# Patient Record
Sex: Male | Born: 1960 | Race: White | Hispanic: No | Marital: Single | Smoking: Current every day smoker
Health system: Southern US, Community
[De-identification: ages and names within clinical notes are randomized; demographics above are authoritative.]

## PROBLEM LIST (undated history)

## (undated) DIAGNOSIS — M1990 Unspecified osteoarthritis, unspecified site: Secondary | ICD-10-CM

## (undated) DIAGNOSIS — J449 Chronic obstructive pulmonary disease, unspecified: Secondary | ICD-10-CM

## (undated) DIAGNOSIS — E785 Hyperlipidemia, unspecified: Secondary | ICD-10-CM

## (undated) DIAGNOSIS — F32A Depression, unspecified: Secondary | ICD-10-CM

## (undated) DIAGNOSIS — F329 Major depressive disorder, single episode, unspecified: Secondary | ICD-10-CM

## (undated) DIAGNOSIS — I10 Essential (primary) hypertension: Secondary | ICD-10-CM

## (undated) DIAGNOSIS — F419 Anxiety disorder, unspecified: Secondary | ICD-10-CM

## (undated) HISTORY — PX: BACK SURGERY: SHX140

## (undated) HISTORY — DX: Chronic obstructive pulmonary disease, unspecified: J44.9

## (undated) HISTORY — DX: Essential (primary) hypertension: I10

## (undated) HISTORY — DX: Hyperlipidemia, unspecified: E78.5

## (undated) HISTORY — PX: NECK SURGERY: SHX720

## (undated) HISTORY — DX: Unspecified osteoarthritis, unspecified site: M19.90

## (undated) HISTORY — PX: ORTHOPEDIC SURGERY: SHX850

---

## 1998-05-29 ENCOUNTER — Emergency Department (HOSPITAL_COMMUNITY): Admission: EM | Admit: 1998-05-29 | Discharge: 1998-05-29 | Payer: Self-pay | Admitting: Emergency Medicine

## 1998-06-29 ENCOUNTER — Emergency Department (HOSPITAL_COMMUNITY): Admission: EM | Admit: 1998-06-29 | Discharge: 1998-06-29 | Payer: Self-pay | Admitting: Emergency Medicine

## 1998-07-01 ENCOUNTER — Emergency Department (HOSPITAL_COMMUNITY): Admission: EM | Admit: 1998-07-01 | Discharge: 1998-07-01 | Payer: Self-pay | Admitting: Emergency Medicine

## 1998-07-29 ENCOUNTER — Emergency Department (HOSPITAL_COMMUNITY): Admission: EM | Admit: 1998-07-29 | Discharge: 1998-07-29 | Payer: Self-pay | Admitting: Emergency Medicine

## 1999-07-05 ENCOUNTER — Emergency Department (HOSPITAL_COMMUNITY): Admission: EM | Admit: 1999-07-05 | Discharge: 1999-07-05 | Payer: Self-pay

## 1999-07-28 ENCOUNTER — Emergency Department (HOSPITAL_COMMUNITY): Admission: EM | Admit: 1999-07-28 | Discharge: 1999-07-28 | Payer: Self-pay

## 1999-08-05 ENCOUNTER — Emergency Department (HOSPITAL_COMMUNITY): Admission: EM | Admit: 1999-08-05 | Discharge: 1999-08-05 | Payer: Self-pay | Admitting: Emergency Medicine

## 1999-08-13 ENCOUNTER — Inpatient Hospital Stay (HOSPITAL_COMMUNITY): Admission: AD | Admit: 1999-08-13 | Discharge: 1999-08-14 | Payer: Self-pay | Admitting: *Deleted

## 1999-08-13 ENCOUNTER — Emergency Department (HOSPITAL_COMMUNITY): Admission: EM | Admit: 1999-08-13 | Discharge: 1999-08-13 | Payer: Self-pay

## 2000-02-29 ENCOUNTER — Emergency Department (HOSPITAL_COMMUNITY): Admission: EM | Admit: 2000-02-29 | Discharge: 2000-03-01 | Payer: Self-pay | Admitting: Emergency Medicine

## 2000-03-03 ENCOUNTER — Emergency Department (HOSPITAL_COMMUNITY): Admission: EM | Admit: 2000-03-03 | Discharge: 2000-03-03 | Payer: Self-pay | Admitting: Emergency Medicine

## 2000-03-04 ENCOUNTER — Encounter: Payer: Self-pay | Admitting: Emergency Medicine

## 2000-06-05 ENCOUNTER — Emergency Department (HOSPITAL_COMMUNITY): Admission: EM | Admit: 2000-06-05 | Discharge: 2000-06-06 | Payer: Self-pay | Admitting: Emergency Medicine

## 2000-06-08 ENCOUNTER — Emergency Department (HOSPITAL_COMMUNITY): Admission: EM | Admit: 2000-06-08 | Discharge: 2000-06-08 | Payer: Self-pay | Admitting: *Deleted

## 2000-06-12 ENCOUNTER — Emergency Department (HOSPITAL_COMMUNITY): Admission: EM | Admit: 2000-06-12 | Discharge: 2000-06-12 | Payer: Self-pay | Admitting: Emergency Medicine

## 2000-10-24 ENCOUNTER — Emergency Department (HOSPITAL_COMMUNITY): Admission: EM | Admit: 2000-10-24 | Discharge: 2000-10-24 | Payer: Self-pay | Admitting: Emergency Medicine

## 2001-03-02 ENCOUNTER — Emergency Department (HOSPITAL_COMMUNITY): Admission: EM | Admit: 2001-03-02 | Discharge: 2001-03-02 | Payer: Self-pay | Admitting: Emergency Medicine

## 2001-05-05 ENCOUNTER — Emergency Department (HOSPITAL_COMMUNITY): Admission: EM | Admit: 2001-05-05 | Discharge: 2001-05-05 | Payer: Self-pay | Admitting: Emergency Medicine

## 2001-05-05 ENCOUNTER — Encounter: Payer: Self-pay | Admitting: Emergency Medicine

## 2001-05-25 ENCOUNTER — Emergency Department (HOSPITAL_COMMUNITY): Admission: EM | Admit: 2001-05-25 | Discharge: 2001-05-25 | Payer: Self-pay

## 2001-06-01 ENCOUNTER — Encounter: Payer: Self-pay | Admitting: Emergency Medicine

## 2001-06-01 ENCOUNTER — Emergency Department (HOSPITAL_COMMUNITY): Admission: EM | Admit: 2001-06-01 | Discharge: 2001-06-02 | Payer: Self-pay | Admitting: Emergency Medicine

## 2001-06-14 ENCOUNTER — Emergency Department (HOSPITAL_COMMUNITY): Admission: EM | Admit: 2001-06-14 | Discharge: 2001-06-14 | Payer: Self-pay | Admitting: Internal Medicine

## 2001-08-05 ENCOUNTER — Emergency Department (HOSPITAL_COMMUNITY): Admission: EM | Admit: 2001-08-05 | Discharge: 2001-08-05 | Payer: Self-pay | Admitting: Emergency Medicine

## 2001-11-06 ENCOUNTER — Emergency Department (HOSPITAL_COMMUNITY): Admission: EM | Admit: 2001-11-06 | Discharge: 2001-11-06 | Payer: Self-pay | Admitting: Emergency Medicine

## 2001-11-12 ENCOUNTER — Emergency Department (HOSPITAL_COMMUNITY): Admission: EM | Admit: 2001-11-12 | Discharge: 2001-11-12 | Payer: Self-pay | Admitting: Emergency Medicine

## 2001-11-13 ENCOUNTER — Encounter: Payer: Self-pay | Admitting: Emergency Medicine

## 2001-11-13 ENCOUNTER — Emergency Department (HOSPITAL_COMMUNITY): Admission: EM | Admit: 2001-11-13 | Discharge: 2001-11-13 | Payer: Self-pay | Admitting: Emergency Medicine

## 2001-11-16 ENCOUNTER — Emergency Department (HOSPITAL_COMMUNITY): Admission: EM | Admit: 2001-11-16 | Discharge: 2001-11-17 | Payer: Self-pay | Admitting: Emergency Medicine

## 2001-11-24 ENCOUNTER — Emergency Department (HOSPITAL_COMMUNITY): Admission: EM | Admit: 2001-11-24 | Discharge: 2001-11-24 | Payer: Self-pay | Admitting: Emergency Medicine

## 2001-11-26 ENCOUNTER — Encounter: Payer: Self-pay | Admitting: Emergency Medicine

## 2001-11-26 ENCOUNTER — Emergency Department (HOSPITAL_COMMUNITY): Admission: EM | Admit: 2001-11-26 | Discharge: 2001-11-27 | Payer: Self-pay | Admitting: Emergency Medicine

## 2002-03-11 ENCOUNTER — Encounter: Payer: Self-pay | Admitting: Emergency Medicine

## 2002-03-11 ENCOUNTER — Emergency Department (HOSPITAL_COMMUNITY): Admission: EM | Admit: 2002-03-11 | Discharge: 2002-03-11 | Payer: Self-pay | Admitting: Emergency Medicine

## 2002-10-29 ENCOUNTER — Emergency Department (HOSPITAL_COMMUNITY): Admission: EM | Admit: 2002-10-29 | Discharge: 2002-10-29 | Payer: Self-pay

## 2002-11-05 ENCOUNTER — Encounter: Payer: Self-pay | Admitting: Emergency Medicine

## 2002-11-05 ENCOUNTER — Emergency Department (HOSPITAL_COMMUNITY): Admission: EM | Admit: 2002-11-05 | Discharge: 2002-11-05 | Payer: Self-pay | Admitting: Emergency Medicine

## 2002-12-01 ENCOUNTER — Encounter: Admission: RE | Admit: 2002-12-01 | Discharge: 2002-12-01 | Payer: Self-pay | Admitting: Cardiology

## 2002-12-01 ENCOUNTER — Encounter: Payer: Self-pay | Admitting: Cardiology

## 2003-01-06 ENCOUNTER — Encounter: Payer: Self-pay | Admitting: Oral Surgery

## 2003-01-06 ENCOUNTER — Ambulatory Visit (HOSPITAL_COMMUNITY): Admission: RE | Admit: 2003-01-06 | Discharge: 2003-01-06 | Payer: Self-pay | Admitting: Oral Surgery

## 2003-02-19 ENCOUNTER — Emergency Department (HOSPITAL_COMMUNITY): Admission: EM | Admit: 2003-02-19 | Discharge: 2003-02-19 | Payer: Self-pay

## 2003-03-02 ENCOUNTER — Inpatient Hospital Stay (HOSPITAL_COMMUNITY): Admission: EM | Admit: 2003-03-02 | Discharge: 2003-03-07 | Payer: Self-pay | Admitting: Emergency Medicine

## 2003-03-02 ENCOUNTER — Encounter: Payer: Self-pay | Admitting: Emergency Medicine

## 2003-03-04 ENCOUNTER — Encounter: Payer: Self-pay | Admitting: Cardiology

## 2003-03-05 ENCOUNTER — Encounter: Payer: Self-pay | Admitting: Cardiovascular Disease

## 2003-03-06 ENCOUNTER — Encounter (INDEPENDENT_AMBULATORY_CARE_PROVIDER_SITE_OTHER): Payer: Self-pay | Admitting: Cardiovascular Disease

## 2003-03-31 ENCOUNTER — Inpatient Hospital Stay (HOSPITAL_COMMUNITY): Admission: EM | Admit: 2003-03-31 | Discharge: 2003-04-03 | Payer: Self-pay | Admitting: *Deleted

## 2003-03-31 ENCOUNTER — Encounter: Payer: Self-pay | Admitting: *Deleted

## 2003-04-01 ENCOUNTER — Encounter: Payer: Self-pay | Admitting: Internal Medicine

## 2003-04-02 ENCOUNTER — Encounter: Payer: Self-pay | Admitting: Pulmonary Disease

## 2003-04-06 ENCOUNTER — Emergency Department (HOSPITAL_COMMUNITY): Admission: EM | Admit: 2003-04-06 | Discharge: 2003-04-06 | Payer: Self-pay | Admitting: Emergency Medicine

## 2003-04-06 ENCOUNTER — Encounter: Payer: Self-pay | Admitting: Emergency Medicine

## 2003-05-01 ENCOUNTER — Inpatient Hospital Stay (HOSPITAL_COMMUNITY): Admission: EM | Admit: 2003-05-01 | Discharge: 2003-05-02 | Payer: Self-pay | Admitting: Emergency Medicine

## 2003-05-27 ENCOUNTER — Encounter: Payer: Self-pay | Admitting: Emergency Medicine

## 2003-05-27 ENCOUNTER — Emergency Department (HOSPITAL_COMMUNITY): Admission: AD | Admit: 2003-05-27 | Discharge: 2003-05-27 | Payer: Self-pay | Admitting: Emergency Medicine

## 2003-06-02 ENCOUNTER — Inpatient Hospital Stay (HOSPITAL_COMMUNITY): Admission: EM | Admit: 2003-06-02 | Discharge: 2003-06-09 | Payer: Self-pay | Admitting: Psychiatry

## 2003-06-23 ENCOUNTER — Encounter: Payer: Self-pay | Admitting: Emergency Medicine

## 2003-06-23 ENCOUNTER — Emergency Department (HOSPITAL_COMMUNITY): Admission: EM | Admit: 2003-06-23 | Discharge: 2003-06-23 | Payer: Self-pay | Admitting: Emergency Medicine

## 2003-06-28 ENCOUNTER — Emergency Department (HOSPITAL_COMMUNITY): Admission: EM | Admit: 2003-06-28 | Discharge: 2003-06-28 | Payer: Self-pay | Admitting: Emergency Medicine

## 2003-06-28 ENCOUNTER — Encounter: Payer: Self-pay | Admitting: Emergency Medicine

## 2003-07-01 ENCOUNTER — Emergency Department (HOSPITAL_COMMUNITY): Admission: EM | Admit: 2003-07-01 | Discharge: 2003-07-01 | Payer: Self-pay | Admitting: Emergency Medicine

## 2003-07-01 ENCOUNTER — Encounter: Payer: Self-pay | Admitting: Emergency Medicine

## 2003-07-02 ENCOUNTER — Emergency Department (HOSPITAL_COMMUNITY): Admission: EM | Admit: 2003-07-02 | Discharge: 2003-07-02 | Payer: Self-pay | Admitting: Emergency Medicine

## 2003-08-26 ENCOUNTER — Emergency Department (HOSPITAL_COMMUNITY): Admission: AD | Admit: 2003-08-26 | Discharge: 2003-08-26 | Payer: Self-pay | Admitting: Emergency Medicine

## 2003-08-26 ENCOUNTER — Encounter: Payer: Self-pay | Admitting: Emergency Medicine

## 2004-02-03 ENCOUNTER — Emergency Department (HOSPITAL_COMMUNITY): Admission: EM | Admit: 2004-02-03 | Discharge: 2004-02-03 | Payer: Self-pay | Admitting: Emergency Medicine

## 2004-02-14 ENCOUNTER — Emergency Department (HOSPITAL_COMMUNITY): Admission: EM | Admit: 2004-02-14 | Discharge: 2004-02-15 | Payer: Self-pay | Admitting: Emergency Medicine

## 2004-02-17 ENCOUNTER — Emergency Department (HOSPITAL_COMMUNITY): Admission: EM | Admit: 2004-02-17 | Discharge: 2004-02-17 | Payer: Self-pay | Admitting: Emergency Medicine

## 2004-03-09 ENCOUNTER — Emergency Department (HOSPITAL_COMMUNITY): Admission: EM | Admit: 2004-03-09 | Discharge: 2004-03-09 | Payer: Self-pay | Admitting: Emergency Medicine

## 2004-03-14 ENCOUNTER — Emergency Department (HOSPITAL_COMMUNITY): Admission: EM | Admit: 2004-03-14 | Discharge: 2004-03-15 | Payer: Self-pay | Admitting: Emergency Medicine

## 2004-03-20 ENCOUNTER — Emergency Department (HOSPITAL_COMMUNITY): Admission: EM | Admit: 2004-03-20 | Discharge: 2004-03-20 | Payer: Self-pay | Admitting: Family Medicine
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
## 2004-04-13 IMAGING — CR DG CHEST 2V
2 series · 2 of 2 positions shown · non-contrast
Comparison: none

CLINICAL DATA: Chest pain, fever.
 TWO-VIEW CHEST, 02/17/04
 Comparison 02/14/04.
 The lungs are clear and the heart and mediastinal structures are normal.
 IMPRESSION 
 No evidence for active chest disease.

[view not recorded (1 of 2)]
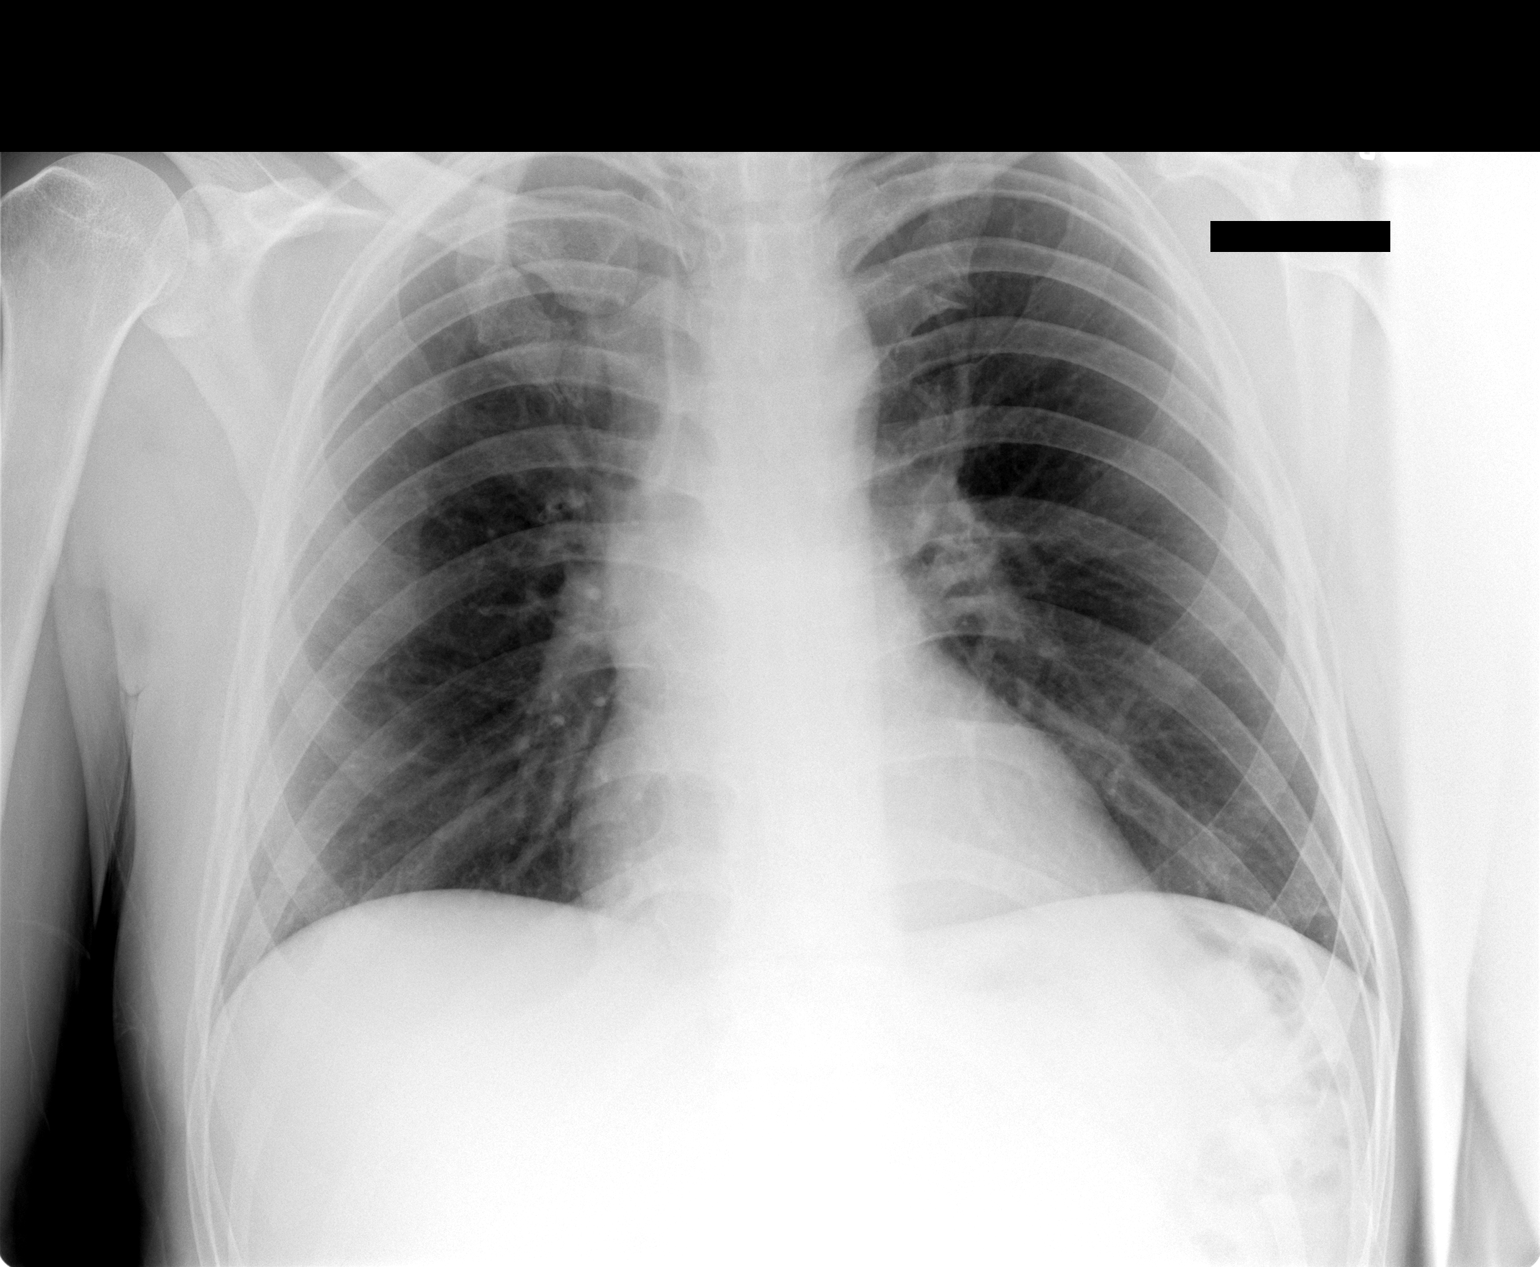

[view not recorded (2 of 2)]
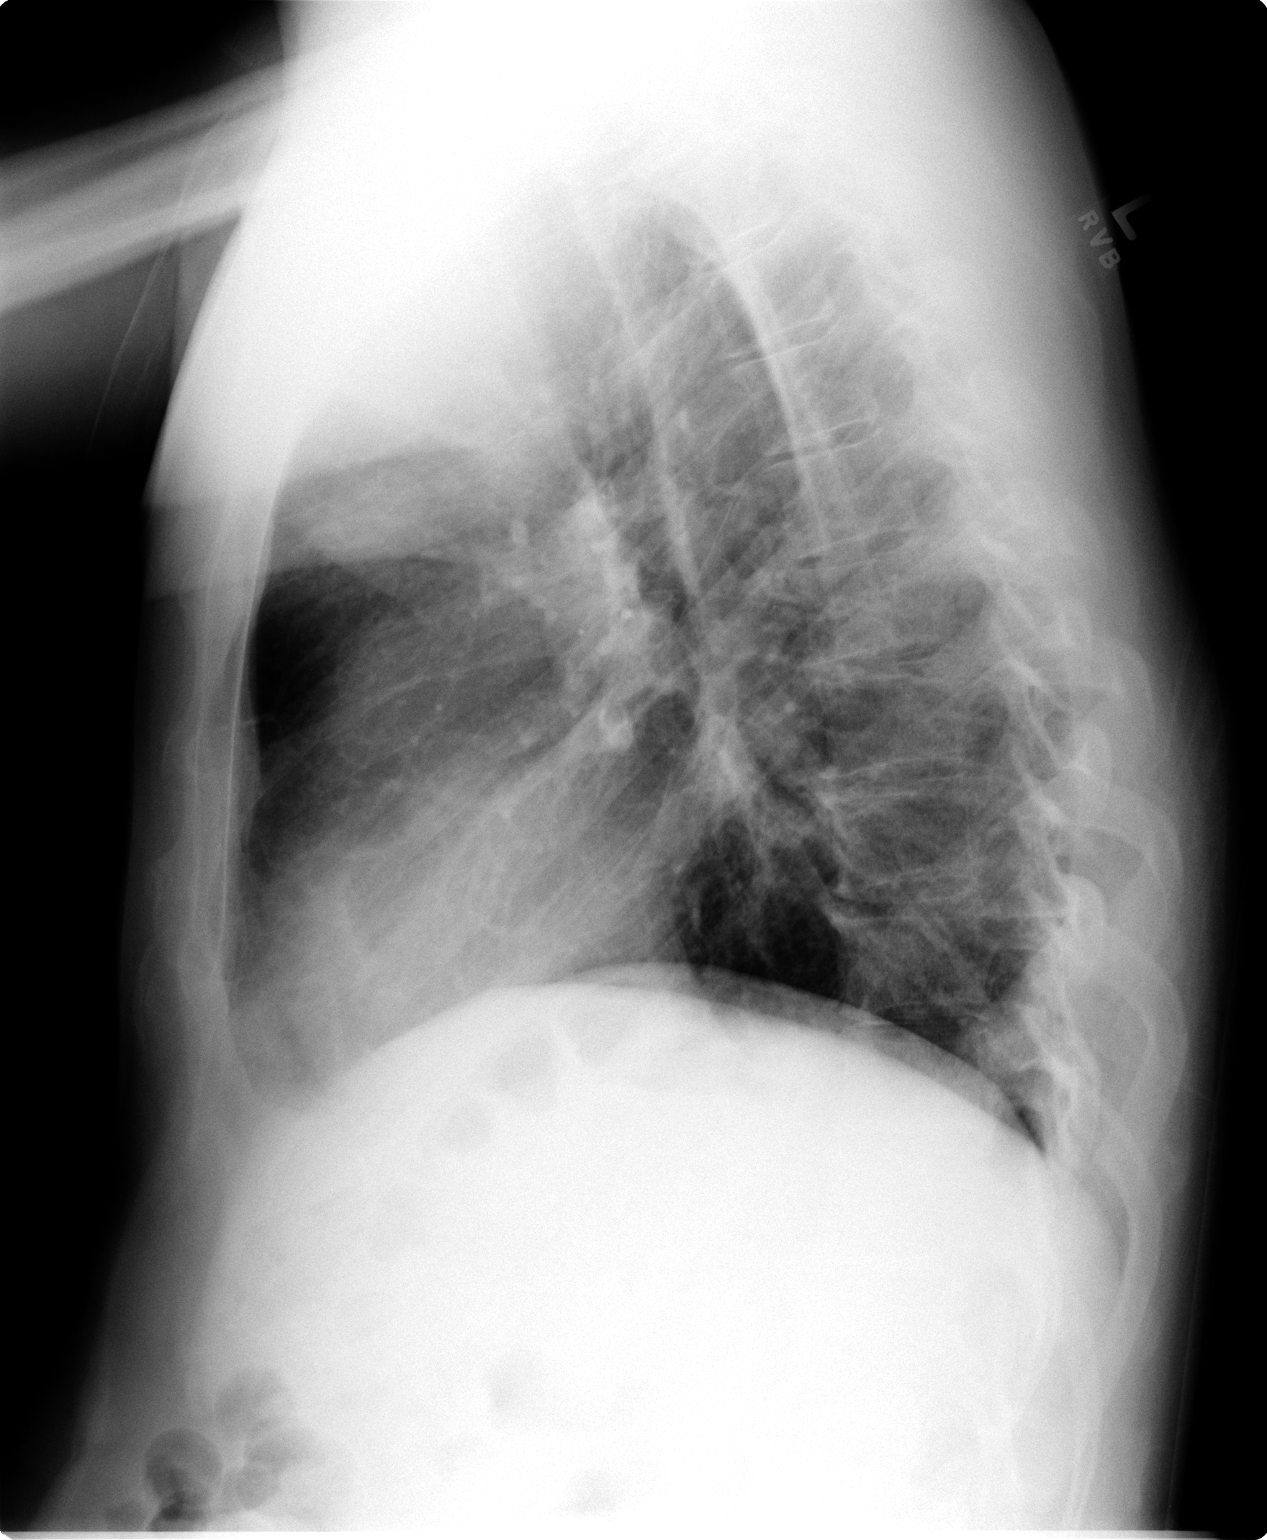

[2 of 2 positions shown; findings below may reference images not displayed]

## 2004-04-30 ENCOUNTER — Inpatient Hospital Stay (HOSPITAL_COMMUNITY): Admission: EM | Admit: 2004-04-30 | Discharge: 2004-05-02 | Payer: Self-pay

## 2004-05-02 ENCOUNTER — Inpatient Hospital Stay (HOSPITAL_COMMUNITY): Admission: RE | Admit: 2004-05-02 | Discharge: 2004-05-13 | Payer: Self-pay | Admitting: Psychiatry

## 2004-05-15 ENCOUNTER — Emergency Department (HOSPITAL_COMMUNITY): Admission: EM | Admit: 2004-05-15 | Discharge: 2004-05-15 | Payer: Self-pay | Admitting: Emergency Medicine

## 2004-07-27 ENCOUNTER — Emergency Department (HOSPITAL_COMMUNITY): Admission: EM | Admit: 2004-07-27 | Discharge: 2004-07-27 | Payer: Self-pay | Admitting: Emergency Medicine

## 2005-03-04 ENCOUNTER — Ambulatory Visit: Payer: Self-pay | Admitting: Gastroenterology

## 2005-03-21 ENCOUNTER — Encounter (INDEPENDENT_AMBULATORY_CARE_PROVIDER_SITE_OTHER): Payer: Self-pay | Admitting: Specialist

## 2005-03-21 ENCOUNTER — Ambulatory Visit (HOSPITAL_COMMUNITY): Admission: RE | Admit: 2005-03-21 | Discharge: 2005-03-21 | Payer: Self-pay | Admitting: Obstetrics and Gynecology

## 2005-07-22 ENCOUNTER — Emergency Department (HOSPITAL_COMMUNITY): Admission: EM | Admit: 2005-07-22 | Discharge: 2005-07-22 | Payer: Self-pay | Admitting: Family Medicine

## 2005-08-30 ENCOUNTER — Emergency Department (HOSPITAL_COMMUNITY): Admission: EM | Admit: 2005-08-30 | Discharge: 2005-08-31 | Payer: Self-pay | Admitting: Emergency Medicine

## 2006-02-15 ENCOUNTER — Emergency Department (HOSPITAL_COMMUNITY): Admission: EM | Admit: 2006-02-15 | Discharge: 2006-02-15 | Payer: Self-pay | Admitting: Emergency Medicine

## 2008-06-08 ENCOUNTER — Ambulatory Visit (HOSPITAL_BASED_OUTPATIENT_CLINIC_OR_DEPARTMENT_OTHER): Admission: RE | Admit: 2008-06-08 | Discharge: 2008-06-08 | Payer: Self-pay | Admitting: Internal Medicine

## 2009-01-07 ENCOUNTER — Emergency Department (HOSPITAL_COMMUNITY): Admission: EM | Admit: 2009-01-07 | Discharge: 2009-01-07 | Payer: Self-pay | Admitting: Emergency Medicine

## 2009-03-27 ENCOUNTER — Emergency Department (HOSPITAL_COMMUNITY): Admission: EM | Admit: 2009-03-27 | Discharge: 2009-03-28 | Payer: Self-pay | Admitting: Emergency Medicine

## 2009-03-28 ENCOUNTER — Ambulatory Visit: Payer: Self-pay | Admitting: Psychiatry

## 2009-03-28 ENCOUNTER — Inpatient Hospital Stay (HOSPITAL_COMMUNITY): Admission: RE | Admit: 2009-03-28 | Discharge: 2009-04-05 | Payer: Self-pay | Admitting: Psychiatry

## 2009-10-07 ENCOUNTER — Emergency Department (HOSPITAL_BASED_OUTPATIENT_CLINIC_OR_DEPARTMENT_OTHER): Admission: EM | Admit: 2009-10-07 | Discharge: 2009-10-07 | Payer: Self-pay | Admitting: Emergency Medicine

## 2010-09-20 ENCOUNTER — Inpatient Hospital Stay (HOSPITAL_COMMUNITY): Admission: EM | Admit: 2010-09-20 | Discharge: 2010-09-22 | Payer: Self-pay | Admitting: Emergency Medicine

## 2011-02-05 LAB — SALICYLATE LEVEL: Salicylate Lvl: <4 mg/dL (ref 2.8–20.0)

## 2011-02-05 LAB — ACETAMINOPHEN LEVEL: Acetaminophen (Tylenol), Serum: <10 ug/mL — ABNORMAL LOW (ref 10–30)

## 2011-02-05 LAB — URINALYSIS, ROUTINE W REFLEX MICROSCOPIC
Bilirubin Urine: NEGATIVE
Hgb urine dipstick: NEGATIVE
Specific Gravity, Urine: 1.01 (ref 1.005–1.030)
Urobilinogen, UA: 0.2 mg/dL (ref 0.0–1.0)

## 2011-02-05 LAB — ETHANOL: Alcohol, Ethyl (B): 81 mg/dL — ABNORMAL HIGH (ref 0–10)

## 2011-02-05 LAB — POCT I-STAT, CHEM 8
BUN: 7 mg/dL (ref 6–23)
Calcium, Ion: 1.11 mmol/L — ABNORMAL LOW (ref 1.12–1.32)
Chloride: 104 mEq/L (ref 96–112)
Glucose, Bld: 94 mg/dL (ref 70–99)
Potassium: 4.3 mEq/L (ref 3.5–5.1)
Sodium: 138 mEq/L (ref 135–145)

## 2011-02-05 LAB — CARDIAC PANEL(CRET KIN+CKTOT+MB+TROPI)
CK, MB: 54.4 ng/mL (ref 0.3–4.0)
Relative Index: 0.6 (ref 0.0–2.5)
Relative Index: 1 (ref 0.0–2.5)
Troponin I: <0.01 ng/mL (ref 0.00–0.06)

## 2011-02-05 LAB — BASIC METABOLIC PANEL
BUN: 9 mg/dL (ref 6–23)
CO2: 26 mEq/L (ref 19–32)
Calcium: 8.3 mg/dL — ABNORMAL LOW (ref 8.4–10.5)
Creatinine, Ser: 0.76 mg/dL (ref 0.4–1.5)
GFR calc Af Amer: >60 mL/min (ref >60)
GFR calc non Af Amer: >60 mL/min (ref >60)
Sodium: 144 mEq/L (ref 135–145)

## 2011-02-05 LAB — POCT CARDIAC MARKERS
CKMB, poc: 34.1 ng/mL (ref 1.0–8.0)
Myoglobin, poc: >500 ng/mL (ref 12–200)

## 2011-02-05 LAB — CBC
Hemoglobin: 13.8 g/dL (ref 13.0–17.0)
MCV: 93.4 fL (ref 78.0–100.0)
Platelets: 190 10*3/uL (ref 150–400)
WBC: 8.1 10*3/uL (ref 4.0–10.5)

## 2011-02-05 LAB — COMPREHENSIVE METABOLIC PANEL
AST: 78 U/L — ABNORMAL HIGH (ref 0–37)
Albumin: 3.3 g/dL — ABNORMAL LOW (ref 3.5–5.2)
BUN: 10 mg/dL (ref 6–23)
Calcium: 8.5 mg/dL (ref 8.4–10.5)
GFR calc non Af Amer: >60 mL/min (ref >60)
Sodium: 139 mEq/L (ref 135–145)

## 2011-02-05 LAB — RAPID URINE DRUG SCREEN, HOSP PERFORMED
Barbiturates: NOT DETECTED
Tetrahydrocannabinol: NOT DETECTED

## 2011-03-04 LAB — DIFFERENTIAL
Basophils Absolute: 0 10*3/uL (ref 0.0–0.1)
Basophils Relative: 1 % (ref 0–1)
Eosinophils Relative: 1 % (ref 0–5)
Monocytes Absolute: 0.6 10*3/uL (ref 0.1–1.0)

## 2011-03-04 LAB — COMPREHENSIVE METABOLIC PANEL
AST: 62 U/L — ABNORMAL HIGH (ref 0–37)
Albumin: 3.8 g/dL (ref 3.5–5.2)
Alkaline Phosphatase: 66 U/L (ref 39–117)
BUN: 1 mg/dL — ABNORMAL LOW (ref 6–23)
CO2: 21 mEq/L (ref 19–32)
Chloride: 105 mEq/L (ref 96–112)
GFR calc Af Amer: >60 mL/min (ref >60)
Potassium: 4 mEq/L (ref 3.5–5.1)
Total Bilirubin: 0.6 mg/dL (ref 0.3–1.2)

## 2011-03-04 LAB — URINALYSIS, ROUTINE W REFLEX MICROSCOPIC
Bilirubin Urine: NEGATIVE
Bilirubin Urine: NEGATIVE
Glucose, UA: NEGATIVE mg/dL
Specific Gravity, Urine: 1.001 — ABNORMAL LOW (ref 1.005–1.030)
Specific Gravity, Urine: 1.024 (ref 1.005–1.030)
Urobilinogen, UA: 0.2 mg/dL (ref 0.0–1.0)
pH: 6 (ref 5.0–8.0)
pH: 6.5 (ref 5.0–8.0)

## 2011-03-04 LAB — URINE MICROSCOPIC-ADD ON

## 2011-03-04 LAB — CBC
HCT: 46.4 % (ref 39.0–52.0)
Platelets: 180 10*3/uL (ref 150–400)
RBC: 5.01 MIL/uL (ref 4.22–5.81)
WBC: 5.8 10*3/uL (ref 4.0–10.5)

## 2011-03-04 LAB — ETHANOL: Alcohol, Ethyl (B): 311 mg/dL — ABNORMAL HIGH (ref 0–10)

## 2011-03-04 LAB — RAPID URINE DRUG SCREEN, HOSP PERFORMED: Benzodiazepines: POSITIVE — AB

## 2011-03-11 LAB — URINALYSIS, ROUTINE W REFLEX MICROSCOPIC
Glucose, UA: NEGATIVE mg/dL
Ketones, ur: NEGATIVE mg/dL
Protein, ur: NEGATIVE mg/dL

## 2011-03-11 LAB — RAPID URINE DRUG SCREEN, HOSP PERFORMED
Amphetamines: NOT DETECTED
Barbiturates: NOT DETECTED
Benzodiazepines: POSITIVE — AB

## 2011-03-11 LAB — POCT I-STAT, CHEM 8
Calcium, Ion: 0.98 mmol/L — ABNORMAL LOW (ref 1.12–1.32)
Glucose, Bld: 113 mg/dL — ABNORMAL HIGH (ref 70–99)
HCT: 48 % (ref 39.0–52.0)
Hemoglobin: 16.3 g/dL (ref 13.0–17.0)
Potassium: 3.5 mEq/L (ref 3.5–5.1)
TCO2: 20 mmol/L (ref 0–100)

## 2011-04-08 NOTE — H&P (Signed)
NAME:  Jason Bean, Jason Bean NO.:  [id number]   MEDICAL RECORD NO.:  [id number]          PATIENT TYPE:  IPS   LOCATION:  0507                          FACILITY:  [facility]   PHYSICIAN:  Geoffery Lyons, M.D.      DATE OF BIRTH:  [birth year]   DATE OF ADMISSION:  03/28/2009  DATE OF DISCHARGE:                       PSYCHIATRIC ADMISSION ASSESSMENT   HISTORY OF PRESENT ILLNESS:  The patient reports a history of alcohol  use, been drinking a lot with reporting up to a case per day.  Also a  history of seizures a couple months ago, related to alcohol withdrawal.  Last use of alcohol was a day prior to admission.  He does report some  depression and suicidal thoughts.  The patient's stressor is he is  currently homeless.   PAST PSYCHIATRIC HISTORY:  The patient was here in 2005 with a history  of crack and methamphetamine use.  He reports a history bipolar  disorder, although not currently on any medications at this time.   SOCIAL HISTORY:  The patient is, again, a 50 year old single male,  homeless, currently on disability, has a ninth-grade education.   FAMILY HISTORY:  Unknown.   ALCOHOL AND DRUG HISTORY:  Again the patient has been drinking up to  case a day with last drink day prior to admission.  History of seizures.  No other recreational drug use.   PRIMARY CARE Akirah Storck:  None.   MEDICAL PROBLEMS:  Again, seizure disorder and hypertension.   MEDICATIONS:  Listed as lisinopril 10 mg and Xanax.   DRUG ALLERGIES:  CODEINE and RISPERDAL.   PHYSICAL FINDINGS:  This is a well-nourished, middle-aged male, assessed  at [facility] Emergency Department.  Noted the patient initially had  decreased O2 sats of 88-92, had some wheezing noted, concern for  aspiration.  Chest x-ray was within normal limits.  Also noted to have  slurred speech, intoxicated breath, smelling of alcohol.  He did receive  a banana bag.  Again, other pertinent symptoms, the patient became angry  and  ripped out his IV.  He was also unable to maintain any conversation  without falling asleep.  His temperature is 98.5, 106 heart rate, 20  respirations, blood pressure is 118/77.  The patient presented to the  unit very sleepy, but today awakens very readily and with good eye-  contact, able to provide some history.  He denies any tremors or any  other physical complaints.   LABORATORY DATA:  Shows acetaminophen level is less than 10.  Alcohol  level was 311.  Urine drug screen positive for cocaine, positive for  benzodiazepines.  Urinalysis shows WBC 11-20.  Chest x-ray was within  normal limits.  SGOT is mildly elevated at 62.   MENTAL STATUS EXAM:  Again, the patient is resting in bed.  He easily  awakens and has good eye-contact.  Speech is soft-spoken.  Mood is  depressed.  The patient's affect is flat.  Thought processes are  coherent.  No delusional statements.  His cognitive function is intact.  Again memory appears intact.  Judgment and insight are limited.  AXIS I:  Substance-induced mood disorder.  Alcohol abuse, rule out  dependence.  Polysubstance abuse.  AXIS II:  Deferred.  AXIS III:  Seizure disorder and hypertension.  AXIS IV:  Problems with housing, psychosocial problems, chronic  substance use and lack of social support.  AXIS V:  Current is 35.   PLAN:  Our plan is to put patient on Librium protocol, monitor  withdrawal symptoms.  We will put patient on seizure precautions.  Will  work on relapse prevention.  Case manager will assess rehab programs  available to the patient.  Will continue to assess comorbidities.  The  patient may benefit from an antidepressant.  His tentative length of  stay at this time is 4-6 days.      Landry Corporal, N.P.      Geoffery Lyons, M.D.  Electronically Signed    JO/MEDQ  D:  03/28/2009  T:  03/28/2009  Job:  [id number]

## 2011-04-11 NOTE — Discharge Summary (Signed)
NAME:  Jason Bean, Jason Bean NO.:  [id number]   MEDICAL RECORD NO.:  [id number]          PATIENT TYPE:  IPS   LOCATION:  0507                          FACILITY:  [facility]   PHYSICIAN:  Geoffery Lyons, M.D.      DATE OF BIRTH:  04-04-1961   DATE OF ADMISSION:  03/28/2009  DATE OF DISCHARGE:  04/05/2009                               DISCHARGE SUMMARY   CHIEF COMPLAINT:  This was the second admission to [facility] for 50 year old male that was admitted with history of alcohol  dependence, endorsed drinking a lot, reporting up to a case per day,  history of seizures related to alcohol withdrawal, last use of alcohol  the day before the admission.  Endorsed some depression and thoughts of  suicide.  He was homeless, financial difficulties, cannot keep a place  to leave.  He hears big birds and weird things sometimes at night, also  hears people talking when they are not there.   PAST PSYCHIATRIC HISTORY:  Was in Behavior Health in 2005 with a history  of crack and methamphetamine use.  Has been diagnosed bipolar, on no  medication.   ALCOHOL AND DRUG HISTORY:  As already stated, persistent use of alcohol  and withdrawal seizures and past history of crack and methamphetamine  abuse.   MEDICAL HISTORY:  Hypertension, seizure upon withdrawal.   MEDICATIONS:  Claims takes lisinopril and Xanax.   PHYSICAL EXAMINATION:  Failed to show any acute findings.   LABORATORY WORKUP:  Alcohol level 311.  UDS positive for cocaine and  benzodiazepines.  White blood cells 11-20.  Chest x-ray was within  normal limits.  SGOT 62.   MENTAL STATUS EXAM:  Reveals alert cooperative male in bed, easily  awakened, good eye contact.  Speech is soft spoken, was depressed,  affect constricted.  Thought processes are clear, coherent.  No suicidal  or homicidal ideas, no delusions, no hallucinations.  Cognition well  preserved.   ADMISSION DIAGNOSES:  AXIS I:  Alcohol dependence.   Cocaine abuse, rule  out substance-induced mood disorder.  AXIS II:  No diagnosis.  AXIS III:  Seizure upon withdrawal.  Hypertension.  AXIS IV:  Moderate.  AXIS V:  On admission 35; highest GAF in last year 50.   COURSE IN THE HOSPITAL:  Was admitted, started in individual and group  psychotherapy.  Detoxified with Librium.  He was given some Neurontin,  eventually Neurontin was discontinued.  As already stated, a 50 year old  male, history of alcohol dependence, increased use up to 12 beers a day.  May 6, still detoxing, lightheaded, uncertain as far as where he was  going to go from the hospital.  He has used benzodiazepines, Paxil,  Zoloft, Lexapro, Celexa, Prozac, Cymbalta.  Endorsed decreased sleep,  decreased appetite, anxiety.  Gave him a trial with Neurontin.  He  admitted that he had been using a lot Xanax for sleep.  We increased the  Neurontin, gave him some trazodone.  May 9, he had slept better.  May  10, he was feeling nauseated, shaky, as when he came  in, and we ruled  out the possibility of side effects from the Neurontin, so we extended  the Librium and we stopped the Neurontin.  May 11, he was better,  decreasing tremulousness, decreasing the shaky feeling.  We pursued  medications.  We continued to detox with Librium.  He was stabilized at  25 three times a day.  He felt much better.  Stopped the withdrawal.  The assessment was that probably was going to have to be detoxed on a  longer term basis from benzodiazepines.  May 13, obtained full benefit  from hospitalization.  He was going to go to a [employer] and  continue to work towards getting to [place] where he was going to be  able to secure a permanent placement.  Meanwhile, he was going to be  seen at [facility].   DISCHARGE DIET:  AXIS I:  Alcohol dependence.  Alcohol withdrawal.  Benzodiazepine dependence.  Benzodiazepine withdrawal.  Anxiety disorder  not otherwise  specified.  Substance-induced depression.  AXIS II:  No diagnosis.  AXIS III:  Withdrawal seizures.  Hypertension.  AXIS IV:  Moderate.  AXIS V:  On discharge 50-55.   Discharged on Librium 25 one 3 times a day for 4 days; then Librium 25  one twice a day for 4 days; then Librium 25 one daily for 4 days.   FOLLOW-UP:  Through [facility].      Geoffery Lyons, M.D.  Electronically Signed     IL/MEDQ  D:  04/26/2009  T:  04/26/2009  Job:  [id number]

## 2011-04-11 NOTE — H&P (Signed)
NAME:  Jason Bean, Jason NO.:  [id number]   MEDICAL RECORD NO.:  [id number]                   PATIENT TYPE:  IPS   LOCATION:  0503                                 FACILITY:  [facility]   PHYSICIAN:  Geoffery Lyons, M.D.                   DATE OF BIRTH:  June 23, 1961   DATE OF ADMISSION:  06/02/2003  DATE OF DISCHARGE:                         PSYCHIATRIC ADMISSION ASSESSMENT   TYPE OF ADMISSION:  This is an involuntary commitment.  This is a 50-year-  old white male.  He was admitted involuntarily for alcohol dependence,  requesting detoxification.  He also reported depression with suicidal  thoughts and a plan to cut his wrist and walk in front of a car.  He is now  drinking five to six 40 ounce beers daily and using crack.  Review of his  hospital record indicates that he was admitted to [facility] on April 8, was  discharged on March 08, 2003 of this year.  He had been seen in the  emergency room complaining of agitation because he had been on an alcohol  binge.  He was treated with Xanax and Klonopin and found to have stimulant  poisoning and toxic effect, cocaine abuse, alcohol abuse, and generalized  anxiety.  He also complained of GI problems at that time.  An upper GI study  was obtained which revealed no issues and he was evaluated by the behavioral  health team.  He was agreeable to go into the residential polysubstance  abuse treatment program at [facility] in [place] and when  discharged on March 07, 2003 from the hospital, he was to go to rehab.  At  the time of his discharge in April, he was on Cardizem CD 120 mg one p.o.  q.d., Protonix 40 mg a day, and Xanax 0.5 t.i.d.  The patient was readmitted  on Mar 31, 2003, status post alcohol and benzodiazepine overdose.  He  suffered respiratory failure secondary to this.  He was intubated and weaned  off the ventilator on the 8th.  He was seen by Dr. Jeanie Sewer in consult who  felt that  although Mr. Pape had a significant substance abuse problem, he  was able to go home.  The patient also reported that he had a panic disorder  and that he should go home with a small amount of benzodiazepine.  At no  time was the patient suicidal and hence he was discharged on May 10th.  His  immediate issue today is that he is homeless.  He has no place to go and he  has continued to abuse alcohol and crack cocaine.   PAST PSYCHIATRIC HISTORY:  He acknowledges several prior admissions to [facility].   SOCIAL HISTORY:  He completed 9th grade.  He has worked in Aeronautical engineer.  He  never married, but he does have a 44 year old daughter.  His father  died two  years ago.  Initially the patient inhabited that house until he was evicted  due to foreclosure.  He was going to live with his mother, however there is  not enough bedrooms at his mother's apartment.  He was not allowed to stay  and hence he has been homeless the past 2 1/2 weeks.   FAMILY HISTORY:  He denies any history for mental illness.   ALCOHOL AND DRUG HISTORY:  He has an extensive and significant polysubstance  abuse problem.   MEDICAL HISTORY AND PRIMARY CARE Ronelle Smallman:  He used to see a Dr. Jettie Pagan  who is no longer in practice.  He has seen Dr. Donia Guiles, cardiologist,  the last two inpatient admissions.  Currently he is not taking any  medication.   DRUG ALLERGIES:  No known drug allergies.   POSITIVE PHYSICAL FINDINGS:  He does have a mild tremor bilaterally.  HEENT:  Was within normal limits.  He does need to have some dental care.  LUNGS:  Clear to auscultation and percussion.  HEART:  Revealed a regular rate and rhythm without murmurs, rubs, or  gallops.  ABDOMEN:  Soft with no hepatosplenomegaly.  MUSCULOSKELETAL:  Reveals no clubbing, cyanosis, or edema.  NEUROLOGIC:  Cranial nerves II-XII are intact.  He has no significant  alteration in his gait.   MENTAL STATUS EXAM:  Shows he is alert  and oriented x3.  He is unkempt,  although cooperative.  His speech had a normal rate, rhythm, and tone.  His  mood is irritable.  His thought processes are coherent.  Specifically, he  requests continued prescription of his benzodiazepine for his anxiety.  Concentration and memory are fair.  Judgment and insight are poor.  His  intelligence level is average.   DIAGNOSES:   AXIS I:  1. Polysubstance abuse and dependence.  2. Anxiety disorder per history.   AXIS II:  Rule out personality disorder.   AXIS III:  Recent respiratory failure secondary to alcohol and benzo  overdose.   AXIS IV:  Severe.   AXIS V:  35.   PLAN:  To support through withdrawal using the low dose Librium protocol.  He will be directly admitted to a residential treatment for polysubstance  abuse after his detox is complete.  Significantly, his alcohol level on  arrival was 351.     Mickie Leonarda Salon, P.A.-C.               Geoffery Lyons, M.D.    MD/MEDQ  D:  06/04/2003  T:  06/04/2003  Job:  [id number]

## 2011-04-11 NOTE — Discharge Summary (Signed)
NAME:  Jason Bean, Jason Bean NO.:  [id number]   MEDICAL RECORD NO.:  [id number]                   PATIENT TYPE:  IPS   LOCATION:  0503                                 FACILITY:  [facility]   PHYSICIAN:  Jeanice Lim, M.D.              DATE OF BIRTH:  Jan 07, 1961   DATE OF ADMISSION:  06/02/2003  DATE OF DISCHARGE:  06/09/2003                                 DISCHARGE SUMMARY   IDENTIFYING DATA:  This is a 50 year old single Caucasian male admitted  involuntarily for alcohol dependence, requesting detox.  Also, depressed  with suicidal thoughts and plan to cut wrist or walk in front of a car.  Homeless.  Drinking five to six 40-ounce beers daily.  Also using crack  cocaine.   MEDICATIONS:  None.   ALLERGIES:  No known drug allergies.   PHYSICAL EXAMINATION:  GENERAL:  Physical exam is essentially within normal  limits.  NEUROLOGICAL:  Nonfocal.  Patient was tremulous.   MENTAL STATUS EXAM:  Alert and oriented, unkempt, cooperative.  Speech  within normal limits.  Mood irritable, dysphoric.  Affect restricted.  Thought process goal-directed.  Thought content negative for psychotic  symptoms, possible suicidal thoughts.  Cognitively intact.  Judgment and  insight poor to limited.   ADMISSION DIAGNOSES:   AXIS I:  1. Polysubstance abuse.  2. Anxiety disorder, not otherwise specified.   AXIS II:  Deferred.   AXIS III:  Recent respiratory failure secondary to alcohol and  benzodiazepine overdose.   AXIS IV:  Moderate problems with primary support group, educational,  occupation, housing, economic stressors, and legal problems.   AXIS V:  30/55.   HOSPITAL COURSE:  Patient was admitted, ordered routine p.r.n. medications,  underwent further monitoring, was encouraged to participate in the  individual, group, and milieu therapy.  Patient wanted residential substance  abuse treatment and was monitored for detox and withdrawal.  Patient  reported  responding to Klonopin, Seroquel p.r.n.  Seroquel was optimized,  Risperdal ordered for severe panic, and Trileptal and Seroquel were  optimized.  Patient has been placed on low-dosed Librium detox protocol.  Risperdal and Neurontin were discontinued, and patient reported a positive  response to medication changes, participated in after-care planning.   CONDITION AT DISCHARGE:  Markedly improved.  Mood was more euthymic, affect  brighter, thought process goal-directed, thought content negative for  dangerous ideation or psychotic symptoms.  Patient reported motivation to be  compliant with the after-care plan, showed improved judgment and insight.  Patient was discharged on medications.   DISCHARGE MEDICATIONS:  1. Seroquel 25 mg two tabs three times per day and 300 mg q.h.s.  2. Trileptal 300 mg one-half three times per day and one q.h.s.  3. Trazodone 100 mg q.h.s. p.r.n.  4. Librium 25 mg b.i.d. and two q.h.s.   FOLLOW UP:  1. Patient is to follow up on Monday, June 26, 2003, at  5 p.m. with Donnie Aho.  2. [facility] on June 13, 2003, at 6 p.m. for medication     management.   DISCHARGE DIAGNOSES:   AXIS I:  1. Polysubstance abuse.  2. Anxiety disorder, not otherwise specified.   AXIS II:  Deferred.   AXIS III:  Recent respiratory failure secondary to alcohol and  benzodiazepine overdose.   AXIS IV:  Moderate problems with primary support group, educational,  occupation, housing, economic stressors, and legal problems.   AXIS V:  Global assessment of functioning on discharge was 55.                                               Jeanice Lim, M.D.    JEM/MEDQ  D:  06/26/2003  T:  06/27/2003  Job:  [id number]

## 2011-04-11 NOTE — Discharge Summary (Signed)
NAME:  AARIK, BLANK NO.:  [id number]   MEDICAL RECORD NO.:  [id number]                   PATIENT TYPE:  INP   LOCATION:  4733                                 FACILITY:  [facility]   PHYSICIAN:  Osvaldo Shipper. Spruill, M.D.             DATE OF BIRTH:  03/05/1961   DATE OF ADMISSION:  03/02/2003  DATE OF DISCHARGE:  03/07/2003                                 DISCHARGE SUMMARY   DISCHARGE DIAGNOSES:  1. Stimulant poisoning and toxic effect.  2. Cocaine abuse.  3. Alcohol abuse.  4. Chest pain.  5. Tobacco use.  6. Generalized anxiety.   HISTORY OF PRESENT ILLNESS:  This is a 50 year old patient who complained of  chest pain.  Stated he awakened with this particular problem.  He had been  smoking crack for the last five days.  He complained of being shaky.  He  states he has also been on an alcohol binge.  The patient was seen initially  in the emergency department of the [facility].  He received Ativan  IV for assistance with these problems.  His evaluation revealed a normal  CBC, an alcohol level that was less than 5, a CK that was normal at 158 with  an MB normal at 0.7.  He had a troponin that was normal at 0.01.  His urine  drug screen revealed benzodiazepine.  The patient was subsequently admitted  for evaluation of the chest pain and assistance with the polysubstance  abuse.   The patient was treated with Xanax and Klonopin.  He had an adenosine  Cardiolite study done.  His electrocardiograms revealed a normal sinus  rhythm with sinus arrhythmia, some ST abnormality.  The adenosine Cardiolite  study revealed an ejection fraction of 51%.  No perfusion defect was noted.  The patient also underwent a transthoracic 2-D echocardiogram which revealed  left ventricular systolic function to be normal.  This test also projected  the ejection fraction to be 55%-65%.  There was mild calcification of the  aortic valve and very mild aortic valve  stenosis.  There was also some mild  regurgitation in the aortic area and mild mitral regurgitation.  No other  significant problems were noted.   The patient was also seen by the ACT team.  The patient related to them that  he was interested in going to a residential setting for polysubstance abuse  and treatment.  The various options were discussed with the patient.  The  patient was referred to behavioral health.   The patient also complained of some GI-related problems.  An upper GI study  was obtained which revealed no obstructing lesions or mass effect or ulcer.  There was noted some minimal reflux present and no hiatal hernia.   The behavioral health team was able to be in touch with the [facility] in [place].  The patient was discharged on March 07, 2003.  The  patient is to follow up with the physician in [place] for his drug  rehab.  He is to be followed very closely as an outpatient.  He is to be  seen in the office in two weeks.  Plans are still underway to assist the  patient in obtaining rehab for his polysubstance abuse.   DISCHARGE MEDICATIONS:  1. Cardizem CD 120 one daily.  2. Protonix 40 mg daily.  3. Xanax 0.5 mg three times daily.   DISCHARGE INSTRUCTIONS:  He is to notify the physician immediately with any  changes or problems or concerns.     Ivery Quale, P.A.                       Osvaldo Shipper. Spruill, M.D.    HB/MEDQ  D:  05/04/2003  T:  05/05/2003  Job:  [id number]

## 2011-04-11 NOTE — Discharge Summary (Signed)
   NAME:  Jason Bean, Jason Bean NO.:  [id number]   MEDICAL RECORD NO.:  [id number]                   PATIENT TYPE:  INP   LOCATION:  3032                                 FACILITY:  [facility]   PHYSICIAN:  Lazaro Arms, M.D.        DATE OF BIRTH:  [birth year]   DATE OF ADMISSION:  03/31/2003  DATE OF DISCHARGE:  04/03/2003                                 DISCHARGE SUMMARY   PRIMARY CARE PHYSICIAN:  Osvaldo Shipper. Spruill, M.D.   DISCHARGE DIAGNOSES:  1. Status post alcohol and benzodiazepine overdose.  2. Status post respiratory failure secondary to #1.  3. History of polysubstance abuse.  4. History of noncardiac chest pain.   BRIEF HISTORY OF PRESENT ILLNESS:  The patient is a 50 year old gentleman  who presented to [facility] [place] with a loss of consciousness.  He  admitted to taking Xanax, Ativan, crack cocaine and a large amount of  alcohol.  He was intubated due to respiratory depression.  He was initially  put into the ICU.  Critical care medicine was consulted and he was quickly  weaned off of the ventilator by Apr 01, 2003.  The patient was then  transferred to the floor on suicide precautions and was placed on a tapering  schedule of Librium.  He remained without any signs or symptoms of  benzodiazepine or alcohol withdrawal.  On Apr 03, 2003, he was seen by Antonietta Breach, M.D., of psychiatry who felt that he had a significant substance  abuse problem but that he was safe to go home.  He said that he also had a  panic disorder and so he should go home with a small amount of  benzodiazepine.  At no time during this hospitalization was the patient  suicidal, although he was placed on suicide precautions until he was seen by  Antonietta Breach, M.D.  The patient was discharged home in stable condition  Apr 03, 2003.  He was febrile, pulse 73, blood pressure 100 systolic.                                               Lazaro Arms,  M.D.    AMC/MEDQ  D:  04/03/2003  T:  04/04/2003  Job:  [id number]   cc:   Osvaldo Shipper. Spruill, M.D.  [address] [address]  [place]  [place] [address]  Fax: [phone]

## 2011-04-11 NOTE — Discharge Summary (Signed)
NAME:  Jason Bean, Jason Bean                         ACCOUNT NO.:  [id number]   MEDICAL RECORD NO.:  [id number]                   PATIENT TYPE:  INP   LOCATION:  4703                                 FACILITY:  [facility]   PHYSICIAN:  C. Ulyess Mort, M.D.             DATE OF BIRTH:  Jun 24, 1961   DATE OF ADMISSION:  04/30/2004  DATE OF DISCHARGE:  05/01/2004                                 DISCHARGE SUMMARY   DISCHARGE DIAGNOSES:  1. Chest pain secondary to drug abuse.  2. Polysubstance abuse including cocaine, opiates, and methamphetamine.  3. Depression with suicidal ideation.  4. Tobacco abuse.  5. Likely gastroesophageal reflux disease.   DISCHARGE MEDICATIONS:  1. Klonopin 1 mg p.o. t.i.d.  2. Thiamine 100 mg p.o. daily.  3. Folic acid 1 mg p.o. daily.  4. Protonix 40 mg p.o. daily.  5. Lexapro 10 mg p.o. daily.  6. Nicoderm CQ 21 mg patch transdermal to be changed every day.  7. Tylenol 650 mg p.o. q.4h. p.r.n. headache or pain.   HISTORY OF PRESENT ILLNESS:  Jason Bean is a 50 year old Caucasian male  with a significant past medical history  of psychiatric disease and  polysubstance abuse who presented to the emergency department with  complaints of burning chest pain radiating down his right arm.  The patient  does report some diaphoresis and shortness of breath with the chest pain;  however, that had since subsided by his presentation to the ED.  The patient  admits to using methamphetamine and cocaine prior to his chest pain.  In  addition, the patient reports drinking a significant amount of alcohol prior  to his onset of chest pain.  The patient reports that he was attempting to  kill himself by using the drugs. According to the patient, this is his third  suicide attempt.  Prior suicide attempts were by slitting his wrists and  taking pills.   Upon presentation to the ED, the patient's chest pain was substernal and  nonradiating.  It was not associated with nausea,  vomiting, diaphoresis, or  shortness of breath.  The patient was thus admitted to rule out a myocardial  infarction or cardiac origin of his chest pain and to be cleared for medical  stabilization prior to transfer for psychiatric help.   Of note, the patient does indicate a history of withdrawal, presumably from  alcohol and/or opiates, and has a history of seizures with his withdrawal.   PHYSICAL EXAMINATION:  VITAL SIGNS:  Temperature 98.2, blood pressure 94.69,  pulse 99, O2 saturation 95% on room air, respiratory rate 20.  GENERAL:  The patient is rather disheveled appearing but appropriate  interaction.  HEENT:  Nonicteric sclerae.  Pupils equal, round, and reactive to light.  Non-injected conjunctivae.  Oropharynx clear with somewhat dry mucous  membranes.  NECK:  Supple.  No thyromegaly, no lymphadenopathy, no JVD.  CHEST:  Clear to auscultation bilaterally with  good air movement.  CARDIOVASCULAR:  Regular rate and rhythm with normal S1 and S2.  ABDOMEN:  Tender right upper quadrant but soft and nondistended with some  decreased bowel sounds.  EXTREMITIES:  Normal capillary refill with no clubbing, cyanosis, or edema.  SKIN:  Warm and dry.  NEUROLOGIC:  Nonfocal.  PSYCHIATRIC:  Somewhat depressed and flat affect; however, the patient  provides appropriate and forthcoming responses to all questions.   LABORATORY AND X-RAY DATA:  1. Chest x-ray revealed no acute disease.  2. Cardiac enzymes were drawn q.8h. x 3 to rule out acute myocardial     infarction.  The patient's CK-MB and CK were all within normal limits.     The patient's troponin was less than 0.01 for all three sets of enzymes.  3. Urine drug screen was positive for cocaine and benzodiazepines.  Of note,     the urine drug screen was negative for methamphetamine.  4. Basic metabolic panel:  Sodium 142, potassium 3.9, chloride 109, bicarb     20, BUN 5, creatinine 0.9, glucose 97.  5  CBC: White blood cell count  7.1, hemoglobin 15.6, platelets 246, MCV  92.1.  1. Liver function profile all within normal limits.  2. EKG revealed normal sinus rhythm with a normal axis and nonspecific T     wave changes and no evidence of acute myocardial infarction or ischemia.   HOSPITAL COURSE:  #1.  CHEST PAIN:  As stated above, the patient was brought in to rule out  cardiac origin of chest pain.  Given the negative troponin q.8h. x 3 as well  as no EKG changes from admission or the following morning and improvement in  the patient's symptoms and no events on telemetry, the patient's source of  chest pain is most likely not cardiac, and he has been ruled out for acute  myocardial infarction.  The most likely source of his chest pain is some  reflux disease or a mild gastritis from alcohol use.  The patient was placed  on Protonix upon admission, and this should be continued upon transfer to  psychiatric facility and as an outpatient.   #2.  POLYSUBSTANCE ABUSE:  Given the history of seizures, the patient was  provided Ativan p.r.n.; however, he has not required any.  Up to time of  dictation, he has been clear mentation and has not been agitated or shown  any altered mental status to this date.  The patient is, however, at high  risk for withdrawal given his alcohol and drug abuse.  In addition upon  evaluation by psychiatry, the patient should receive proper treatment for  polysubstance abuse.  He states that he is willing and motivated to receive  substance abuse treatment at this time.   #3.  DEPRESSION WITH SUICIDAL IDEATION:  The patient was started on Lexapro  10 mg p.o. daily and continued on his Klonopin for anxiety.  The patient  states that he remains currently suicidal at time of dictation; however, he  does not state that there is an acute event that made him this way and has  been accumulation of multiple events in his past that has left to his suicidal ideation.  A sitter was provided with the  patient from the time of  admission up to time of discharge for suicide precautions.   #4.  TOBACCO ABUSE:  The patient was given a Nicoderm CQ patch to help  prevent nicotine withdrawal while in the hospital.  DISCHARGE DESTINATION:  The patient is to be discharged to an inpatient  psychiatric facility after evaluation by psychiatry.   SUGGESTED FOLLOWUP ITEMS:  1. Given the patient's significant history of substance abuse and history of     seizures, seizure precautions as well as withdrawal precautions and     Ativan may be required p.r.n. should the patient become agitated or begin     to exhibit signs of withdrawal.  2. The patient was empirically started on Lexapro; however, if a more     appropriate antidepressant is indicated by the inpatient psychiatric     team, there would be no objection to making that change by the patient's     primary team.   FOLLOW UP:  The patient's primary care Abbigail Anstey is Dr. Mayford Knife of [facility].  I believe he is the doctor who writes for the patient's  Klonopin.  Prior to discharge from the psychiatric facility, appropriate  followup to be arranged with the patient's primary care Quinette Hentges or any  other appropriate mental health Horace Lukas.   DIET:  The patient was tolerating regular diet at time of discharge.      Franchot Mimes, MD                         Gary Fleet, M.D.    TV/MEDQ  D:  05/01/2004  T:  05/01/2004  Job:  [id number]   cc:   Dr. Samuel Jester [facility]
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
## 2011-04-11 NOTE — Discharge Summary (Signed)
NAME:  Jason Bean NO.:  [id number]   MEDICAL RECORD NO.:  [id number]                   PATIENT TYPE:  IPS   LOCATION:  0503                                 [facility]   PHYSICIAN:  Osvaldo Shipper. Spruill, M.D.             DATE OF BIRTH:  03-Feb-1961   DATE OF ADMISSION:  06/02/2003  DATE OF DISCHARGE:  05/02/2003                                 DISCHARGE SUMMARY   DISCHARGE DIAGNOSES:  1. Benzodiazepine overdose.  2. Toxic effect of alcohol.  3. Suicidal ideation.  4. Alcohol dependence.  5. Panic disorder.   HISTORY OF PRESENT ILLNESS:  This is a 50 year old patient who was seen  initially in the emergency department of [facility] on May 01, 2003.  The patient is reported as to have ingested 15 Clonopin as well as  drinking a large amount of alcohol.  The family notified EMS and the patient  was brought to the emergency department.  The patient was observed and  treated and admitted for evaluation because of the possible side effects of  the medications that were taken.  Some mild respiratory decompensation was  noted while the patient was being observed and subsequently he was admitted.   HOSPITAL COURSE:  The patient was admitted to the medical service.  Plans  were made for psychiatric evaluation.  He attempted to sign out against  medical advice but because of his suicidal ideation this was prohibited.  The patient was seen by Dr. Jeanie Sewer with psychiatry.  The patient declined  inpatient alcohol and drug services.  It was the opinion of the psychiatric  consultant that the patient was not committable and the patient did agree to  see his outpatient psychiatrist on May 18, 2003 (associated with [facility]).   On May 02, 2003 the patient was much more sober and able to carry on a  conversation, could ambulate without problems.  He was subsequently  discharged home having received maximum benefit from his  hospitalization.   LABORATORY DATA:  The patient's arterial blood gas showed a pH of 7.32, PCO2  of 45.8, PO2 of 103 an O2 saturation of 98% on four liters of oxygen.  The  chemistries revealed the sodium to be normal at 143 and the potassium normal  at 3.8, chloride was normal at 109, CO2 normal at 26 and glucose normal at  103.  The BUN was 7, and creatinine 0.8 both of which were normal.  The  liver function studies were all within normal limits.  Acetaminophen was  less than 10 and salicylates was less than four.  The drug screen revealed  positive benzodiazepines.  The alcohol level was 191 with the normal limits  being 0 to 10.  The patient was also positive for tricyclics.   PLAN:  The patient is to be seen by [facility] and the  psychiatrist as an outpatient.  He is to notify the physician immediately  with any changes,  problems or concerns.  He has family support at this  point to assist him.     Ivery Quale, P.A.                       Osvaldo Shipper. Spruill, M.D.   HB/MEDQ  D:  06/07/2003  T:  06/07/2003  Job:  [id number]
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
## 2011-09-25 ENCOUNTER — Ambulatory Visit: Payer: Self-pay | Admitting: Internal Medicine

## 2011-10-02 ENCOUNTER — Ambulatory Visit: Payer: Self-pay | Admitting: Internal Medicine

## 2011-10-02 DIAGNOSIS — Z0289 Encounter for other administrative examinations: Secondary | ICD-10-CM

## 2012-01-06 ENCOUNTER — Ambulatory Visit (INDEPENDENT_AMBULATORY_CARE_PROVIDER_SITE_OTHER): Payer: PRIVATE HEALTH INSURANCE | Admitting: Family Medicine

## 2012-01-06 ENCOUNTER — Ambulatory Visit: Payer: PRIVATE HEALTH INSURANCE

## 2012-01-06 VITALS — BP 128/82 | HR 80 | Temp 98.1°F | Resp 16 | Ht 68.5 in | Wt 188.6 lb

## 2012-01-06 DIAGNOSIS — R059 Cough, unspecified: Secondary | ICD-10-CM

## 2012-01-06 DIAGNOSIS — R05 Cough: Secondary | ICD-10-CM

## 2012-01-06 DIAGNOSIS — I10 Essential (primary) hypertension: Secondary | ICD-10-CM | POA: Insufficient documentation

## 2012-01-06 DIAGNOSIS — M542 Cervicalgia: Secondary | ICD-10-CM

## 2012-01-06 DIAGNOSIS — J449 Chronic obstructive pulmonary disease, unspecified: Secondary | ICD-10-CM

## 2012-01-06 DIAGNOSIS — G8929 Other chronic pain: Secondary | ICD-10-CM

## 2012-01-06 DIAGNOSIS — E785 Hyperlipidemia, unspecified: Secondary | ICD-10-CM | POA: Insufficient documentation

## 2012-01-06 DIAGNOSIS — F419 Anxiety disorder, unspecified: Secondary | ICD-10-CM | POA: Insufficient documentation

## 2012-01-06 DIAGNOSIS — J209 Acute bronchitis, unspecified: Secondary | ICD-10-CM

## 2012-01-06 MED ORDER — AMOXICILLIN-POT CLAVULANATE 875-125 MG PO TABS: 1.0000 | ORAL_TABLET | Freq: Two times a day (BID) | ORAL | Status: AC

## 2012-01-06 MED ORDER — CITALOPRAM HYDROBROMIDE 20 MG PO TABS: 20.0000 mg | ORAL_TABLET | Freq: Every day | ORAL | Status: DC

## 2012-01-06 MED ORDER — OXYCODONE-ACETAMINOPHEN 5-325 MG PO TABS: 1.0000 | ORAL_TABLET | Freq: Four times a day (QID) | ORAL | Status: AC | PRN

## 2012-01-06 MED ORDER — AMOXICILLIN-POT CLAVULANATE 875-125 MG PO TABS: 1.0000 | ORAL_TABLET | Freq: Two times a day (BID) | ORAL | Status: DC

## 2012-01-06 NOTE — Progress Notes (Signed)
51 yo man with chronic neck pain for years dx'd as bulging disc in neck with cervical radiculitis on the right and low back pain.  He has been at chronic pain clinic in [facility].  Currently on oxycodone from Dr. Orrin Brigham.  Has recently undergone shots to neck.  Past EMG 09/19/2010 was normal.  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  In and out of pain clinics since.   Now has persistent cough with productive sputum brown colored.  No dyspea.  No chest pain.  No fevers.  O:  Appears slightly drugged.  NAD.  Cooperative HEENT:  Unremarkable Neck:  Decrease ROM, no JVD or bruit, no adenopathy or thyromegaly Skin:  Warm and dry Heart:  Reg without murmur or gallop Chest:  Bilateral ronchi  [facility] reading (PRIMARY) by  Dr. Milus Glazier - CXR chronic changes with hazy RLL.  Assessment:  COPD with acute bronchitis vs pneumonia Chronic pain syndrome in neck Anxiety.  P:  Augmentin 875 bid x 10 days Pain management referral Oxycodone 5 mg/325 qid x 1 month

## 2012-01-06 NOTE — Patient Instructions (Signed)
Return in one month.  

## 2012-01-18 ENCOUNTER — Telehealth: Payer: Self-pay

## 2012-01-18 NOTE — Telephone Encounter (Signed)
.  [facility] Pt called regarding his referral to [facility] Neurologist.  Pt states he is willing to come in for office visit if needed to discuss referral. Please call patient to review referral status at [phone].

## 2012-01-19 ENCOUNTER — Other Ambulatory Visit: Payer: Self-pay | Admitting: Family Medicine

## 2012-01-19 DIAGNOSIS — M5090 Cervical disc disorder, unspecified, unspecified cervical region: Secondary | ICD-10-CM

## 2012-01-20 ENCOUNTER — Ambulatory Visit (INDEPENDENT_AMBULATORY_CARE_PROVIDER_SITE_OTHER): Payer: PRIVATE HEALTH INSURANCE | Admitting: Family Medicine

## 2012-01-20 VITALS — BP 121/81 | HR 91 | Temp 98.9°F | Resp 18 | Ht 69.0 in | Wt 185.0 lb

## 2012-01-20 DIAGNOSIS — M549 Dorsalgia, unspecified: Secondary | ICD-10-CM

## 2012-01-20 DIAGNOSIS — M5090 Cervical disc disorder, unspecified, unspecified cervical region: Secondary | ICD-10-CM

## 2012-01-20 MED ORDER — GABAPENTIN 300 MG PO CAPS: 300.0000 mg | ORAL_CAPSULE | Freq: Three times a day (TID) | ORAL | Status: DC

## 2012-01-20 MED ORDER — OXYCODONE-ACETAMINOPHEN 10-325 MG PO TABS: 1.0000 | ORAL_TABLET | Freq: Three times a day (TID) | ORAL | Status: AC | PRN

## 2012-01-20 NOTE — Progress Notes (Signed)
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  He saw brain and spine surgeon in [place] who set up appointment in pain management over in [place].  Patient doesn't want to take pain pills the rest of his life.  He prefers some surgical correction.  The initial pain syndrome came after moving mulch in girlfriend's back yard. He had 2 lumbar disc surgeries in [facility] 2007, but pain persists and right sciatic symptoms persist. Has had 6 steroid shots in his neck so far which haven't helped.  O:  Patient is alert although speech is a bit slurred. Weak grasps, FROM both arms, DTR's diminished.  A:  Chronic pain syndrome  P:  Neurosurgical second opinion. Continued pain meds.

## 2012-02-03 ENCOUNTER — Ambulatory Visit (INDEPENDENT_AMBULATORY_CARE_PROVIDER_SITE_OTHER): Payer: PRIVATE HEALTH INSURANCE | Admitting: Family Medicine

## 2012-02-03 DIAGNOSIS — G8921 Chronic pain due to trauma: Secondary | ICD-10-CM

## 2012-02-03 DIAGNOSIS — G44209 Tension-type headache, unspecified, not intractable: Secondary | ICD-10-CM

## 2012-02-03 DIAGNOSIS — E119 Type 2 diabetes mellitus without complications: Secondary | ICD-10-CM

## 2012-02-03 DIAGNOSIS — J449 Chronic obstructive pulmonary disease, unspecified: Secondary | ICD-10-CM

## 2012-02-03 DIAGNOSIS — G8929 Other chronic pain: Secondary | ICD-10-CM

## 2012-02-03 LAB — GLUCOSE, POCT (MANUAL RESULT ENTRY): POC Glucose: 100

## 2012-02-03 MED ORDER — OXYCODONE-ACETAMINOPHEN 7.5-325 MG PO TABS: 1.0000 | ORAL_TABLET | ORAL | Status: AC | PRN

## 2012-02-03 MED ORDER — CLONAZEPAM 1 MG PO TABS: 1.0000 mg | ORAL_TABLET | Freq: Three times a day (TID) | ORAL | Status: DC | PRN

## 2012-02-03 NOTE — Progress Notes (Signed)
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  His CT and MRI were neg, but he was found to have diabetes.  He was kept a couple days and given insulin, then discharged one week ago.  He was only given a week or so of meds on discharge and was told to follow up with PCP.  He has no appetite at all  His neck continues to hurt despite Percocet.  He wants to stop xanax and switch to clonazepam.  Just using rescue inhaler.  Still smoking.  F/H COPD  O:  Alert and anxious.  Confused about what is going on. Chest:  Few exp wheezes Heart:  Reg, no murmur Abdomen:  Soft, nontender without HSM HEENT unremarkable Skin warm and dry Results for orders placed in visit on 02/03/12  GLUCOSE, POCT (MANUAL RESULT ENTRY)      Component Value Range   POC Glucose 100      A: glucose wellcontrolled, pain - not, I am not sure why this patient had recent vomiting and headaches. Apparently his MRI revealed no abnormalities and he now has a normal blood sugar. Thankfully, his symptoms have resolved other than the chronic neck pain. L1 see him back in a month. P: Patient has been referred to [facility] pain clinic Continue pain meds, will increase to 7.5 oxycodone  I've asked him to take just one metformin 500 mg daily until the next visit

## 2012-02-03 NOTE — Patient Instructions (Signed)
Recheck one month

## 2012-02-04 ENCOUNTER — Telehealth: Payer: Self-pay

## 2012-02-04 NOTE — Telephone Encounter (Signed)
Pt states that [facility] is going to see him 02/23/12 but he needs an mri that includes his neck all the way down his back  Please call patient

## 2012-02-06 ENCOUNTER — Other Ambulatory Visit: Payer: Self-pay | Admitting: Family Medicine

## 2012-02-06 DIAGNOSIS — M542 Cervicalgia: Secondary | ICD-10-CM

## 2012-02-12 ENCOUNTER — Ambulatory Visit: Payer: PRIVATE HEALTH INSURANCE | Admitting: Family Medicine

## 2012-02-12 DIAGNOSIS — K219 Gastro-esophageal reflux disease without esophagitis: Secondary | ICD-10-CM | POA: Insufficient documentation

## 2012-02-18 NOTE — Telephone Encounter (Signed)
PT CALLED TO CHECK ON STATUS OF MRI REFERRAL AND ALSO NEEDS REFILL OF OXYCODONE

## 2012-02-24 ENCOUNTER — Ambulatory Visit (INDEPENDENT_AMBULATORY_CARE_PROVIDER_SITE_OTHER): Payer: PRIVATE HEALTH INSURANCE | Admitting: Family Medicine

## 2012-02-24 DIAGNOSIS — E119 Type 2 diabetes mellitus without complications: Secondary | ICD-10-CM

## 2012-02-24 DIAGNOSIS — R51 Headache: Secondary | ICD-10-CM

## 2012-02-24 DIAGNOSIS — G4700 Insomnia, unspecified: Secondary | ICD-10-CM

## 2012-02-24 DIAGNOSIS — R05 Cough: Secondary | ICD-10-CM

## 2012-02-24 DIAGNOSIS — R059 Cough, unspecified: Secondary | ICD-10-CM

## 2012-02-24 LAB — GLUCOSE, POCT (MANUAL RESULT ENTRY): POC Glucose: 128

## 2012-02-24 MED ORDER — MOMETASONE FURO-FORMOTEROL FUM 200-5 MCG/ACT IN AERO: 2.0000 | INHALATION_SPRAY | Freq: Two times a day (BID) | RESPIRATORY_TRACT | Status: DC

## 2012-02-24 MED ORDER — OXYCODONE-ACETAMINOPHEN 10-325 MG PO TABS: 1.0000 | ORAL_TABLET | Freq: Three times a day (TID) | ORAL | Status: AC | PRN

## 2012-02-24 NOTE — Progress Notes (Signed)
S:  Headaches x 4 months despite the oxycodone 7.5 tid.  Has MRI scheduled for tomorrow. Also c/o persistent cough.  Unable to figure out how to use Advair.  Still smoking  O:  Alert Decreased range of motion in neck.  Tender left posterior base of c-spine Fundi:  Normal Chest:  Clear Oroph:  Geographic tongue Heart: S4 with early systolic ejection murmur Abdomen:  Smooth liver edge. Results for orders placed in visit on 02/24/12  GLUCOSE, POCT (MANUAL RESULT ENTRY)      Component Value Range   POC Glucose 128      A:  Poor pain control, insomnia, cough P: increase the oxycodone to 10 tid, try melatonin and dulera for the cough.

## 2012-02-25 ENCOUNTER — Other Ambulatory Visit: Payer: Self-pay | Admitting: Family Medicine

## 2012-02-25 ENCOUNTER — Ambulatory Visit
Admission: RE | Admit: 2012-02-25 | Discharge: 2012-02-25 | Disposition: A | Discharge status: Home / Self Care | Payer: Medicaid Other | Source: Ambulatory Visit | Attending: Family Medicine | Admitting: Family Medicine

## 2012-02-25 DIAGNOSIS — M5090 Cervical disc disorder, unspecified, unspecified cervical region: Secondary | ICD-10-CM

## 2012-02-25 DIAGNOSIS — M542 Cervicalgia: Secondary | ICD-10-CM

## 2012-02-25 NOTE — Telephone Encounter (Signed)
Pharmacist called to alert [place] that he is also on Ultram from another provider and they are concerned about our RX for Percocet.  Spoke to Dr. Milus Glazier - he can have either the Percocet or the Ultram.  He has enough disease process to require Percocet and has a referral to neurosurgery started.  Spoke with the pharmacist and told they that the patient can get either the Percocet or the Ultram and the other should be cancelled.  I have tried to call the patient on his # [phone] multiple times without an answer and no voice mail to leave a message.  If the patient calls back please give him this information.

## 2012-02-26 ENCOUNTER — Telehealth: Payer: Self-pay

## 2012-02-26 NOTE — Telephone Encounter (Signed)
Patient   [facility] [address] in [place] is having issues filling pain medication   Patient also wants to know who is going to be his surgeon   PLEASE ADVISE

## 2012-02-26 NOTE — Telephone Encounter (Signed)
Tried to call pt. No answer, no VM

## 2012-02-27 NOTE — Telephone Encounter (Signed)
Still no answer, no VM. Sent unable to reach letter with the info from Sarah.

## 2012-02-28 NOTE — Telephone Encounter (Signed)
Called patient to discuss, no answer or VM.

## 2012-03-01 NOTE — Telephone Encounter (Signed)
Tried to CB pt. No answer, no VM. Called [facility] to see if there is a problem w/his percocet Rx. Pharmacy reported that orig, they did not have enough for entire # and gave pt some to use until rest could be ordered. Pt has since p/up the rest. Lupita Leash, I am forwarding this to you to check on pt's referral. It looks like [place] neuro was waiting on add'l note to be sent. Have you sent what is needed, or do we still need to send them anything? Thank you for checking

## 2012-03-09 ENCOUNTER — Emergency Department (HOSPITAL_COMMUNITY)
Admission: EM | Admit: 2012-03-09 | Discharge: 2012-03-09 | Disposition: A | Discharge status: Home / Self Care | Payer: PRIVATE HEALTH INSURANCE | Attending: Emergency Medicine | Admitting: Emergency Medicine

## 2012-03-09 ENCOUNTER — Encounter (HOSPITAL_COMMUNITY): Payer: Self-pay | Admitting: *Deleted

## 2012-03-09 ENCOUNTER — Emergency Department (HOSPITAL_COMMUNITY): Payer: PRIVATE HEALTH INSURANCE

## 2012-03-09 DIAGNOSIS — F10929 Alcohol use, unspecified with intoxication, unspecified: Secondary | ICD-10-CM

## 2012-03-09 DIAGNOSIS — G8929 Other chronic pain: Secondary | ICD-10-CM | POA: Insufficient documentation

## 2012-03-09 DIAGNOSIS — Z79899 Other long term (current) drug therapy: Secondary | ICD-10-CM | POA: Insufficient documentation

## 2012-03-09 DIAGNOSIS — J4489 Other specified chronic obstructive pulmonary disease: Secondary | ICD-10-CM | POA: Insufficient documentation

## 2012-03-09 DIAGNOSIS — E785 Hyperlipidemia, unspecified: Secondary | ICD-10-CM | POA: Insufficient documentation

## 2012-03-09 DIAGNOSIS — F1010 Alcohol abuse, uncomplicated: Secondary | ICD-10-CM | POA: Insufficient documentation

## 2012-03-09 DIAGNOSIS — J449 Chronic obstructive pulmonary disease, unspecified: Secondary | ICD-10-CM | POA: Insufficient documentation

## 2012-03-09 DIAGNOSIS — Z8739 Personal history of other diseases of the musculoskeletal system and connective tissue: Secondary | ICD-10-CM | POA: Insufficient documentation

## 2012-03-09 DIAGNOSIS — M542 Cervicalgia: Secondary | ICD-10-CM | POA: Insufficient documentation

## 2012-03-09 DIAGNOSIS — I10 Essential (primary) hypertension: Secondary | ICD-10-CM | POA: Insufficient documentation

## 2012-03-09 LAB — BASIC METABOLIC PANEL
BUN: 7 mg/dL (ref 6–23)
Calcium: 9.1 mg/dL (ref 8.4–10.5)
GFR calc Af Amer: >90 mL/min (ref >90)
GFR calc non Af Amer: >90 mL/min (ref >90)
Glucose, Bld: 103 mg/dL — ABNORMAL HIGH (ref 70–99)
Sodium: 139 mEq/L (ref 135–145)

## 2012-03-09 LAB — CBC
Hemoglobin: 15.6 g/dL (ref 13.0–17.0)
MCH: 32.4 pg (ref 26.0–34.0)
MCHC: 33.8 g/dL (ref 30.0–36.0)
RDW: 16.4 % — ABNORMAL HIGH (ref 11.5–15.5)

## 2012-03-09 MED ORDER — SODIUM CHLORIDE 0.9 % IV BOLUS (SEPSIS): 1000.0000 mL | Freq: Once | INTRAVENOUS | Status: DC

## 2012-03-09 NOTE — ED Provider Notes (Signed)
History     CSN: [id number]  Arrival date & time 03/09/12  0531   First MD Initiated Contact with Patient 03/09/12 [phone]      Chief Complaint  Patient presents with  . Neck Injury    (Consider location/radiation/quality/duration/timing/severity/associated sxs/prior treatment) Patient is a 51 y.o. male presenting with neck injury. The history is provided by medical records, the patient and the EMS personnel. History Limited By: pt intoxication.  Neck Injury This is a chronic problem.  Pt with chronic neck and back pain with multiple prior outpatient evals for same. Recently discharged from [facility] after a 7 day stay. Records show most recent injury in 2011. Pt had recent MRI of the cervical spine and has been advised to follow-up with neurosurgery as well as pain management. Pt reports calling his doctor overnight with c/o neck pain and was advised to present to the ED. Denies any new injury. Denies new weakness or numbness. Denies fever, chills, visual change. Pain located in posterior neck. Worse with movement. Better with narcotic pain medication.  Pt also endorses alcohol consumption overnight, but will not elaborate on ingested amount. Denies any recreational drug use, though nursing note mentions admitted heroin use.   Past Medical History  Diagnosis Date  . COPD (chronic obstructive pulmonary disease)   . Hypertension   . Hyperlipidemia   . Arthritis     History reviewed. No pertinent past surgical history.  History reviewed. No pertinent family history.  History  Substance Use Topics  . Smoking status: Current Everyday Smoker -- 1.5 packs/day for 30 years    Types: Cigarettes  . Smokeless tobacco: Not on file  . Alcohol Use: No     Pt drinks daily . unknown amount      Review of Systems Full ROS is difficult due to pt intoxication. All systems are reviewed and pt notes no acute change except as noted in the HPI.  Allergies  Review of patient's allergies  indicates no known allergies.  Home Medications   Current Outpatient Rx  Name Route Sig Dispense Refill  . ALBUTEROL SULFATE HFA 108 (90 BASE) MCG/ACT IN AERS Inhalation Inhale 2 puffs into the lungs every 6 (six) hours as needed.    . ALPRAZOLAM 1 MG PO TABS Oral Take 1 mg by mouth 2 (two) times daily.    Marland Kitchen CLONAZEPAM 1 MG PO TABS Oral Take 1 mg by mouth 3 (three) times daily as needed.    Marland Kitchen GABAPENTIN 300 MG PO CAPS Oral Take 1 capsule (300 mg total) by mouth 3 (three) times daily. 90 capsule 3  . METFORMIN HCL 500 MG PO TABS Oral Take 500 mg by mouth 2 (two) times daily with a meal.    . MOMETASONE FURO-FORMOTEROL FUM 200-5 MCG/ACT IN AERO Inhalation Inhale 2 puffs into the lungs 2 (two) times daily. 1 Inhaler 3  . OMEPRAZOLE 20 MG PO CPDR Oral Take 20 mg by mouth daily.    . TRAZODONE HCL 100 MG PO TABS Oral Take 100 mg by mouth at bedtime.    Marland Kitchen CITALOPRAM HYDROBROMIDE 20 MG PO TABS Oral Take 10 mg by mouth daily.    Marland Kitchen CLONAZEPAM 1 MG PO TABS Oral Take 1 tablet (1 mg total) by mouth 3 (three) times daily as needed for anxiety. 90 tablet 4    BP 78/56  Pulse 91  Temp(Src) 97.7 F (36.5 C) (Oral)  Resp 15  SpO2 94%  Physical Exam  Constitutional: He appears well-developed and well-nourished.  No distress.       VS reviewed, sig for hypotension and slight hypoxia on room air.   HENT:  Head: Normocephalic and atraumatic.  Right Ear: External ear normal.  Left Ear: External ear normal.  Mouth/Throat: Oropharynx is clear and moist.  Eyes: Pupils are equal, round, and reactive to light.  Neck: No tracheal deviation present.       ROM to neck is limited in all directions, pt with poor effort. Pain not worsened with hip flexion to 90 degrees.  Cardiovascular: Normal rate, regular rhythm, normal heart sounds and intact distal pulses.   Pulmonary/Chest: Effort normal and breath sounds normal. No stridor. No respiratory distress. He has no wheezes. He has no rales. He exhibits no  tenderness.  Abdominal: Soft. Bowel sounds are normal. He exhibits no distension. There is no tenderness. There is no guarding.  Musculoskeletal: He exhibits no edema.       See neck examination. TTP posterior neck, remainder of spine/paraspinal regions non-tender. Full ROM to all extremities. 4/5 strength to bilateral upper and lower extremities, suspect due to poor effort secondary to intoxication.  Lymphadenopathy:    He has no cervical adenopathy.  Neurological:       Pt sleepy, difficult to rouse during examination. Oriented to person, place, and time. Does not cooperate with cranial nerve testing or gait testing.   Skin: Skin is warm and dry. No rash noted.    ED Course  Procedures (including critical care time)  Labs Reviewed  ETHANOL - Abnormal; Notable for the following:    Alcohol, Ethyl (B) 212 (*)    All other components within normal limits  CBC - Abnormal; Notable for the following:    RDW 16.4 (*)    All other components within normal limits  BASIC METABOLIC PANEL - Abnormal; Notable for the following:    Glucose, Bld 103 (*)    All other components within normal limits  URINE RAPID DRUG SCREEN (HOSP PERFORMED)  ACETAMINOPHEN LEVEL   No results found.   1. Chronic neck pain   2. Alcohol intoxication       MDM  7:15 AM Pt seen and evaluated. Initial history and physical examination complete. Alcohol level reviewed. Pt appears intoxicated. Given difficulty in rousing patient, tylenol level will be checked to eval for percocet overuse/OD. Although negative respiratory ROS, will get CXR given O2 sat of 90% on RA. I do not feel comfortable administering narcotics to this patient given his mental status. Doubt intracranial pathology as pt oriented, equal pupils, equal strength bilaterally, and chronic nature of symptoms that pt reports are unchanged.      7:40 AM Pt became argumentative when notified that he would not receive narcotics. He is now much more alert,  standing in doorway, speech clear. Has ambulated in ED with steady gait. He has requested to leave as he is not receiving narcotic pain medication. Blood pressure is improved after fluid administration. Oxygen saturation now 100% on room air, suspect initial low reading secondary to poor effort due to intoxication. I feel he is safe to be discharged at this time. Return precautions were discussed with the patient and he was advised to follow-up with his regular doctors as needed for chronic symptoms.           Shaaron Adler, [place] 03/09/12 1109

## 2012-03-09 NOTE — ED Notes (Signed)
Pt sts he is ready to leave. Getting out of bed, able to ambulate steadily on his own. Pt stating he needs cab voucher to get home to [place]. Informed these are not available, only bus passes. Pt became angry and began cussing at staff stating he wanted to talk to "head supervisor." Diane, Charge RN made aware.

## 2012-03-09 NOTE — ED Notes (Signed)
MVH:QI69<GE> Expected date:<BR> Expected time:<BR> Means of arrival:<BR> Comments:<BR> EMS/back and neck pain for 6 months-narcotic and ETOH use

## 2012-03-09 NOTE — ED Notes (Signed)
Debby, Dept director to come speak with pt.

## 2012-03-09 NOTE — ED Notes (Signed)
Pt from home with c/o neck x 6 months, Pt called his Md and was advised to come to the ER. Pt admits to drinking 3-4 beers tonight and using heroin tonight as well. Pt stable on arrival. Pt alert but speech is slurred

## 2012-03-09 NOTE — ED Provider Notes (Signed)
Medical screening examination/treatment/procedure(s) were performed by non-physician practitioner and as supervising physician I was immediately available for consultation/collaboration.  Melessa Cowell, MD 03/09/12 1457 

## 2012-03-09 NOTE — Discharge Instructions (Signed)
Please follow-up with your primary doctor, neurosurgeon, or pain management doctor for further narcotic pain medications.       Chronic Pain Chronic pain can be defined as pain that is lasting, off and on, and lasts for 3 to 6 months or longer. Many things cause chronic pain, which can make it difficult to make a discrete diagnosis. There are many treatment options available for chronic pain. However, finding a treatment that works well for you may require trying various approaches until a suitable one is found. CAUSES  In some types of chronic medical conditions, the pain is caused by a normal pain response within the body. A normal pain response helps the body identify illness or injury and prevent further damage from being done. In these cases, the cause of the pain may be identified and treated, even if it may not be cured completely. Examples of chronic conditions which can cause chronic pain include:  Inflammation of the joints (arthritis).   Back pain or neck pain (including bulging or herniated disks).   Migraine headaches.   Cancer.  In some other types of chronic pain syndromes, the pain is caused by an abnormal pain response within the body. An abnormal pain response is present when there is no ongoing cause (or stimulus) for the pain, or when the cause of the pain is arising from the nerves or nervous system itself. Examples of conditions which can cause chronic pain due to an abnormal pain response include:  Fibromyalgia.   Reflex sympathetic dystrophy (RSD).   Neuropathy (when the nerves themselves are damaged, and may cause pain).  DIAGNOSIS  Your caregiver will help diagnose your condition over time. In many cases, the initial focus will be on excluding conditions that could be causing the pain. Depending on your symptoms, your caregiver may order some tests to diagnose your condition. Some of these tests include:  Blood tests.   Computerized X-ray scans (CT scan).    Computerized magnetic scans (MRI).   X-rays.   Ultrasounds.   Nerve conduction studies.   Consultation with other physicians or specialists.  TREATMENT  There are many treatment options for people suffering from chronic pain. Finding a treatment that works well may take time.   You may be referred to a pain management specialist.   You may be put on medication to help with the pain. Unfortunately, some medications (such as opiate medications) may not be very effective in cases where chronic pain is due to abnormal pain responses. Finding the right medications can take some time.   Adjunctive therapies may be used to provide additional relief and improve a patient's quality of life. These therapies include:   Mindfulness meditation.   Acupuncture.   Biofeedback.   Cognitive-behavioral therapy.   In certain cases, surgical interventions may be attempted.  HOME CARE INSTRUCTIONS   Make sure you understand these instructions prior to discharge.   Ask any questions and share any further concerns you have with your caregiver prior to discharge.   Take all medications as directed by your caregiver.   Keep all follow-up appointments.  SEEK MEDICAL CARE IF:   Your pain gets worse.   You develop a new pain that was not present before.   You cannot tolerate any medications prescribed by your caregiver.   You develop new symptoms since your last visit with your caregiver.  SEEK IMMEDIATE MEDICAL CARE IF:   You develop muscular weakness.   You have decreased sensation or numbness.  You lose control of bowel or bladder function.   Your pain suddenly gets much worse.   You have an oral temperature above 102 F (38.9 C), not controlled by medication.   You develop shaking chills, confusion, chest pain, or shortness of breath.  Document Released: 08/02/2002 Document Revised: 10/30/2011 Document Reviewed: 11/08/2008 [facility] Patient Information 2012 [place],  [place].         Alcohol Intoxication You have alcohol intoxication when the amount of alcohol that you have consumed has impaired your ability to mentally and physically function. There are a variety of factors that contribute to the level at which alcohol intoxication can occur, such as age, gender, weight, frequency of alcohol consumption, medication use, and the presence of other medical conditions, such as diabetes, seizures, or heart conditions. The blood alcohol level test measures the concentration of alcohol in your blood. In most states, your blood alcohol level must be lower than 80 mg/dL (1.61%) to legally drive. However, many dangerous effects of alcohol can occur at much lower levels. Alcohol directly impairs the normal chemical activity of the brain and is said to be a chemical depressant. Alcohol can cause drowsiness, stupor, respiratory failure, and coma. Other physical effects can include headache, vomiting, vomiting of blood, abdominal pain, a fast heartbeat, difficulty breathing, anxiety, and amnesia. Alcohol intoxication can also lead to dangerous and life-threatening activities, such as fighting, dangerous operation of vehicles or heavy machinery, and risky sexual behavior. Alcohol can be especially dangerous when taken with other drugs. Some of these drugs are:  Sedatives.   Painkillers.   Marijuana.   Tranquilizers.   Antihistamines.   Muscle relaxants.   Seizure medicine.  Many of the effects of acute alcohol intoxication are temporary. However, repeated alcohol intoxication can lead to severe medical illnesses. If you have alcohol intoxication, you should:  Stay hydrated. Drink enough water and fluids to keep your urine clear or pale yellow. Avoid excessive caffeine because this can further lead to dehydration.   Eat a healthy diet. You may have residual nausea, headache, and loss of appetite, but it is still important that you maintain good nutrition. You can  start with clear liquids.   Take nonsteroidal anti-inflammatory medications as needed for headaches, but make sure to do so with small meals. You should avoid acetaminophen for several days after having alcohol intoxication because the combination of alcohol and acetaminophen can be toxic to your liver.  If you have frequent alcohol intoxication, ask your friends and family if they think you have a drinking problem. For further help, contact:  Your caregiver.   Alcoholics Anonymous (AA).   A drug or alcohol rehabilitation program.  SEEK MEDICAL CARE IF:   You have persistent vomiting.   You have persistent pain in any part of your body.   You do not feel better after a few days.  SEEK IMMEDIATE MEDICAL CARE IF:   You become shaky or tremble when you try to stop drinking.   You shake uncontrollably (seizure).   You throw up (vomit) blood. This may be bright red or it may look like black coffee grounds.   You have blood in the stool. This may be bright red or appear as a black, tarry, bad smelling stool.   You become lightheaded or faint.  ANY OF THESE SYMPTOMS MAY REPRESENT A SERIOUS PROBLEM THAT IS AN EMERGENCY. Do not wait to see if the symptoms will go away. Get medical help right away. Call your local emergency services (911  in [place] DO NOT drive yourself to the hospital. MAKE SURE YOU:   Understand these instructions.   Will watch your condition.   Will get help right away if you are not doing well or get worse.  Document Released: 08/20/2005 Document Revised: 10/30/2011 Document Reviewed: 04/29/2010 [facility] Patient Information 2012 [place], [place].

## 2012-03-09 NOTE — ED Notes (Signed)
Awaiting return call from SW for transportation arrangements.

## 2012-03-09 NOTE — ED Notes (Signed)
Pt. Escorted by security to waiting area to wait for [employer] [facility].

## 2012-03-10 NOTE — Telephone Encounter (Signed)
Pt calling he states that the medication he is currently taking is not strong enough and he cant get into a pain management clinic till next week, so he is requesting to see if Dr will call in a pain medication that is stronger.

## 2012-03-11 NOTE — Telephone Encounter (Signed)
Dr. Elbert Ewings can you please advise on this patient

## 2012-03-11 NOTE — Telephone Encounter (Signed)
PT CALLED TONIGHT WANTING TO KNOW ABOUT HIS MEDICATION

## 2012-03-11 NOTE — Telephone Encounter (Signed)
I have repeatedly told patient that I have no stronger medicines for which I am licensed to prescribe.  He needs to be patient and await his pain clinic appointment, or go to the emergency room

## 2012-03-12 NOTE — Telephone Encounter (Signed)
Gave pt info from Dr L. Pt agreed and stated that he has appt w/surgeon on Tues.

## 2012-03-21 ENCOUNTER — Telehealth: Payer: Self-pay

## 2012-03-21 NOTE — Telephone Encounter (Signed)
Pt called wanting call back today about meds. Pt sees Dr Milus Glazier

## 2012-03-21 NOTE — Telephone Encounter (Signed)
Looks like patient should have enough rx through 5/2, correct?  Previous phone messages state patient should go to ER if not pain management.  To PAs to advise.

## 2012-03-21 NOTE — Telephone Encounter (Signed)
Patient was given enough to last until 5/2. Sorry cannot refill early. Advise if in that much pain to go to the ER.

## 2012-03-21 NOTE — Telephone Encounter (Signed)
PT OF DR. L.  SAYS HE HAS GOT TO HAVE SOME MORE PAIN MEDICINE CALLED IN PRIOR TO HIS SURGERY.  IF NOT HE IS GOING TO HAVE TO GO TO THE EMERGENCY ROOM DUE TO HIS PAIN  PLEASE CALL

## 2012-03-22 NOTE — Telephone Encounter (Signed)
LMOM to call back

## 2012-03-22 NOTE — Telephone Encounter (Signed)
Spoke with patient and he stated that he is going to pain clinic tomorrow.

## 2012-03-27 ENCOUNTER — Telehealth: Payer: Self-pay

## 2012-03-27 NOTE — Telephone Encounter (Signed)
PT STATES IT IS VERY IMPORTANT THAT HE TALK WITH DR LAUENSTEIN-HE IS HAVING SIDE PAIN AGAIN AND WOULD LIKE TO TALK WITH DR L STATES THAT HE IS NOT SURE HOW MUCH MORE OF THIS PAIN HE CAN TAKE   BEST NUMBER [phone]

## 2012-03-28 NOTE — Telephone Encounter (Signed)
LMOM to call back

## 2012-03-28 NOTE — Telephone Encounter (Signed)
Dr. Milus Glazier is not in the office today.  Please get details.  Can someone else be of assistance? He has previously been advised to go to the ED if his pain is worsening, and he has been referred to pain management (per previous phone notes, he had an appointment 4/30).

## 2012-03-29 NOTE — Telephone Encounter (Signed)
LMOM to CB. 

## 2012-03-30 NOTE — Telephone Encounter (Signed)
LMOM to CB if he still needs to speak w/someone. Sent unable to reach letter

## 2012-04-04 ENCOUNTER — Telehealth: Payer: Self-pay

## 2012-04-04 NOTE — Telephone Encounter (Signed)
PT STATES MEDS IS NOT WORKING CALL [phone]

## 2012-04-05 ENCOUNTER — Other Ambulatory Visit: Payer: Self-pay | Admitting: Family Medicine

## 2012-04-05 DIAGNOSIS — F419 Anxiety disorder, unspecified: Secondary | ICD-10-CM

## 2012-04-05 MED ORDER — ALPRAZOLAM 1 MG PO TABS: 1.0000 mg | ORAL_TABLET | Freq: Two times a day (BID) | ORAL | Status: DC

## 2012-04-05 NOTE — Telephone Encounter (Signed)
Pt verified that he uses the [facility] as listed in [place]. He was taking xanax 1 mg TID just as he was taking the klonopin, but the xanax works better for him.

## 2012-04-05 NOTE — Telephone Encounter (Signed)
Faxed Rx to pharm

## 2012-04-05 NOTE — Telephone Encounter (Signed)
Spoke with patient and he states his medication was changed from alprazolam to clonazepam and it is not working. He would like to change back to alprazolam. Please advise.

## 2012-04-05 NOTE — Telephone Encounter (Signed)
The deed is done.

## 2012-04-05 NOTE — Telephone Encounter (Signed)
Returned patient call on number listed and it kept ringing.

## 2012-04-05 NOTE — Telephone Encounter (Signed)
Ok.  Just tell me the pharmacy.

## 2012-04-08 NOTE — Telephone Encounter (Signed)
Pt states he would like [place] to call the pharmacy so he can have his xanax filled for 90 instead of 60 because he takes 3 a day instead of 2 a day. Pharamcy:[facility]

## 2012-04-09 ENCOUNTER — Other Ambulatory Visit: Payer: Self-pay

## 2012-04-09 NOTE — Telephone Encounter (Signed)
PATIENT STATES HE WOULD LIKE DR. L TO PUT HIM BACK ON KALONAPIN BECAUSE THE WAY HIS MEDICINE HAS BEEN CHANGED IS NOT WORKING FOR HIM.

## 2012-04-09 NOTE — Telephone Encounter (Signed)
Pt called again, is concerned that Dr. Elbert Ewings only gave him enough alprazolam to take 2 a day and he has been taking 3 a day for years.Marland KitchenMarland Kitchen

## 2012-04-09 NOTE — Telephone Encounter (Signed)
Patient should be on alprazolam bid

## 2012-04-09 NOTE — Telephone Encounter (Signed)
Dr. Milus Glazier-  Please clarify whether this patient's Alprazolam should be BID or TID.  I see he was previously on Clonazepam TID.  Route your reply back to the Clinical Message Pool.  Thanks.

## 2012-04-09 NOTE — Telephone Encounter (Signed)
PATIENT CALLED BACK TO ADD THAT WHEN HE TAKES A DEEP BREATH HE IS FEELING PAIN IN HIS CHEST, BUT HE DOES NOT NEED TO GO TO THE HOSPITAL

## 2012-04-09 NOTE — Telephone Encounter (Signed)
Patient states he was taking three Alprazolam a day and is not able to decrease to two a day.  Please advise

## 2012-04-09 NOTE — Telephone Encounter (Signed)
No answer no vm

## 2012-04-09 NOTE — Telephone Encounter (Signed)
Pt states he still hasnt got his rx correct he take alazapram TID and would like to get a 90 day supply like he has always gotten please contact pt if any questions.

## 2012-04-10 ENCOUNTER — Telehealth: Payer: Self-pay

## 2012-04-10 NOTE — Telephone Encounter (Signed)
Patient notified and will try this dose.

## 2012-04-10 NOTE — Telephone Encounter (Signed)
Try taking 1/2 tab qam and every midday, then 1 at hs.

## 2012-04-10 NOTE — Telephone Encounter (Signed)
Please call patient back. If he is having chest pain he needs to call 911 and go to the hospital. Then forward to Dr. Elbert Ewings to advise on the Xanax.

## 2012-04-10 NOTE — Telephone Encounter (Signed)
PER DR L, PT NEEDS TO GO TO ER. HE REC'D 90 PILLS OF XANAX ON 4/27. THIS IS A 30 DAY SUPPLY.

## 2012-04-10 NOTE — Telephone Encounter (Signed)
Patient is over his limit of xanax.  Needs to be evaluated at ED.

## 2012-04-10 NOTE — Telephone Encounter (Signed)
PATIENT  SAYS PHARMACY DIDN'T RECEIVE MEDICATION REQUEST FROM [place] AND THAT HE IS OUT OF MEDICINE.  I TOLD HIM IT WAS CALLED IN ON THE 13TH.  HE STILL WANTS A CALL BACK

## 2012-04-10 NOTE — Telephone Encounter (Signed)
Patient notified info below.  Patient already advised in previous message from Dr. Elbert Ewings that he should take 1/2 pill in am, 1/2 pill at noon, and 1 pill at bedtime.  Patient states he will do this.

## 2012-04-11 NOTE — Telephone Encounter (Signed)
Patient called stating he was trying to get Xanax refill at different pharmacy than normal [facility] [address] in [place]). They needed to have bottle to be able to anything, but he had already thrown it away because he was out of pills. During phone call, I didn't have pt pulled up to see previous messages, so I was unable to tell him.

## 2012-04-11 NOTE — Telephone Encounter (Signed)
Called patient, no answer 

## 2012-04-11 NOTE — Telephone Encounter (Signed)
See other messages, unable to refill.

## 2012-04-11 NOTE — Telephone Encounter (Signed)
Pt calling to check status of refill request.

## 2012-04-12 NOTE — Telephone Encounter (Signed)
Gave pt instr's from Dr Milus Glazier and advised him strongly numerous times to go to ED to test for toxic amounts of medication. Pt stated he doesn't need to do this - he is fine and hasn't had any of these meds in almost a week. Pt stated nicely that if Dr L can't Rx any more than he will go to another MD to see if he can get a Rx. Advised pt again to go to ED for test and explained how dangerous it can be to take too much of these medications.

## 2012-04-12 NOTE — Telephone Encounter (Signed)
Patient is over using his medications.  I am worried that he has toxic amounts.  Recommend he go to ED

## 2012-04-12 NOTE — Telephone Encounter (Signed)
Spoke w/pt to clarify w/ pt that he is trying xanax 1/2 am, 1/2 noon, 1 hs, and also that he is no longer experiencing chest pain. Pt stated he was having a panic attack which is why he was having chest pain and why he needed his medication TID. Pt did state that he would try xanax as above, but that his pharm said it needed an "override" by MD in order to fill it. Called pharmacy to find out what is needed and when pt p/up his Rxs. Pharmacist at [facility] stated that pt p/up #90 clonazepam on 04/03/12 and then tried to get a RF of clonazepam at another pharmacy this am, and it was not filled d[facility]t [facility] telling them when he last got it filled. Also pt got #90 Xanax on 03/20/12 and they have told pt that he can not have more until 04/19/12. Dr L, forwarding this to you FYI.

## 2012-04-12 NOTE — Telephone Encounter (Signed)
Patient should go to ED.  He is taking toxic amounts of medication

## 2012-04-15 ENCOUNTER — Encounter: Payer: Self-pay | Admitting: Family Medicine

## 2012-04-20 ENCOUNTER — Telehealth: Payer: Self-pay

## 2012-04-20 NOTE — Telephone Encounter (Signed)
Mailed patient a copy of his records per his request.

## 2012-04-20 NOTE — Telephone Encounter (Signed)
PT STATES HE WAS TOLD BY [place] TO FIND ANOTHER DR SO HE WOULD LIKE [place] TO MAIL A COPY OF HIS RECORDS TO HIS HOME, I ADVISED HIM THAT HE HAVE TO SIGN A RELEASE BUT HE STATED THE DR TOLD HIM HE DIDN'T HAVE TO, WE CAN JUST MAIL HIS RECORDS IF HE NEEDED  PLEASE CALL [phone] IF NEEDED

## 2012-05-04 ENCOUNTER — Telehealth: Payer: Self-pay

## 2012-05-05 ENCOUNTER — Telehealth: Payer: Self-pay
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
## 2012-05-19 ENCOUNTER — Telehealth: Payer: Self-pay

## 2012-05-19 NOTE — Telephone Encounter (Signed)
Pt CB again to ask about Rx for Xanax. After informing Alycia Rossetti that we had DCd pt from practice, he denied any RFs. Explained to pt that he will need to find another provider to Rx his medications and pharmacy had cancelled his RFs from our providers since we are no longer managing his Tx. Pt verbalized understanding.

## 2012-05-19 NOTE — Telephone Encounter (Signed)
Pt changed pharmacies and the script would not transfer on the xanax He is out and is in need - [facility] [place]

## 2012-05-19 NOTE — Telephone Encounter (Signed)
Please see my previous note

## 2012-05-19 NOTE — Telephone Encounter (Signed)
Pt calling again pt [facility] [address] in [place].

## 2012-05-19 NOTE — Telephone Encounter (Signed)
Please call old pharmacy and verify that he did not just pick up an Rx for Xanax. If he did not please cancel the remaining refills and we will Rx to the new pharmacy.

## 2012-06-01 ENCOUNTER — Telehealth: Payer: Self-pay

## 2012-06-01 NOTE — Telephone Encounter (Signed)
Pt is calling to get a refill on clozapam Please call pt to advis

## 2012-06-01 NOTE — Telephone Encounter (Signed)
Appears patient has been discharged from practice, correct?

## 2012-06-02 NOTE — Telephone Encounter (Signed)
Advised pt we cannot refill meds. Pt has been told numerous times to go to ER or some other Urgent Care because he has been dismissed from practice.

## 2012-06-02 NOTE — Telephone Encounter (Signed)
Correct, he has been discharged. We will no longer fill medications for him. Can offer the ER or a list of other PCP's.

## 2012-06-02 NOTE — Telephone Encounter (Signed)
No ans/no VM

## 2012-11-26 ENCOUNTER — Emergency Department (HOSPITAL_COMMUNITY)
Admission: EM | Admit: 2012-11-26 | Discharge: 2012-11-27 | Disposition: A | Discharge status: Other Acute Inpatient Hospital | Payer: Medicaid Other | Attending: Emergency Medicine | Admitting: Emergency Medicine

## 2012-11-26 ENCOUNTER — Encounter (HOSPITAL_COMMUNITY): Payer: Self-pay | Admitting: Emergency Medicine

## 2012-11-26 DIAGNOSIS — IMO0002 Reserved for concepts with insufficient information to code with codable children: Secondary | ICD-10-CM | POA: Insufficient documentation

## 2012-11-26 DIAGNOSIS — R45851 Suicidal ideations: Secondary | ICD-10-CM

## 2012-11-26 DIAGNOSIS — F32A Depression, unspecified: Secondary | ICD-10-CM

## 2012-11-26 DIAGNOSIS — F259 Schizoaffective disorder, unspecified: Secondary | ICD-10-CM | POA: Insufficient documentation

## 2012-11-26 DIAGNOSIS — F329 Major depressive disorder, single episode, unspecified: Secondary | ICD-10-CM

## 2012-11-26 DIAGNOSIS — Z79899 Other long term (current) drug therapy: Secondary | ICD-10-CM | POA: Insufficient documentation

## 2012-11-26 DIAGNOSIS — F1010 Alcohol abuse, uncomplicated: Secondary | ICD-10-CM

## 2012-11-26 DIAGNOSIS — Z8679 Personal history of other diseases of the circulatory system: Secondary | ICD-10-CM | POA: Insufficient documentation

## 2012-11-26 DIAGNOSIS — F3289 Other specified depressive episodes: Secondary | ICD-10-CM | POA: Insufficient documentation

## 2012-11-26 DIAGNOSIS — F1020 Alcohol dependence, uncomplicated: Secondary | ICD-10-CM | POA: Insufficient documentation

## 2012-11-26 DIAGNOSIS — Z8739 Personal history of other diseases of the musculoskeletal system and connective tissue: Secondary | ICD-10-CM | POA: Insufficient documentation

## 2012-11-26 DIAGNOSIS — F17200 Nicotine dependence, unspecified, uncomplicated: Secondary | ICD-10-CM | POA: Insufficient documentation

## 2012-11-26 HISTORY — DX: Depression, unspecified: F32.A

## 2012-11-26 HISTORY — DX: Major depressive disorder, single episode, unspecified: F32.9

## 2012-11-26 LAB — COMPREHENSIVE METABOLIC PANEL
ALT: 22 U/L (ref 0–53)
AST: 28 U/L (ref 0–37)
Albumin: 4.1 g/dL (ref 3.5–5.2)
Alkaline Phosphatase: 84 U/L (ref 39–117)
BUN: 5 mg/dL — ABNORMAL LOW (ref 6–23)
CO2: 24 mEq/L (ref 19–32)
Calcium: 9 mg/dL (ref 8.4–10.5)
Chloride: 105 mEq/L (ref 96–112)
Creatinine, Ser: 0.66 mg/dL (ref 0.50–1.35)
GFR calc Af Amer: >90 mL/min (ref >90)
GFR calc non Af Amer: >90 mL/min (ref >90)
Glucose, Bld: 103 mg/dL — ABNORMAL HIGH (ref 70–99)
Potassium: 3.7 mEq/L (ref 3.5–5.1)
Sodium: 143 mEq/L (ref 135–145)
Total Bilirubin: 0.2 mg/dL — ABNORMAL LOW (ref 0.3–1.2)
Total Protein: 7.3 g/dL (ref 6.0–8.3)

## 2012-11-26 LAB — SALICYLATE LEVEL: Salicylate Lvl: <2 mg/dL — ABNORMAL LOW (ref 2.8–20.0)

## 2012-11-26 LAB — CBC
HCT: 44.7 % (ref 39.0–52.0)
Hemoglobin: 15.8 g/dL (ref 13.0–17.0)
MCH: 32.2 pg (ref 26.0–34.0)
MCHC: 35.3 g/dL (ref 30.0–36.0)
MCV: 91 fL (ref 78.0–100.0)
Platelets: 248 10*3/uL (ref 150–400)
RBC: 4.91 MIL/uL (ref 4.22–5.81)
RDW: 15 % (ref 11.5–15.5)
WBC: 5.5 10*3/uL (ref 4.0–10.5)

## 2012-11-26 LAB — ACETAMINOPHEN LEVEL: Acetaminophen (Tylenol), Serum: <15 ug/mL (ref 10–30)

## 2012-11-26 LAB — ETHANOL: Alcohol, Ethyl (B): 316 mg/dL — ABNORMAL HIGH (ref 0–11)

## 2012-11-26 MED ORDER — ADULT MULTIVITAMIN W/MINERALS CH
1.0000 | ORAL_TABLET | Freq: Every day | ORAL | Status: DC
  Administered 2012-11-26: 1 via ORAL
  Filled 2012-11-26: qty 1

## 2012-11-26 MED ORDER — HALOPERIDOL 2 MG PO TABS
3.0000 mg | ORAL_TABLET | Freq: Two times a day (BID) | ORAL | Status: DC
  Administered 2012-11-26 (×2): 3 mg via ORAL
  Filled 2012-11-26 (×2): qty 1

## 2012-11-26 MED ORDER — TRAZODONE HCL 50 MG PO TABS
50.0000 mg | ORAL_TABLET | Freq: Every day | ORAL | Status: DC
  Administered 2012-11-26: 50 mg via ORAL
  Filled 2012-11-26: qty 1

## 2012-11-26 MED ORDER — LORAZEPAM 2 MG/ML IJ SOLN: 1.0000 mg | Freq: Four times a day (QID) | INTRAMUSCULAR | Status: DC | PRN

## 2012-11-26 MED ORDER — MOMETASONE FURO-FORMOTEROL FUM 200-5 MCG/ACT IN AERO
2.0000 | INHALATION_SPRAY | Freq: Two times a day (BID) | RESPIRATORY_TRACT | Status: DC
  Administered 2012-11-26: 2 via RESPIRATORY_TRACT
  Filled 2012-11-26: qty 8.8

## 2012-11-26 MED ORDER — ALUM & MAG HYDROXIDE-SIMETH 200-200-20 MG/5ML PO SUSP: 30.0000 mL | ORAL | Status: DC | PRN

## 2012-11-26 MED ORDER — ZOLPIDEM TARTRATE 5 MG PO TABS: 5.0000 mg | ORAL_TABLET | Freq: Every evening | ORAL | Status: DC | PRN

## 2012-11-26 MED ORDER — FOLIC ACID 1 MG PO TABS
1.0000 mg | ORAL_TABLET | Freq: Every day | ORAL | Status: DC
  Administered 2012-11-26: 1 mg via ORAL
  Filled 2012-11-26: qty 1

## 2012-11-26 MED ORDER — THIAMINE HCL 100 MG/ML IJ SOLN: 100.0000 mg | Freq: Every day | INTRAMUSCULAR | Status: DC

## 2012-11-26 MED ORDER — IBUPROFEN 200 MG PO TABS: 600.0000 mg | ORAL_TABLET | Freq: Three times a day (TID) | ORAL | Status: DC | PRN

## 2012-11-26 MED ORDER — OXCARBAZEPINE 300 MG PO TABS
600.0000 mg | ORAL_TABLET | Freq: Two times a day (BID) | ORAL | Status: DC
  Administered 2012-11-26 (×2): 600 mg via ORAL
  Filled 2012-11-26 (×4): qty 2

## 2012-11-26 MED ORDER — ALBUTEROL SULFATE HFA 108 (90 BASE) MCG/ACT IN AERS: 2.0000 | INHALATION_SPRAY | Freq: Four times a day (QID) | RESPIRATORY_TRACT | Status: DC | PRN

## 2012-11-26 MED ORDER — LORAZEPAM 1 MG PO TABS
1.0000 mg | ORAL_TABLET | Freq: Four times a day (QID) | ORAL | Status: DC | PRN
  Administered 2012-11-26: 1 mg via ORAL
  Filled 2012-11-26: qty 3

## 2012-11-26 MED ORDER — ONDANSETRON HCL 4 MG PO TABS: 4.0000 mg | ORAL_TABLET | Freq: Three times a day (TID) | ORAL | Status: DC | PRN

## 2012-11-26 MED ORDER — VITAMIN B-1 100 MG PO TABS
100.0000 mg | ORAL_TABLET | Freq: Every day | ORAL | Status: DC
  Administered 2012-11-26: 100 mg via ORAL
  Filled 2012-11-26: qty 1
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
## 2012-11-26 NOTE — ED Provider Notes (Signed)
History    52 year old male with depression and suicidal ideation. Patient is a chronic alcoholic. He states that he is tired of his life and how he has lived it. Having thoughts of shooting himself. Denies any significant increased stressors recently. No homicidal ideation. Patient states that he does occasionally hear voices of what sounds like people talking in the background but he cannot make out what they are saying. HE has not heard them recently though. Denies drug use.   CSN: [id number]  Arrival date & time 11/26/12  1232   First MD Initiated Contact with Patient 11/26/12 1242      Chief Complaint  Patient presents with  . Medical Clearance    (Consider location/radiation/quality/duration/timing/severity/associated sxs/prior treatment) HPI  Past Medical History  Diagnosis Date  . COPD (chronic obstructive pulmonary disease)   . Hypertension   . Hyperlipidemia   . Arthritis     Past Surgical History  Procedure Date  . Back surgery   . Neck surgery     No family history on file.  History  Substance Use Topics  . Smoking status: Current Every Day Smoker -- 1.0 packs/day for 30 years    Types: Cigarettes  . Smokeless tobacco: Not on file  . Alcohol Use: Yes     Comment: Pt drinks daily . unknown amount      Review of Systems  All systems reviewed and negative, other than as noted in HPI.  Allergies  Review of patient's allergies indicates no known allergies.  Home Medications   Current Outpatient Rx  Name  Route  Sig  Dispense  Refill  . ALBUTEROL SULFATE HFA 108 (90 BASE) MCG/ACT IN AERS   Inhalation   Inhale 2 puffs into the lungs every 6 (six) hours as needed. For shortness of breath.         Marland Kitchen DIAZEPAM 10 MG PO TABS   Oral   Take 5 mg by mouth every 12 (twelve) hours as needed. For anxiety.         Marland Kitchen HALOPERIDOL 2 MG PO TABS   Oral   Take 3 mg by mouth 2 (two) times daily.         . MOMETASONE FURO-FORMOTEROL FUM 200-5 MCG/ACT IN  AERO   Inhalation   Inhale 2 puffs into the lungs 2 (two) times daily.   1 Inhaler   3   . OXCARBAZEPINE 600 MG PO TABS   Oral   Take 600 mg by mouth 2 (two) times daily.         . TRAZODONE HCL 100 MG PO TABS   Oral   Take 50-100 mg by mouth at bedtime.            BP 137/92  Pulse 97  Temp 98.3 F (36.8 C) (Oral)  Resp 20  SpO2 97%  Physical Exam  Nursing note and vitals reviewed. Constitutional: He appears well-developed and well-nourished. No distress.  HENT:  Head: Normocephalic and atraumatic.  Eyes: Conjunctivae normal are normal. Right eye exhibits no discharge. Left eye exhibits no discharge.  Neck: Neck supple.  Cardiovascular: Normal rate, regular rhythm and normal heart sounds.  Exam reveals no gallop and no friction rub.   No murmur heard. Pulmonary/Chest: Effort normal and breath sounds normal. No respiratory distress.  Abdominal: Soft. He exhibits no distension. There is no tenderness.  Musculoskeletal: He exhibits no edema and no tenderness.  Neurological: He is alert. No cranial nerve deficit. He exhibits normal muscle tone.  Skin:  Skin is warm and dry. He is not diaphoretic.  Psychiatric: Thought content normal.       Clinically intoxicated. Patient is slurring his words but is understandable.    ED Course  Procedures (including critical care time)  Labs Reviewed  COMPREHENSIVE METABOLIC PANEL - Abnormal; Notable for the following:    Glucose, Bld 103 (*)     BUN 5 (*)     Total Bilirubin 0.2 (*)     All other components within normal limits  ETHANOL - Abnormal; Notable for the following:    Alcohol, Ethyl (B) 316 (*)     All other components within normal limits  SALICYLATE LEVEL - Abnormal; Notable for the following:    Salicylate Lvl <2.0 (*)     All other components within normal limits  ACETAMINOPHEN LEVEL  CBC  URINE RAPID DRUG SCREEN (HOSP PERFORMED)   No results found.   1. Suicidal ideation   2. Depression   3. Alcohol  abuse       MDM  52 year old male with long-standing alcohol abuse and suicidal ideation. Patient was placed on the alcohol withdrawal protocol. Will discuss with the ACT team.         Raeford Razor, MD 11/26/12 1525
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
## 2012-11-26 NOTE — ED Notes (Signed)
Pt to room 35 report to [place] in [facility]

## 2012-11-27 ENCOUNTER — Inpatient Hospital Stay (HOSPITAL_COMMUNITY)
Admission: AD | Admit: 2012-11-27 | Discharge: 2012-12-07 | DRG: 897 | Disposition: A | Discharge status: Home / Self Care | Payer: 59 | Source: Other Acute Inpatient Hospital | Attending: Psychiatry | Admitting: Psychiatry

## 2012-11-27 ENCOUNTER — Encounter (HOSPITAL_COMMUNITY): Payer: Self-pay | Admitting: *Deleted

## 2012-11-27 DIAGNOSIS — R45851 Suicidal ideations: Secondary | ICD-10-CM

## 2012-11-27 DIAGNOSIS — F1910 Other psychoactive substance abuse, uncomplicated: Secondary | ICD-10-CM

## 2012-11-27 DIAGNOSIS — F1010 Alcohol abuse, uncomplicated: Secondary | ICD-10-CM

## 2012-11-27 DIAGNOSIS — F411 Generalized anxiety disorder: Secondary | ICD-10-CM | POA: Diagnosis present

## 2012-11-27 DIAGNOSIS — J449 Chronic obstructive pulmonary disease, unspecified: Secondary | ICD-10-CM

## 2012-11-27 DIAGNOSIS — F1994 Other psychoactive substance use, unspecified with psychoactive substance-induced mood disorder: Secondary | ICD-10-CM

## 2012-11-27 DIAGNOSIS — F419 Anxiety disorder, unspecified: Secondary | ICD-10-CM | POA: Diagnosis present

## 2012-11-27 DIAGNOSIS — Z79899 Other long term (current) drug therapy: Secondary | ICD-10-CM

## 2012-11-27 DIAGNOSIS — F1020 Alcohol dependence, uncomplicated: Principal | ICD-10-CM | POA: Diagnosis present

## 2012-11-27 DIAGNOSIS — I10 Essential (primary) hypertension: Secondary | ICD-10-CM

## 2012-11-27 DIAGNOSIS — G8929 Other chronic pain: Secondary | ICD-10-CM

## 2012-11-27 DIAGNOSIS — J4489 Other specified chronic obstructive pulmonary disease: Secondary | ICD-10-CM | POA: Diagnosis present

## 2012-11-27 DIAGNOSIS — K219 Gastro-esophageal reflux disease without esophagitis: Secondary | ICD-10-CM

## 2012-11-27 LAB — RAPID URINE DRUG SCREEN, HOSP PERFORMED
Amphetamines: NOT DETECTED
Barbiturates: NOT DETECTED
Benzodiazepines: POSITIVE — AB
Cocaine: NOT DETECTED
Opiates: NOT DETECTED
Tetrahydrocannabinol: NOT DETECTED

## 2012-11-27 MED ORDER — HALOPERIDOL 1 MG PO TABS
3.0000 mg | ORAL_TABLET | Freq: Two times a day (BID) | ORAL | Status: DC
  Administered 2012-11-27 – 2012-12-02 (×11): 3 mg via ORAL
  Filled 2012-11-27 (×13): qty 3

## 2012-11-27 MED ORDER — INFLUENZA VIRUS VACC SPLIT PF IM SUSP: 0.5000 mL | INTRAMUSCULAR | Status: AC

## 2012-11-27 MED ORDER — VITAMIN B-1 100 MG PO TABS
100.0000 mg | ORAL_TABLET | Freq: Every day | ORAL | Status: DC
  Administered 2012-11-28 – 2012-12-07 (×10): 100 mg via ORAL
  Filled 2012-11-27 (×12): qty 1

## 2012-11-27 MED ORDER — MAGNESIUM HYDROXIDE 400 MG/5ML PO SUSP: 30.0000 mL | Freq: Every day | ORAL | Status: DC | PRN

## 2012-11-27 MED ORDER — DIAZEPAM 5 MG PO TABS
5.0000 mg | ORAL_TABLET | Freq: Three times a day (TID) | ORAL | Status: DC | PRN
  Administered 2012-11-27 – 2012-11-29 (×6): 5 mg via ORAL
  Filled 2012-11-27 (×6): qty 1

## 2012-11-27 MED ORDER — ACETAMINOPHEN 325 MG PO TABS
650.0000 mg | ORAL_TABLET | Freq: Four times a day (QID) | ORAL | Status: DC | PRN
  Administered 2012-11-28 – 2012-12-06 (×3): 650 mg via ORAL

## 2012-11-27 MED ORDER — THIAMINE HCL 100 MG/ML IJ SOLN: 100.0000 mg | Freq: Once | INTRAMUSCULAR | Status: DC

## 2012-11-27 MED ORDER — CHLORDIAZEPOXIDE HCL 25 MG PO CAPS
25.0000 mg | ORAL_CAPSULE | Freq: Four times a day (QID) | ORAL | Status: DC | PRN
  Administered 2012-11-27: 25 mg via ORAL
  Filled 2012-11-27: qty 1

## 2012-11-27 MED ORDER — ALBUTEROL SULFATE HFA 108 (90 BASE) MCG/ACT IN AERS: 2.0000 | INHALATION_SPRAY | Freq: Four times a day (QID) | RESPIRATORY_TRACT | Status: DC | PRN

## 2012-11-27 MED ORDER — ALUM & MAG HYDROXIDE-SIMETH 200-200-20 MG/5ML PO SUSP: 30.0000 mL | ORAL | Status: DC | PRN

## 2012-11-27 MED ORDER — NICOTINE 21 MG/24HR TD PT24
21.0000 mg | MEDICATED_PATCH | Freq: Every day | TRANSDERMAL | Status: DC
  Administered 2012-11-27 – 2012-11-29 (×3): 21 mg via TRANSDERMAL
  Filled 2012-11-27 (×4): qty 1

## 2012-11-27 MED ORDER — OXCARBAZEPINE 300 MG PO TABS
600.0000 mg | ORAL_TABLET | Freq: Two times a day (BID) | ORAL | Status: DC
  Administered 2012-11-27 – 2012-12-07 (×21): 600 mg via ORAL
  Filled 2012-11-27 (×4): qty 2
  Filled 2012-11-27: qty 56
  Filled 2012-11-27: qty 2
  Filled 2012-11-27: qty 56
  Filled 2012-11-27 (×4): qty 2
  Filled 2012-11-27: qty 56
  Filled 2012-11-27 (×13): qty 2
  Filled 2012-11-27: qty 56
  Filled 2012-11-27: qty 2

## 2012-11-27 MED ORDER — ONDANSETRON 4 MG PO TBDP
4.0000 mg | ORAL_TABLET | Freq: Four times a day (QID) | ORAL | Status: AC | PRN
  Administered 2012-11-27: 4 mg via ORAL

## 2012-11-27 MED ORDER — LOPERAMIDE HCL 2 MG PO CAPS
2.0000 mg | ORAL_CAPSULE | ORAL | Status: AC | PRN
  Administered 2012-11-27: 4 mg via ORAL
  Administered 2012-11-27 – 2012-11-28 (×2): 2 mg via ORAL

## 2012-11-27 MED ORDER — HYDROXYZINE HCL 25 MG PO TABS
25.0000 mg | ORAL_TABLET | Freq: Four times a day (QID) | ORAL | Status: AC | PRN
  Administered 2012-11-27 – 2012-11-28 (×3): 25 mg via ORAL

## 2012-11-27 MED ORDER — MOMETASONE FURO-FORMOTEROL FUM 200-5 MCG/ACT IN AERO: 2.0000 | INHALATION_SPRAY | Freq: Two times a day (BID) | RESPIRATORY_TRACT | Status: DC

## 2012-11-27 MED ORDER — OXCARBAZEPINE 300 MG PO TABS
600.0000 mg | ORAL_TABLET | Freq: Two times a day (BID) | ORAL | Status: DC
  Filled 2012-11-27 (×3): qty 2

## 2012-11-27 MED ORDER — MOMETASONE FURO-FORMOTEROL FUM 200-5 MCG/ACT IN AERO
2.0000 | INHALATION_SPRAY | Freq: Two times a day (BID) | RESPIRATORY_TRACT | Status: DC
  Administered 2012-11-27 – 2012-12-07 (×19): 2 via RESPIRATORY_TRACT
  Filled 2012-11-27: qty 8.8

## 2012-11-27 MED ORDER — TRAZODONE HCL 50 MG PO TABS
50.0000 mg | ORAL_TABLET | Freq: Every evening | ORAL | Status: DC | PRN
  Administered 2012-11-27 – 2012-11-28 (×2): 50 mg via ORAL
  Filled 2012-11-27 (×6): qty 1

## 2012-11-27 MED ORDER — TRAZODONE HCL 50 MG PO TABS: 50.0000 mg | ORAL_TABLET | Freq: Every evening | ORAL | Status: DC | PRN

## 2012-11-27 MED ORDER — CHLORDIAZEPOXIDE HCL 25 MG PO CAPS
25.0000 mg | ORAL_CAPSULE | Freq: Once | ORAL | Status: AC
  Administered 2012-11-27: 25 mg via ORAL
  Filled 2012-11-27: qty 1

## 2012-11-27 MED ORDER — ADULT MULTIVITAMIN W/MINERALS CH
1.0000 | ORAL_TABLET | Freq: Every day | ORAL | Status: DC
  Administered 2012-11-27 – 2012-12-07 (×11): 1 via ORAL
  Filled 2012-11-27 (×13): qty 1
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
## 2012-11-27 NOTE — [facility] Suicide Risk Assessment (Signed)
Suicide Risk Assessment  Admission Assessment     Nursing information obtained from:  Patient Demographic factors:  Male;Caucasian;Living alone;Unemployed Current Mental Status:  Suicidal ideation indicated by patient Patient notes occasional suicidal thougths. Some occasions seeing spots and hearing people's voices, and some notions of harming those who took some money out of his bank card, but no homicidal ideation. Patient engages with good eye contact, is able to focus adequately in a one to one setting, and has clear goal directed thoughts. Patient speaks with a natural conversational volume, rate, and tone. Anxiety was reported at 8.5 on a scale of 1 the least and 10 the most. Depression was reported at 8.5-9 on the same scale. Patient is oriented times 4, recent and remote memory intact. Judgement: limited by his mental illness and his addictive thinking. Insight: limited by his mental illness and his addictive thinking. Loss Factors:  Loss of significant relationship;Financial problems / change in socioeconomic status Historical Factors:  Prior suicide attempts;Family history of suicide Risk Reduction Factors:  NA  CLINICAL FACTORS:   Depression:   Comorbid alcohol abuse/dependence Alcohol/Substance Abuse/Dependencies Chronic Pain Previous Psychiatric Diagnoses and Treatments  COGNITIVE FEATURES THAT CONTRIBUTE TO RISK:  Closed-mindedness Thought constriction (tunnel vision)    SUICIDE RISK:   Moderate:  Frequent suicidal ideation with limited intensity, and duration, some specificity in terms of plans, no associated intent, good self-control, limited dysphoria/symptomatology, some risk factors present, and identifiable protective factors, including available and accessible social support.  PLAN OF CARE: Detox  Refer for long term rehab  I certify that inpatient care is necessary.  Jason Bean 11/27/2012, 1:38 PM

## 2012-11-27 NOTE — Tx Team (Signed)
Initial Interdisciplinary Treatment Plan  PATIENT STRENGTHS: (choose at least two) Capable of independent living Communication skills Financial means General fund of knowledge Motivation for treatment/growth  PATIENT STRESSORS: Loss of home* Substance abuse   PROBLEM LIST: Problem List/Patient Goals Date to be addressed Date deferred Reason deferred Estimated date of resolution  ETOH abuse 11/27/12     Depression with suicidal ideation 11/27/12                                                DISCHARGE CRITERIA:  Adequate post-discharge living arrangements Improved stabilization in mood, thinking, and/or behavior Verbal commitment to aftercare and medication compliance Withdrawal symptoms are absent or subacute and managed without 24-hour nursing intervention  PRELIMINARY DISCHARGE PLAN: Attend 12-step recovery group Placement in alternative living arrangements  PATIENT/FAMIILY INVOLVEMENT: This treatment plan has been presented to and reviewed with the patient, Jason, Bean 11/27/2012, 5:51 AM

## 2012-11-27 NOTE — Progress Notes (Signed)
Psychoeducational Group Note  Date:  11/27/2012 Time:  1300  Group Topic/Focus:  Making Healthy Choices:   The focus of this group is to help patients identify negative/unhealthy choices they were using prior to admission and identify positive/healthier coping strategies to replace them upon discharge.  Participation Level:  Minimal  Participation Quality:  Appropriate and Attentive  Affect:  Appropriate  Cognitive:  Alert and Appropriate  Insight:  Developing/Improving  Engagement in Group:  Developing/Improving  Additional Comments:  Cresenciano Lick 11/27/2012, 1:54 PM

## 2012-11-27 NOTE — Progress Notes (Signed)
Spoke with pt 1:1 who is complaining of withdrawal symptoms. CIWA is an 8 with slightly elevated BP. Pt's affect is anxious with congruent mood. Medicated for diarrhea, nausea and anxiety with some relief. Encouraged fluids, fall precautions reviewed. Support and encouragement given. Denies SI/HI/AVH and remains safe. Jason Bean

## 2012-11-27 NOTE — Progress Notes (Signed)
Psychoeducational Group Note  Date:  11/27/2012 Time:  0900  Group Topic/Focus:  Self Inventory Review  Participation Level:  Active  Participation Quality:  Appropriate, Drowsy and Sharing  Affect:  Depressed and Flat  Cognitive:  Appropriate  Insight:  Engaged  Engagement in Group:  Engaged  Additional Comments:  Pt concerned about PTA meds  Kameran Lallier Shari Prows 11/27/2012, 10:35 AM

## 2012-11-27 NOTE — [facility] Counselor (Signed)
Patient has been accepted at [facility] by Dr. Dub Mikes to the services of Dr. Jannifer Franklin, room 305.2.
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
## 2012-11-27 NOTE — ED Provider Notes (Signed)
Accepted at [facility].  Juliet Rude. Rubin Payor, MD 11/27/12 [phone]

## 2012-11-27 NOTE — Progress Notes (Addendum)
Patient ID: Jason Bean, male   DOB: Oct 17, 1961, 52 y.o.   MRN: [id number] D: Pt is awake and active on the unit this AM. Pt denies SI/HI and A/V hallucinations. Pt is participating in the milieu and is cooperative with staff. Pt did not complete his self inventory today after staff prompted pt. Pt mood is depressed and his affect is flat/sad. Pt's main concern is restarting his PTA medications. Pt's most recent CIWA score was 10.   A: Writer utilized therapeutic communication, encouraged pt to discuss feelings with staff and administered medication per MD orders. Writer also encouraged pt to attend groups. Pt is reserved and forwards little to staff at this time.  R: Pt is attending groups and tolerating medications well. Writer will continue to monitor. 15 minute checks are ongoing for safety.

## 2012-11-27 NOTE — Clinical Social Work Psychosocial (Signed)
[facility] Group Notes:  (Clinical Social Work)  11/27/2012  10:00-11:00AM  Summary of Progress/Problems:   The main focus of today's process group was for the patient to identify ways in which they have in the past sabotaged their own recovery and reasons they may have done this/what they received from doing it.  We then worked to identify a specific plan to avoid doing this when discharged from the hospital for this admission.  The patient listened throughout group, but did not participate actively.  When it was almost his turn to be called on for active participation, he was called out by the doctor.  Type of Therapy:  Group Therapy - Process  Participation Level:  None  Participation Quality:  Attentive  Affect:  Blunted and Depressed  Cognitive:  Appropriate  Insight:  None  Engagement in Therapy:  None  Modes of Intervention:  Clarification, Education, Limit-setting, Problem-solving, Socialization, Support and Processing, Exploration, Discussion   Ambrose Mantle, LCSW 11/27/2012, 1:00 PM

## 2012-11-27 NOTE — Progress Notes (Signed)
Patient did not attend the evening speaker AA meeting. Pt was notified of meeting but did not attend.

## 2012-11-28 MED ORDER — OSELTAMIVIR PHOSPHATE 75 MG PO CAPS
75.0000 mg | ORAL_CAPSULE | Freq: Two times a day (BID) | ORAL | Status: DC
  Administered 2012-11-28 – 2012-11-29 (×2): 75 mg via ORAL
  Filled 2012-11-28 (×4): qty 1

## 2012-11-28 MED ORDER — LORATADINE 10 MG PO TABS
10.0000 mg | ORAL_TABLET | Freq: Every day | ORAL | Status: DC
  Administered 2012-11-28 – 2012-12-07 (×10): 10 mg via ORAL
  Filled 2012-11-28 (×14): qty 1

## 2012-11-28 NOTE — Progress Notes (Signed)
Spoke with pt 1:1 who continues to complain of withdrawal symptoms and general malaise. Suspects it could be the flu and received treatment earlier which will be ongoing. Affect is blank, anxious with congruent mood. Cautious in his interactions. Support and encouragement given. Medicated per orders. Will continue to monitor for seizure activity due to hx. Denies SI/HI/AVH and remains safe. Lawrence Marseilles

## 2012-11-28 NOTE — Progress Notes (Signed)
Patient did attend the evening speaker AA meeting.  
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
## 2012-11-28 NOTE — Progress Notes (Signed)
Psychoeducational Group Note  Date:  11/28/2012 Time:  0900  Group Topic/Focus:  Self Inventory Review  Participation Level:  Active  Participation Quality:  Appropriate, Attentive and Drowsy  Affect:  Anxious, Appropriate and Blunted  Cognitive:  Alert and Appropriate  Insight:  Limited  Engagement in Group:  Engaged  Additional Comments:  Pt concerned about follow up care in long term tx facility.  Mikail Goostree Shari Prows 11/28/2012, 10:48 AM
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
## 2012-11-28 NOTE — Progress Notes (Signed)
Psychoeducational Group Note  Date:  11/28/2012 Time: 1515  Group Topic/Focus:  Conflict Resolution:   The focus of this group is to discuss the conflict resolution process and how it may be used upon discharge.  Participation Level:  Active  Participation Quality:  Appropriate, Sharing and Supportive  Affect:  Appropriate  Cognitive:  Appropriate  Insight:  Supportive  Engagement in Group:  Supportive  Additional Comments:  none  Loraina Stauffer M 11/28/2012, 4:55 PM

## 2012-11-28 NOTE — H&P (Signed)
Medical/psychiatric screening examination/treatment/procedure(s) were performed by non-physician practitioner and as supervising physician I was immediately available for consultation/collaboration.  I agree with the major elements of this evaluation. 

## 2012-11-29 DIAGNOSIS — F1020 Alcohol dependence, uncomplicated: Principal | ICD-10-CM

## 2012-11-29 MED ORDER — TRAZODONE HCL 100 MG PO TABS
100.0000 mg | ORAL_TABLET | Freq: Every evening | ORAL | Status: DC | PRN
  Administered 2012-11-29: 100 mg via ORAL
  Filled 2012-11-29 (×4): qty 1

## 2012-11-29 MED ORDER — PSEUDOEPHEDRINE HCL 30 MG PO TABS
30.0000 mg | ORAL_TABLET | Freq: Four times a day (QID) | ORAL | Status: DC | PRN
  Administered 2012-11-29 – 2012-11-30 (×3): 30 mg via ORAL
  Filled 2012-11-29 (×3): qty 1

## 2012-11-29 MED ORDER — NICOTINE 21 MG/24HR TD PT24
21.0000 mg | MEDICATED_PATCH | Freq: Every day | TRANSDERMAL | Status: DC
  Administered 2012-11-30 – 2012-12-06 (×7): 21 mg via TRANSDERMAL
  Filled 2012-11-29 (×10): qty 1

## 2012-11-29 NOTE — Progress Notes (Signed)
Patient ID: Jason Bean, male   DOB: [birth year], 52 y.o.   MRN: [id number] [facility] MD Progress Note  11/29/2012 10:26 AM Jason Bean  MRN:  [id number]  Subjective: "My mood is not good. Nothing has been going well since I came in here. I'm not sleeping well.  My appetite is down. My nose is running like a faucet. I don't want Tamiflu, I rather have sudafed instead. I cough intermittently, but a dry cough I suppose. Still suicidal, it comes and goes".  Diagnosis:  Axis 1 Alcohol Dependence   ADL's:  Intact  Sleep: Poor  Appetite:  Fair  Suicidal Ideation: "Ys, it comes and goes" Denies  Homicidal Ideation: "no" Denies   AEB (as evidenced by): per Patient's reports.  Psychiatric Specialty Exam: Review of Systems  Constitutional: Positive for malaise/fatigue.  Gastrointestinal: Positive for nausea and diarrhea.    Blood pressure 136/93, pulse 89, temperature 97.9 F (36.6 C), temperature source Oral, resp. rate 20, height 5\' 9"  (1.753 m), weight 83.915 kg (185 lb).Body mass index is 27.32 kg/(m^2).  General Appearance: Fairly Groomed  Eye Contact:  Good  Speech:  Clear and Coherent and Normal Rate  Volume:  Normal  Mood:  Depressed.  Affect:  Flat  Thought Process:  Coherent and intact.  Orientation:  Full (Time, Place, and Person)  Thought Content:  Denies hallucinations, delusions and or paranoia.  Suicidal Thoughts:  "Yes, it comes and goes"  Homicidal Thoughts:  No  Memory:  Intact   Judgement:  Impaired  Insight:  Shallow  Psychomotor Activity:  Normal  Concentration:  Good  Recall:  Good  Akathisia:  NA  Handed:  Right  AIMS (if indicated):     Assets:  Communication Skills Desire for Improvement Financial Resources/Insurance Physical Health Resilience Transportation  Sleep:  Number of Hours: 6.5    Current Medications: Current Facility-Administered Medications  Medication Dose Route Frequency Provider Last Rate Last Dose  . acetaminophen (TYLENOL)  tablet 650 mg  650 mg Oral Q6H PRN Rachael Fee, MD   650 mg at 11/28/12 2139  . albuterol (PROVENTIL HFA;VENTOLIN HFA) 108 (90 BASE) MCG/ACT inhaler 2 puff  2 puff Inhalation Q6H PRN Rachael Fee, MD      . albuterol (PROVENTIL HFA;VENTOLIN HFA) 108 (90 BASE) MCG/ACT inhaler 2 puff  2 puff Inhalation Q6H PRN Mickie D. Adams, PA      . alum & mag hydroxide-simeth (MAALOX/MYLANTA) 200-200-20 MG/5ML suspension 30 mL  30 mL Oral Q4H PRN Rachael Fee, MD      . haloperidol (HALDOL) tablet 3 mg  3 mg Oral BID Mickie D. Adams, PA   3 mg at 11/29/12 0817  . hydrOXYzine (ATARAX/VISTARIL) tablet 25 mg  25 mg Oral Q6H PRN Rachael Fee, MD   25 mg at 11/28/12 0414  . loperamide (IMODIUM) capsule 2-4 mg  2-4 mg Oral PRN Rachael Fee, MD   2 mg at 11/28/12 0846  . loratadine (CLARITIN) tablet 10 mg  10 mg Oral Daily Mickie D. Adams, PA   10 mg at 11/29/12 0817  . magnesium hydroxide (MILK OF MAGNESIA) suspension 30 mL  30 mL Oral Daily PRN Rachael Fee, MD      . mometasone-formoterol [facility]) 200-5 MCG/ACT inhaler 2 puff  2 puff Inhalation BID Rachael Fee, MD   2 puff at 11/29/12 [phone]  . multivitamin with minerals tablet 1 tablet  1 tablet Oral Daily Rachael Fee, MD   1  tablet at 11/29/12 0817  . nicotine (NICODERM CQ - dosed in mg/24 hours) patch 21 mg  21 mg Transdermal Daily Rachael Fee, MD   21 mg at 11/29/12 0817  . ondansetron (ZOFRAN-ODT) disintegrating tablet 4 mg  4 mg Oral Q6H PRN Rachael Fee, MD   4 mg at 11/27/12 1953  . Oxcarbazepine (TRILEPTAL) tablet 600 mg  600 mg Oral BID Rachael Fee, MD   600 mg at 11/29/12 0817  . pseudoephedrine (SUDAFED) tablet 30 mg  30 mg Oral Q6H PRN Sanjuana Kava, NP      . thiamine (B-1) injection 100 mg  100 mg Intramuscular Once Rachael Fee, MD      . thiamine (VITAMIN B-1) tablet 100 mg  100 mg Oral Daily Rachael Fee, MD   100 mg at 11/29/12 0816  . traZODone (DESYREL) tablet 50 mg  50 mg Oral QHS,MR X 1 Mickie D. Adams, PA   50 mg at 11/28/12  2139    Lab Results:  No results found for this or any previous visit (from the past 48 hour(s)).  Physical Findings: AIMS: Facial and Oral Movements Muscles of Facial Expression: None, normal Lips and Perioral Area: None, normal Jaw: None, normal Tongue: None, normal,Extremity Movements Upper (arms, wrists, hands, fingers): None, normal Lower (legs, knees, ankles, toes): None, normal, Trunk Movements Neck, shoulders, hips: None, normal, Overall Severity Severity of abnormal movements (highest score from questions above): None, normal Incapacitation due to abnormal movements: None, normal Patient's awareness of abnormal movements (rate only patient's report): No Awareness, Dental Status Current problems with teeth and/or dentures?: No Does patient usually wear dentures?: No  CIWA:  CIWA-Ar Total: 4  COWS:     Treatment Plan Summary: Daily contact with patient to assess and evaluate symptoms and progress in treatment Medication management  Plan:  Supportive approach/coping skills/relapse prevention. Discontinue Valium, may be going to a RTC after discharge. Discontinue Tamiflu, Start Sudafed 30 mg Q 6 hours prn. Increase Trazodone to 100 mg Q bedtime. Continue current treatment plan.   Medical Decision Making Problem Points:  Established problem, stable/improving (1), New problem, with no additional work-up planned (3), Review of last therapy session (1) and Review of psycho-social stressors (1) Data Points:  Review or order clinical lab tests (1) Review or order medicine tests (1) Review and summation of old records (2) Review of medication regiment & side effects (2) Review of new medications or change in dosage (2)  I certify that inpatient services furnished can reasonably be expected to improve the patient's condition.   Armandina Stammer I RPA-C CAQ-Psych  11/29/2012, 10:26 AM

## 2012-11-29 NOTE — Progress Notes (Signed)
Patient ID: Jason Bean, male   DOB: 03/27/1961, 52 y.o.   MRN: [id number] D  Pt. Denies any pain this shift.  He has minimal interaction but is friendly and cooperative with staff.  His anxiety appears to be less as the night goes on.  Pt. Staying to himself but is friendly to peers.   A  --  Support and encouragement given.   r   ---  Pt. Remains safe on unit

## 2012-11-29 NOTE — Clinical Social Work Note (Signed)
Interdisciplinary Treatment Plan (Adult)  Date: 11/29/2012  Time Reviewed: 10:10 AM   Progress in Treatment: Attending groups: Yes Participating in groups: Yes Taking medication as prescribed:  Yes Tolerating medication:  Yes Family/Significant othe contact made: CSW assessing for appropriate contact Patient understands diagnosis: Yes Discussing patient identified problems/goals with staff: Yes Medical problems stabilized or resolved:  Yes Denies suicidal/homicidal ideation: Yes to SI/ No to HI Issues/concerns per patient self-inventory: withdrawal symptoms including tremors, diarrhea, agitation, and chilling Other: N/A  New problem(s) identified: None Identified  Reason for Continuation of Hospitalization: Depression Withdrawal symptoms  Interventions implemented related to continuation of hospitalization: mood stabilization, medication monitoring and adjustment, group therapy and psycho education, suicide risk assessment, collateral contact, aftercare planning, ongoing physician assessments and safety checks q 15 mins  Additional comments: N/A  Estimated length of stay: 3-5 days  Discharge Plan:  CSW is assessing for appropriate referrals.   New goal(s): N/A  Review of initial/current patient goals per problem list:    1. Goal(s): Address suicidal ideation  Met: Yes  Target date: DC  As evidenced by: Patient report and psychian assessment 2. Goal (s): Reduce depressive symptoms from a 10 to a 3  Met: No  Target date: 2-3 days  As evidenced by: Pt rates at a 8 3. Goal(s): Identify comprehensive mental wellness and sobriety plan  Met: No  Target date:1/8 As evidenced WU:[id number] report, with support of CSW   Attendees: Physician:  Geoffery Lyons   11/29/2012  10:11AM Nursing:   Roswell Miners RN   11/29/2012  10:11AM Clinical Social Worker Ronda Fairly 11/29/2012  10:11AM Other:  Evonnie Pat, RN   11/29/2012  10:11AM   Scribe for Treatment Team:   Carney Bern,  LCSWA  11/29/2012 7:11 PM
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
## 2012-11-29 NOTE — Clinical Social Work Note (Signed)
[facility] LCSW Group Therapy   Type of Therapy:  Discharge Planning  Participation Level:  Minimal  Participation Quality:  Sharing  Affect:  Flat  Cognitive:  Oriented  Insight:  Limited  Engagement in Therapy:  Limited  Modes of Intervention:  Exploration, Rapport Building and Support  Summary of Progress/Problems:  Pt denied suicidal ideation yet endorses homicidal ideation.  On a scale of 1 to 10 with ten being the most ever experienced, the patient rates depression at a 8 and anxiety at a 6. Patient is asking for long term treatment. Patient obviously not feeling well today.    Clide Dales 11/29/2012 7:29 PM

## 2012-11-29 NOTE — Clinical Social Work Note (Signed)
[facility] LCSW Group Therapy   Type of Therapy:  Group Therapy  Participation Level:  Minimal  Participation Quality:  Drowsy and Resistant  Affect:  Depressed and Flat  Cognitive:  Oriented  Insight:  Limited  Engagement in Therapy:  Lacking  Modes of Intervention:  Exploration, Rapport Building and Socialization  Summary of Progress/Problems:  Pt shared that in order to avoid obstacles to his recovery he will "need to avoid old friends."  When patient was asked to process how he would handle that void in his day to day life he was unable to process anything other than "I guess I would drink."  Others in group were able to process need for filling voids left in life when using is stopped.  Jason Bean was very drowsy.   Clide Dales 11/29/2012 7:42 PM

## 2012-11-29 NOTE — Progress Notes (Signed)
Patient ID: Jason Bean, male   DOB: 24-Sep-1961, 52 y.o.   MRN: [id number] He has been up and to part of the groups interacting with peers and staff.  Self inventory: depression 8 hopelessness 10, SI thoughts constantly but contracts for safety.

## 2012-11-29 NOTE — Progress Notes (Signed)
Psychoeducational Group Note  Date:  11/29/2012 Time:  1100  Group Topic/Focus:  Wellness Toolbox:   The focus of this group is to discuss various aspects of wellness, balancing those aspects and exploring ways to increase the ability to experience wellness.  Patients will create a wellness toolbox for use upon discharge.  Participation Level:  Active  Participation Quality:  Appropriate and Attentive  Affect:  Appropriate  Cognitive:  Appropriate  Insight:  Did not share in group  Engagement in Group:  Engaged  Additional Comments:  Pt. Listened attentively and participated in group activity.   Ruta Hinds [facility] 11/29/2012, 1:42 PM

## 2012-11-30 MED ORDER — PSEUDOEPHEDRINE HCL 30 MG PO TABS
60.0000 mg | ORAL_TABLET | Freq: Four times a day (QID) | ORAL | Status: DC | PRN
  Administered 2012-11-30 – 2012-12-03 (×7): 60 mg via ORAL
  Filled 2012-11-30 (×2): qty 1
  Filled 2012-11-30 (×4): qty 2
  Filled 2012-11-30 (×2): qty 1

## 2012-11-30 MED ORDER — TRAZODONE HCL 100 MG PO TABS
150.0000 mg | ORAL_TABLET | Freq: Every day | ORAL | Status: DC
  Administered 2012-11-30 – 2012-12-06 (×7): 150 mg via ORAL
  Filled 2012-11-30: qty 21
  Filled 2012-11-30 (×4): qty 3
  Filled 2012-11-30: qty 21
  Filled 2012-11-30 (×4): qty 3

## 2012-11-30 NOTE — Progress Notes (Signed)
D:Pt reports that he is feeling less depressed after talking to his doctor and is considering going to [place] for long term treatment following discharge from [facility]. Pt is concerned about his appearance with his beard stating that it is too long to cut with razors from [facility]. Pt is requesting nail clippers. A:Supported pt to discuss feelings. MHT is searching for nail clippers.Offered encouragement and 15 minute checks. R:Pt denies si and hi. He denies hallucinations. Safety maintained on the unit.

## 2012-11-30 NOTE — Progress Notes (Signed)
Psychoeducational Group Note  Date:  11/30/2012 Time: 1000  Group Topic/Focus:  Pt participated in coping skills pictionary activity. Pt was asked to identify one coping skill they could use that they learned from the game.  Participation Level:  Active  Participation Quality:  Appropriate and Attentive  Affect:  Appropriate  Cognitive:  Appropriate  Insight:  Engaged  Engagement in Group:  Engaged  Additional Comments:  Jason Bean patcipated well with the activity, encouraging peers and writing on the board. One coping skill he is going to use when he gets out of the hospital is go to AA. Jason Bean 11/30/2012, 11:21 AM

## 2012-11-30 NOTE — Progress Notes (Signed)
Psychoeducational Group Note  Date:  11/30/2012 Time:  2000  Group Topic/Focus:  AA  Participation Level:  Minimal  Participation Quality:  Appropriate  Affect:  Blunted  Cognitive:  Alert  Insight:  Limited  Engagement in Group:  Limited  Additional Comments:    Jason Bean Monique 11/30/2012, 10:17 PM

## 2012-11-30 NOTE — Progress Notes (Signed)
Patient ID: NATION CRADLE, male   DOB: Nov 13, 1961, 52 y.o.   MRN: [id number]   PER STATE REGULATIONS 482.30  THIS CHART WAS REVIEWED FOR MEDICAL NECESSITY WITH RESPECT TO THE PATIENT'S ADMISSION/ DURATION OF STAY.  NEXT REVIEW DATE: 12/03/2012  Willa Rough, RN, BSN CASE MANAGER

## 2012-11-30 NOTE — Clinical Social Work Note (Signed)
[facility] LCSW Group Therapy   Type of Therapy:  Group Therapy  Participation Level:  Active  Participation Quality:  Attentive  Affect:  Appropriate  Cognitive:  Alert and Oriented  Engagement in Therapy:  Engaged  Modes of Intervention:  Discussion, Education, Rapport Building, Socialization and Support  Summary of Progress/Problems: Adynn attended group session facilitated by Agricultural consultant from [employer] [facility] [place]. Volunteer shared his experiences and services offered by [facility].  Clide Dales 11/30/2012 4:40 PM

## 2012-11-30 NOTE — Clinical Social Work Note (Signed)
Jason Bean agreeable to Dr Runell Gess suggestion to look at 90 day program at [facility] in [place].  ROI form signed and dated; requested information faxed to Admissions office at 3:00PM.  Carney Bern, LCSWA

## 2012-11-30 NOTE — Progress Notes (Signed)
[facility] MD Progress Note  11/30/2012 6:49 PM Jason Bean  MRN:  [id number] Subjective:  Jason Bean is having a hard time with upper respiratory symptoms, congestion. He had a hard time with sleep last night. He woke up at 3 AM, could not go back to sleep, and it was too late to get anything else. He is concerned about his prospects of being able to abstain if he was not to go to a long term place.  Diagnosis:  Alcohol Dependence, Anxiety Disorder NOS  ADL's:  Intact  Sleep: Poor  Appetite:  Fair  Suicidal Ideation:  Plan:  Denies Intent:  Denies Means:  Denies Homicidal Ideation:  Plan:  Denies Intent:  denies Means:  denies AEB (as evidenced by):  Psychiatric Specialty Exam: Review of Systems  Constitutional: Negative.   HENT: Positive for congestion.   Eyes: Negative.   Respiratory: Positive for cough.   Cardiovascular: Negative.   Gastrointestinal: Negative.   Genitourinary: Negative.   Musculoskeletal: Negative.   Skin: Negative.   Neurological: Negative.   Endo/Heme/Allergies: Negative.   Psychiatric/Behavioral: Positive for substance abuse. The patient is nervous/anxious and has insomnia.     Blood pressure 123/80, pulse 96, temperature 98.6 F (37 C), temperature source Oral, resp. rate 16, height 5\' 9"  (1.753 m), weight 83.915 kg (185 lb).Body mass index is 27.32 kg/(m^2).  General Appearance: Fairly Groomed  Patent attorney::  Fair  Speech:  Clear and Coherent, Slow and not spontaneous  Volume:  Decreased  Mood:  Anxious and worried  Affect:  Restricted  Thought Process:  Coherent and Goal Directed  Orientation:  Full (Time, Place, and Person)  Thought Content:  worries, concerns  Suicidal Thoughts:  No  Homicidal Thoughts:  No  Memory:  Immediate;   Fair Recent;   Fair Remote;   Fair  Judgement:  Fair  Insight:  Present  Psychomotor Activity:  Decreased  Concentration:  Fair  Recall:  Fair  Akathisia:  No  Handed:  Right  AIMS (if indicated):     Assets:   Desire for Improvement Housing Social Support  Sleep:  Number of Hours: 5.75    Current Medications: Current Facility-Administered Medications  Medication Dose Route Frequency Provider Last Rate Last Dose  . acetaminophen (TYLENOL) tablet 650 mg  650 mg Oral Q6H PRN Rachael Fee, MD   650 mg at 11/30/12 [phone]  . albuterol (PROVENTIL HFA;VENTOLIN HFA) 108 (90 BASE) MCG/ACT inhaler 2 puff  2 puff Inhalation Q6H PRN Rachael Fee, MD      . albuterol (PROVENTIL HFA;VENTOLIN HFA) 108 (90 BASE) MCG/ACT inhaler 2 puff  2 puff Inhalation Q6H PRN Mickie D. Adams, PA      . alum & mag hydroxide-simeth (MAALOX/MYLANTA) 200-200-20 MG/5ML suspension 30 mL  30 mL Oral Q4H PRN Rachael Fee, MD      . haloperidol (HALDOL) tablet 3 mg  3 mg Oral BID Mickie D. Adams, PA   3 mg at 11/30/12 1714  . loratadine (CLARITIN) tablet 10 mg  10 mg Oral Daily Mickie D. Adams, PA   10 mg at 11/30/12 0813  . magnesium hydroxide (MILK OF MAGNESIA) suspension 30 mL  30 mL Oral Daily PRN Rachael Fee, MD      . mometasone-formoterol [facility]) 200-5 MCG/ACT inhaler 2 puff  2 puff Inhalation BID Rachael Fee, MD   2 puff at 11/30/12 0813  . multivitamin with minerals tablet 1 tablet  1 tablet Oral Daily Rachael Fee, MD  1 tablet at 11/30/12 0813  . nicotine (NICODERM CQ - dosed in mg/24 hours) patch 21 mg  21 mg Transdermal Q0600 [name]   21 mg at 11/30/12 0625  . Oxcarbazepine (TRILEPTAL) tablet 600 mg  600 mg Oral BID Rachael Fee, MD   600 mg at 11/30/12 1714  . pseudoephedrine (SUDAFED) tablet 60 mg  60 mg Oral Q6H PRN Sanjuana Kava, NP   60 mg at 11/30/12 1318  . thiamine (B-1) injection 100 mg  100 mg Intramuscular Once Rachael Fee, MD      . thiamine (VITAMIN B-1) tablet 100 mg  100 mg Oral Daily Rachael Fee, MD   100 mg at 11/30/12 0813  . traZODone (DESYREL) tablet 150 mg  150 mg Oral QHS Rachael Fee, MD        Lab Results: No results found for this or any previous visit (from the past 48  hour(s)).  Physical Findings: AIMS: Facial and Oral Movements Muscles of Facial Expression: None, normal Lips and Perioral Area: None, normal Jaw: None, normal Tongue: None, normal,Extremity Movements Upper (arms, wrists, hands, fingers): None, normal Lower (legs, knees, ankles, toes): None, normal, Trunk Movements Neck, shoulders, hips: None, normal, Overall Severity Severity of abnormal movements (highest score from questions above): None, normal Incapacitation due to abnormal movements: None, normal Patient's awareness of abnormal movements (rate only patient's report): No Awareness, Dental Status Current problems with teeth and/or dentures?: No Does patient usually wear dentures?: No  CIWA:  CIWA-Ar Total: 0  COWS:     Treatment Plan Summary: Daily contact with patient to assess and evaluate symptoms and progress in treatment Medication management  Plan: Supportive approach/coping skills/relapse prevention           Pursue the detox            Increase the Trazodone to 150 mg hs           [facility] to [facility]  Medical Decision Making Problem Points:  Established problem, stable/improving (1), Review of last therapy session (1) and Review of psycho-social stressors (1) Data Points:  Review of medication regiment & side effects (2)  I certify that inpatient services furnished can reasonably be expected to improve the patient's condition.   Tanuj Mullens A 11/30/2012, 6:49 PM

## 2012-11-30 NOTE — Progress Notes (Signed)
Patient ID: Jason Bean, male   DOB: 20-Jun-1961, 52 y.o.   MRN: [id number] He has been up and to groups today interacting with peers and staff. Self inventory: depressed 8, hopeless 7, reports w/d symptoms( did not request PRN med s), continues to have SI thoughts off and on.

## 2012-11-30 NOTE — Clinical Social Work Note (Signed)
[facility] LCSW Group Therapy   Type of Therapy:  Discharge Planning  Participation Level:  Active  Participation Quality:  Appropriate  Affect:  Depressed and Flat  Cognitive:  Oriented  Insight:  Limited  Engagement in Therapy:  Improving  Modes of Intervention:  Clarification, Exploration and Support  Summary of Progress/Problems:  Pt denies both suicidal ideation and homicidal ideation.  On a scale of 1 to 10 with ten being the most ever experienced, the patient rates depression at a 7 and anxiety at a 8.  Jason Bean states that "this seems to be a harder than before." Jason Bean has made contact with [facility], and reports they are willing to help him with transportation.    Clide Dales 11/30/2012 10:12 AM

## 2012-11-30 NOTE — Progress Notes (Signed)
Psychoeducational Group Note  Date:  11/30/2012 Time:  1100  Group Topic/Focus:  Recovery Goals:   The focus of this group is to identify appropriate goals for recovery and establish a plan to achieve them.  Participation Level:  Active  Participation Quality:  Appropriate  Affect:  Appropriate  Cognitive:  Appropriate  Insight:  Engaged  Engagement in Group:  Engaged  Additional Comments:  Pt was appropriate and attentive while attending group. Pt shared that people, places, and things is what needs to be changed in his life. He has thought about relocation after leaving the facility.   Sharyn Lull 11/30/2012, 1:28 PM

## 2012-12-01 DIAGNOSIS — F411 Generalized anxiety disorder: Secondary | ICD-10-CM

## 2012-12-01 MED ORDER — FLUOXETINE HCL 20 MG PO CAPS
20.0000 mg | ORAL_CAPSULE | Freq: Every day | ORAL | Status: DC
  Administered 2012-12-01 – 2012-12-07 (×7): 20 mg via ORAL
  Filled 2012-12-01: qty 1
  Filled 2012-12-01: qty 14
  Filled 2012-12-01 (×2): qty 1
  Filled 2012-12-01: qty 14
  Filled 2012-12-01 (×5): qty 1

## 2012-12-01 NOTE — Clinical Social Work Note (Signed)
[facility] LCSW Group Therapy   Type of Therapy:  Group Therapy  Participation Level:  Minimal  Participation Quality:  Drowsy and Sharing  Affect:  Flat  Cognitive:  Oriented  Insight:  Limited  Engagement in Therapy:  Limited  Modes of Intervention:  Exploration and Socialization  Summary of Progress/Problems:  Pt was obviously drowsy after lunch and did not participate in discussion regarding emotional balance.  When called upon as he was nodding asleep patient roused and was willing to discuss upcoming telephone interview with treatment center in [place].  Clide Dales 12/01/2012 8:17 PM

## 2012-12-01 NOTE — Progress Notes (Signed)
Patient ID: Jason Bean, male   DOB: 04-25-1961, 52 y.o.   MRN: [id number] He has been up an to groups interacting with peers and  Staff has not requested any prn medications today denies w/d symptoms. Self Inventory: Depression 8, hopeless 6,  Some w/d symptoms, si thoughts off and on contraacts.

## 2012-12-01 NOTE — Clinical Social Work Note (Signed)
Patient had telephone interview with treatment center representative and conversation went well as reported by both parties. Morrie Sheldon from [facility] [phone] contacted CSW to report need for additional insurance information as patient shared with them he has [facility].  Request made to head nurse to get insurance card from patient's wallet where he reports it is and make copy.  Carney Bern, LCSWA Clinical Social Worker [phone]
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
## 2012-12-01 NOTE — Clinical Social Work Note (Signed)
Patient has phone interview with Admissions office at [facility] in [place] today at 2:30PM. Patient given phone number and time he is to call and general info regarding phone interview.  Carney Bern, LCSWA

## 2012-12-01 NOTE — Progress Notes (Signed)
Patient ID: Jason Bean, male   DOB: [birth year], 52 y.o.   MRN: [id number] [facility] MD Progress Note  12/01/2012 3:07 PM GEZA BERANEK  MRN:  [id number]  Subjective:  "I started to cough up a lot of phlegm. I guess the congestion is loosening up. My mood is still up and down. I slept well last night, but I still feel depressed. My depression today is at #7".  Diagnosis:  Alcohol Dependence, Anxiety Disorder NOS  ADL's:  Intact  Sleep: Poor  Appetite:  Fair  Suicidal Ideation: "No" Plan:  Denies Intent:  Denies Means:  Denies Homicidal Ideation: "No" Plan:  Denies Intent:  denies Means:  denies  AEB (as evidenced by): Per patient's reports.  Psychiatric Specialty Exam: Review of Systems  Constitutional: Negative.   HENT: Positive for congestion.   Eyes: Negative.   Respiratory: Positive for cough.   Cardiovascular: Negative.   Gastrointestinal: Negative.   Genitourinary: Negative.   Musculoskeletal: Negative.   Skin: Negative.   Neurological: Negative.   Endo/Heme/Allergies: Negative.   Psychiatric/Behavioral: Positive for depression and substance abuse. The patient is nervous/anxious and has insomnia.     Blood pressure 117/84, pulse 106, temperature 98.4 F (36.9 C), temperature source Oral, resp. rate 16, height 5\' 9"  (1.753 m), weight 83.915 kg (185 lb).Body mass index is 27.32 kg/(m^2).  General Appearance: Fairly Groomed  Patent attorney::  Fair  Speech:  Clear and Coherent, Slow and not spontaneous  Volume:  Decreased  Mood:  Anxious and worried  Affect:  Restricted  Thought Process:  Coherent and Goal Directed  Orientation:  Full (Time, Place, and Person)  Thought Content:  worries, concerns, denies hallucinations, delusions and or paranoia.  Suicidal Thoughts:  No  Homicidal Thoughts:  No  Memory:  Immediate;   Fair Recent;   Fair Remote;   Fair  Judgement:  Fair  Insight:  Present  Psychomotor Activity:  Decreased  Concentration:  Fair  Recall:  Fair   Akathisia:  No  Handed:  Right  AIMS (if indicated):     Assets:  Desire for Improvement Housing Social Support  Sleep:  Number of Hours: 6    Current Medications: Current Facility-Administered Medications  Medication Dose Route Frequency Provider Last Rate Last Dose  . acetaminophen (TYLENOL) tablet 650 mg  650 mg Oral Q6H PRN Rachael Fee, MD   650 mg at 11/30/12 [phone]  . albuterol (PROVENTIL HFA;VENTOLIN HFA) 108 (90 BASE) MCG/ACT inhaler 2 puff  2 puff Inhalation Q6H PRN Rachael Fee, MD      . albuterol (PROVENTIL HFA;VENTOLIN HFA) 108 (90 BASE) MCG/ACT inhaler 2 puff  2 puff Inhalation Q6H PRN Mickie D. Adams, PA      . alum & mag hydroxide-simeth (MAALOX/MYLANTA) 200-200-20 MG/5ML suspension 30 mL  30 mL Oral Q4H PRN Rachael Fee, MD      . FLUoxetine (PROZAC) capsule 20 mg  20 mg Oral Daily Sanjuana Kava, NP      . haloperidol (HALDOL) tablet 3 mg  3 mg Oral BID Mickie D. Adams, PA   3 mg at 12/01/12 0809  . loratadine (CLARITIN) tablet 10 mg  10 mg Oral Daily Mickie D. Adams, PA   10 mg at 12/01/12 0809  . magnesium hydroxide (MILK OF MAGNESIA) suspension 30 mL  30 mL Oral Daily PRN Rachael Fee, MD      . mometasone-formoterol [facility]) 200-5 MCG/ACT inhaler 2 puff  2 puff Inhalation BID Rachael Fee, MD  2 puff at 12/01/12 0808  . multivitamin with minerals tablet 1 tablet  1 tablet Oral Daily Rachael Fee, MD   1 tablet at 12/01/12 0809  . nicotine (NICODERM CQ - dosed in mg/24 hours) patch 21 mg  21 mg Transdermal Q0600 [name]   21 mg at 12/01/12 0600  . Oxcarbazepine (TRILEPTAL) tablet 600 mg  600 mg Oral BID Rachael Fee, MD   600 mg at 12/01/12 0809  . pseudoephedrine (SUDAFED) tablet 60 mg  60 mg Oral Q6H PRN Sanjuana Kava, NP   60 mg at 12/01/12 0603  . thiamine (B-1) injection 100 mg  100 mg Intramuscular Once Rachael Fee, MD      . thiamine (VITAMIN B-1) tablet 100 mg  100 mg Oral Daily Rachael Fee, MD   100 mg at 12/01/12 0809  . traZODone (DESYREL)  tablet 150 mg  150 mg Oral QHS Rachael Fee, MD   150 mg at 11/30/12 2157    Lab Results: No results found for this or any previous visit (from the past 48 hour(s)).  Physical Findings: AIMS: Facial and Oral Movements Muscles of Facial Expression: None, normal Lips and Perioral Area: None, normal Jaw: None, normal Tongue: None, normal,Extremity Movements Upper (arms, wrists, hands, fingers): None, normal Lower (legs, knees, ankles, toes): None, normal, Trunk Movements Neck, shoulders, hips: None, normal, Overall Severity Severity of abnormal movements (highest score from questions above): None, normal Incapacitation due to abnormal movements: None, normal Patient's awareness of abnormal movements (rate only patient's report): No Awareness, Dental Status Current problems with teeth and/or dentures?: No Does patient usually wear dentures?: No  CIWA:  CIWA-Ar Total: 0  COWS:     Treatment Plan Summary: Daily contact with patient to assess and evaluate symptoms and progress in treatment Medication management  Plan: Supportive approach/coping skills/relapse prevention. Initiated Prozac 20 mg daily for depression. [facility] to [facility]. Continue current plan of care.  Medical Decision Making Problem Points:  Established problem, stable/improving (1), Review of last therapy session (1) and Review of psycho-social stressors (1) Data Points:  Review of medication regiment & side effects (2)  I certify that inpatient services furnished can reasonably be expected to improve the patient's condition.   Armandina Stammer I 12/01/2012, 3:07 PM

## 2012-12-01 NOTE — Progress Notes (Signed)
Psychoeducational Group Note  Date:  12/01/2012 Time:  1100  Group Topic/Focus:  Personal Choices and Values:   The focus of this group is to help patients assess and explore the importance of values in their lives, how their values affect their decisions, how they express their values and what opposes their expression.  Participation Level:  Active  Participation Quality:  Appropriate, Attentive and Sharing  Affect:  Appropriate  Cognitive:  Appropriate  Insight:  Engaged  Engagement in Group:  Engaged  Additional Comments:Pt was appropriate and attentive while attending group. Pt shared that his top three values are having a close family, having control, and making a home. Pt also stated that he wanted to grow in his financial  security.   Sharyn Lull 12/01/2012, 1:01 PM

## 2012-12-01 NOTE — Progress Notes (Signed)
Patient ID: Jason Bean, male   DOB: October 14, 1961, 52 y.o.   MRN: [id number]   D: Pt was pleasant and cooperative. Informed the Clinical research associate that he'd had an interview with a representative from  [facility] in [place] today. Stated he was informed that it would cost 450.00 to per month. "I didn't know it was [place] cost that, but I can handle it".   A: Support and encouragement was offered. 15 min checks continued for safety.  R: Pt remains safe.

## 2012-12-02 DIAGNOSIS — F39 Unspecified mood [affective] disorder: Secondary | ICD-10-CM

## 2012-12-02 MED ORDER — HALOPERIDOL 5 MG PO TABS
5.0000 mg | ORAL_TABLET | Freq: Two times a day (BID) | ORAL | Status: DC
  Administered 2012-12-02 – 2012-12-07 (×10): 5 mg via ORAL
  Filled 2012-12-02: qty 1
  Filled 2012-12-02: qty 28
  Filled 2012-12-02 (×6): qty 1
  Filled 2012-12-02: qty 28
  Filled 2012-12-02 (×2): qty 1
  Filled 2012-12-02 (×2): qty 28
  Filled 2012-12-02 (×3): qty 1

## 2012-12-02 NOTE — Progress Notes (Signed)
D: Appears flat and depressed. Calm and cooperative with assessment. No acute distress noted. States he feels like he is continuing to improve. States his DCP is to go to long term treatment at [facility] in [place]. He feels like this is a good option as he needs continued support. Mentioned that there would be an out of pocket expense but he states he has the finances to afford it. Did c/o feeling like Haldol is making him tired. Encouraged to give it time so he can adjust and the drowsiness will subside. Rates his Depression a "6" and hopelessness a "7". Otherwise offered no questions or concerns. Denies SI/HI/AVH and contracts for safety.  A: Safety has been maintained with Q15 minute observation. Support and encouragement provided. POC and medications for the shift reviewed and understanding verbalized. Medications provided per MD order.   R: Pt is calm and cooperative. He feels like he is making good progress and is goal directed. He offers no questions or concerns. Will continue current POC and continue Q15 minute observation.
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
## 2012-12-02 NOTE — Progress Notes (Signed)
Psychoeducational Group Note  Date:  12/02/2012 Time:  2000  Group Topic/Focus: AA Meeting  Participation Level:  Active  Participation Quality:    Affect:    Cognitive:    Insight:    Engagement in Group:    Additional Comments:   Patient did attend AA meeting this evening.  Leemon Ayala, Newton Pigg 12/02/2012, 1:39 AM

## 2012-12-02 NOTE — Progress Notes (Signed)
[facility] MD Progress Note  12/02/2012 3:47 PM Jason Bean  MRN:  [id number] Subjective:  Jason Bean endorses that he is still having a hard time. His girlfriend is wanting him to go to a longer term rehab. [facility] will not take him because of his insurance. He is disappointed as he also thinks he needs to go somewhere. Not sure of his ability to abstain if he was going straight home. He is aware that he needs to do this work. He is still having problems with his mood as well as anxiety. He feels that his mood is very unstable " all over the place." Wants to see if we can increase the Haldol Diagnosis:   Axis I: Alcohol Dependence, Anxiety Disorder NOS, Mood Disorder NOS Axis II: Deferred Axis III:  Past Medical History  Diagnosis Date  . COPD (chronic obstructive pulmonary disease)   . Hypertension   . Hyperlipidemia   . Arthritis   . Depression    Axis IV: problems with primary support group Axis V: 51-60 moderate symptoms  ADL's:  Intact  Sleep: Fair  Appetite:  Fair  Suicidal Ideation:  Plan:  Denies Intent:  Denies Means:  Denies Homicidal Ideation:  Plan:  Denies Intent:  Denies Means:  Denies AEB (as evidenced by):  Psychiatric Specialty Exam: Review of Systems  Constitutional: Negative.   HENT: Positive for congestion.   Eyes: Negative.   Respiratory: Positive for cough.   Cardiovascular: Negative.   Gastrointestinal: Negative.   Genitourinary: Negative.   Musculoskeletal: Negative.   Skin: Negative.   Neurological: Negative.   Endo/Heme/Allergies: Negative.   Psychiatric/Behavioral: Positive for substance abuse. The patient is nervous/anxious.     Blood pressure 109/78, pulse 103, temperature 98.2 F (36.8 C), temperature source Oral, resp. rate 16, height 5\' 9"  (1.753 m), weight 83.915 kg (185 lb).Body mass index is 27.32 kg/(m^2).  General Appearance: Fairly Groomed  Patent attorney::  Fair  Speech:  Clear and Coherent and Slow  Volume:  Normal  Mood:   Anxious and agitated  Affect:  anxious, worried  Thought Process:  Coherent and Goal Directed  Orientation:  Full (Time, Place, and Person)  Thought Content:  WDL, Rumination and worries, concerns  Suicidal Thoughts:  No  Homicidal Thoughts:  No  Memory:  Immediate;   Fair Recent;   Fair Remote;   Fair  Judgement:  Fair  Insight:  Present  Psychomotor Activity:  Restlessness  Concentration:  Fair  Recall:  Fair  Akathisia:  No  Handed:  Right  AIMS (if indicated):     Assets:  Desire for Improvement  Sleep:  Number of Hours: 6.25    Current Medications: Current Facility-Administered Medications  Medication Dose Route Frequency Provider Last Rate Last Dose  . acetaminophen (TYLENOL) tablet 650 mg  650 mg Oral Q6H PRN Rachael Fee, MD   650 mg at 11/30/12 [phone]  . albuterol (PROVENTIL HFA;VENTOLIN HFA) 108 (90 BASE) MCG/ACT inhaler 2 puff  2 puff Inhalation Q6H PRN Rachael Fee, MD      . albuterol (PROVENTIL HFA;VENTOLIN HFA) 108 (90 BASE) MCG/ACT inhaler 2 puff  2 puff Inhalation Q6H PRN Mickie D. Adams, PA      . alum & mag hydroxide-simeth (MAALOX/MYLANTA) 200-200-20 MG/5ML suspension 30 mL  30 mL Oral Q4H PRN Rachael Fee, MD      . FLUoxetine (PROZAC) capsule 20 mg  20 mg Oral Daily Sanjuana Kava, NP   20 mg at 12/02/12 0804  .  haloperidol (HALDOL) tablet 5 mg  5 mg Oral BID Rachael Fee, MD      . loratadine (CLARITIN) tablet 10 mg  10 mg Oral Daily Mickie D. Adams, PA   10 mg at 12/02/12 0804  . magnesium hydroxide (MILK OF MAGNESIA) suspension 30 mL  30 mL Oral Daily PRN Rachael Fee, MD      . mometasone-formoterol [facility]) 200-5 MCG/ACT inhaler 2 puff  2 puff Inhalation BID Rachael Fee, MD   2 puff at 12/02/12 0804  . multivitamin with minerals tablet 1 tablet  1 tablet Oral Daily Rachael Fee, MD   1 tablet at 12/02/12 0804  . nicotine (NICODERM CQ - dosed in mg/24 hours) patch 21 mg  21 mg Transdermal Q0600 [name]   21 mg at 12/02/12 0600  . Oxcarbazepine  (TRILEPTAL) tablet 600 mg  600 mg Oral BID Rachael Fee, MD   600 mg at 12/02/12 0804  . pseudoephedrine (SUDAFED) tablet 60 mg  60 mg Oral Q6H PRN Sanjuana Kava, NP   60 mg at 12/02/12 0615  . thiamine (B-1) injection 100 mg  100 mg Intramuscular Once Rachael Fee, MD      . thiamine (VITAMIN B-1) tablet 100 mg  100 mg Oral Daily Rachael Fee, MD   100 mg at 12/02/12 0804  . traZODone (DESYREL) tablet 150 mg  150 mg Oral QHS Rachael Fee, MD   150 mg at 12/01/12 2205    Lab Results: No results found for this or any previous visit (from the past 48 hour(s)).  Physical Findings: AIMS: Facial and Oral Movements Muscles of Facial Expression: None, normal Lips and Perioral Area: None, normal Jaw: None, normal Tongue: None, normal,Extremity Movements Upper (arms, wrists, hands, fingers): None, normal Lower (legs, knees, ankles, toes): None, normal, Trunk Movements Neck, shoulders, hips: None, normal, Overall Severity Severity of abnormal movements (highest score from questions above): None, normal Incapacitation due to abnormal movements: None, normal Patient's awareness of abnormal movements (rate only patient's report): No Awareness, Dental Status Current problems with teeth and/or dentures?: No Does patient usually wear dentures?: No  CIWA:  CIWA-Ar Total: 0  COWS:     Treatment Plan Summary: Daily contact with patient to assess and evaluate symptoms and progress in treatment Medication management  Plan: Supportive approach/coping skills/relapse prevention           Increase the Haldol to 5 mg TID            Consider increasing the Trileptal Medical Decision Making Problem Points:  Established problem, worsening (2), Review of last therapy session (1) and Review of psycho-social stressors (1) Data Points:  Review of medication regiment & side effects (2) Review of new medications or change in dosage (2)  I certify that inpatient services furnished can reasonably be expected to  improve the patient's condition.   Myking Sar A 12/02/2012, 3:47 PM

## 2012-12-02 NOTE — Progress Notes (Signed)
Patient did attend the evening karaoke group. Pt was attentive and supportive.   

## 2012-12-02 NOTE — Clinical Social Work Note (Signed)
[facility] LCSW Group Therapy   Type of Therapy:  Discharge Planning  Participation Level:  Active  Participation Quality:  Attentive and Sharing  Affect:  Flat  Cognitive:  Alert and Oriented  Insight:  Limited  Engagement in Therapy:  Limited  Modes of Intervention:  Clarification, Exploration and Support  Summary of Progress/Problems: Jaree denies suicidal ideation and homicidal ideation.  When told that he would not be able to enter long term treatment in [place] patient was confused why his insurance would keep him from being ineligible. Education provided and Amear was able to report that insurance card is in his locker.    Clide Dales 12/02/2012 3:24 PM

## 2012-12-02 NOTE — Progress Notes (Signed)
Psychoeducational Group Note  Date:  12/02/2012 Time:  1100  Group Topic/Focus:  Rediscovering Joy:   The focus of this group is to explore various ways to relieve stress in a positive manner.  Participation Level:  Active  Participation Quality:  Appropriate  Affect:  Appropriate  Cognitive:  Appropriate  Insight:  Engaged  Engagement in Group:  Engaged  Additional Comments:  Pt shared positively with the group.  Jeannett Dekoning E 12/02/2012, 2:01 PM

## 2012-12-02 NOTE — Clinical Social Work Note (Signed)
CSW returned call to patient's case worker at [employer] [facility] [employer] and left voice mail.  Also spoke with [facility] clinical director in effort to arrange outpatient Substance Abuse Treatment   On 1.10 CSW contacted Ms Margo Aye once again and was told she would call back with patient status.  Did not hear from [facility]. CSW will refer patient to [facility] on Monday and later [facility] if [facility] does not work.   Clide Dales 12/03/2012 9:33 PM

## 2012-12-03 MED ORDER — GUAIFENESIN ER 600 MG PO TB12
600.0000 mg | ORAL_TABLET | Freq: Two times a day (BID) | ORAL | Status: AC
  Administered 2012-12-03 – 2012-12-05 (×5): 600 mg via ORAL
  Filled 2012-12-03 (×7): qty 1

## 2012-12-03 NOTE — Clinical Social Work Note (Signed)
Interdisciplinary Treatment Plan Update (Adult)  Date: 12/03/2012  Time Reviewed: 9:28 AM   Progress in Treatment: Attending groups: Yes Participating in groups: Yes Taking medication as prescribed:  Yes Tolerating medication:  Yes Family/Significant othe contact made: Yes  Patient understands diagnosis: Yes Discussing patient identified problems/goals with staff: Yes Medical problems stabilized or resolved:  Yes Denies suicidal/homicidal ideation: Yes Issues/concerns per patient self-inventory: Patient concerned re discharge plan as several plans have dissolved due to insurance coverage we were unaware of until Wednesday afternoon Other: N/A  New problem(s) identified: None Identified  Reason for Continuation of Hospitalization: Medication stabilization Withdrawal symptoms Depression  Interventions implemented related to continuation of hospitalization: mood stabilization, medication monitoring and adjustment, group therapy and psycho education, suicide risk assessment, collateral contact, aftercare planning, ongoing physician assessments and safety checks q 15 mins  Additional comments: N/A  Estimated length of stay: 3 days  Discharge Plan:  CSW is assessing for new more appropriate referrals as several plans have dissolved.   New goal(s): N/A  Review of initial/current patient goals per problem list:    1. Goal(s): Address suicidal ideation  Met: SA  As evidenced by:  2. Goal (s): Reduce depressive symptoms from a 10 to a 3  Met:Yes As evidenced by: Pt report 3.  Goal(s): Stabilize mood through use of medications and therapeutic milieu  Met:  No Target date: 3 days    Attendees: Patient:    Family:    Physician:  Geoffery Lyons   12/03/2012 9:28 AM  Nursing:   Skipper Cliche RN   12/03/2012 9:28 AM  Clinical Social Worker Ronda Fairly 12/03/2012 9:28 AM  Other:  Sanjuana Kava, NP   12/03/2012 9:28 AM  Other:  Illa Level, PA Intern  12/03/2012 9:28 AM  Other:             12/03/2012 9:28 AM  Other:       12/03/2012 9:28 AM   Scribe for Treatment Team:   Carney Bern, LCSWA  12/03/2012 9:28 AM

## 2012-12-03 NOTE — Clinical Social Work Note (Signed)
[facility] LCSW Group Therapy   Type of Therapy:  Group Therapy  Participation Level:  Minimal  Participation Quality:  Attentive  Affect:  Flat  Cognitive:  Alert and Oriented  Insight:  None shared  Engagement in Therapy:  Improving  Modes of Intervention:  Education  Summary of Progress/Problems:  Pt was attentive to educational information presented on Post Acute Withdrawal Syndrome. Jason Bean was able to share that this is hard for him to understand how to get through.  Jason Bean asked for copy of presentation material.   Clide Dales 12/03/2012 11:01 AM

## 2012-12-03 NOTE — Progress Notes (Signed)
D) Pt has attended the program and interacts with select peers. Denies SI and HI. Has complained of a cold most of today and was started on mucinex for his symptoms of stuffiness. Encouraged to speak with the doctor tomorrow to ask for some cough medication. A) Given support and reassurance along with appropriate praise. Encouraged to do his packet. R) Denies SI and HI.

## 2012-12-03 NOTE — Progress Notes (Signed)
Patient ID: Jason Bean, male   DOB: Aug 28, 1961, 52 y.o.   MRN: [id number] PER STATE REGULATIONS 482.30  THIS CHART WAS REVIEWED FOR MEDICAL NECESSITY WITH RESPECT TO THE PATIENT'S ADMISSION/ DURATION OF STAY.  NEXT REVIEW DATE: 12/06/2012  Willa Rough, RN, BSN CASE MANAGER

## 2012-12-03 NOTE — Progress Notes (Signed)
Psychoeducational Group Note  Date:  12/03/2012 Time:  1100am  Group Topic/Focus:  Healthy Communication:   The focus of this group is to discuss communication, barriers to communication, as well as healthy ways to communicate with others.  Participation Level:  Active  Participation Quality:  Appropriate  Affect:  Appropriate  Cognitive:  Appropriate  Insight:  Engaged  Engagement in Group:  Engaged  Additional Comments: participated   Nile Dear 12/03/2012, 11:07 AM

## 2012-12-03 NOTE — Progress Notes (Signed)
Patient ID: [name], male   DOB: April 29, 1961, 52 y.o.   MRN: [id number] D: No new data from previous assessment. Patient in bed sleeping. Respiration regular and unlabored. No sign of distress noted at this time A: 15 mins checks for  Safety. R: Patient remains asleep. Pt is safe.

## 2012-12-03 NOTE — Progress Notes (Signed)
Patient ID: Jason Bean, male   DOB: [birth year], 52 y.o.   MRN: [id number] [facility] MD Progress Note  12/03/2012 12:23 PM SHERRY ROGUS  MRN:  [id number]  Subjective:  Jason Bean reports increased cough and congestions. Thinks could not figure out why he could not get over this congestion. States "I feel fairly okay. Still not sure how to handle my issues. I know that I should go to a rehab place for a while after my discharge from here. Not sure if that is a possibility at this time. Denies SIHI".  Diagnosis:   Axis I: Alcohol Dependence, Anxiety Disorder NOS, Mood Disorder NOS Axis II: Deferred Axis III:  Past Medical History  Diagnosis Date  . COPD (chronic obstructive pulmonary disease)   . Hypertension   . Hyperlipidemia   . Arthritis   . Depression    Axis IV: problems with primary support group, Economic issues. Axis V: 51-60 moderate symptoms  ADL's:  Intact  Sleep: Fair  Appetite:  Fair  Suicidal Ideation: "No" Plan:  Denies Intent:  Denies Means:  Denies Homicidal Ideation: "No" Plan:  Denies Intent:  Denies Means:  Denies  AEB (as evidenced by): Per patient's reports.  Psychiatric Specialty Exam: Review of Systems  Constitutional: Negative.   HENT: Positive for congestion (Started on Mucinex today 12/03/12 x 5 days.).   Eyes: Negative.   Respiratory: Positive for cough.   Cardiovascular: Negative.   Gastrointestinal: Negative.   Genitourinary: Negative.   Musculoskeletal: Negative.   Skin: Negative.   Neurological: Negative.   Endo/Heme/Allergies: Negative.   Psychiatric/Behavioral: Positive for substance abuse. The patient is nervous/anxious.     Blood pressure 115/78, pulse 87, temperature 97.7 F (36.5 C), temperature source Oral, resp. rate 16, height 5\' 9"  (1.753 m), weight 83.915 kg (185 lb).Body mass index is 27.32 kg/(m^2).  General Appearance: Fairly Groomed  Patent attorney::  Fair  Speech:  Clear and Coherent and Slow  Volume:  Normal    Mood:  "I feel fairly okay"  Affect:  Concerned about chest congestion.  Thought Process:  Coherent and Goal Directed  Orientation:  Full (Time, Place, and Person)  Thought Content: Rumination and worries, concerns  Suicidal Thoughts:  No  Homicidal Thoughts:  No  Memory:  Immediate;   Fair Recent;   Fair Remote;   Fair  Judgement:  Fair  Insight:  Present  Psychomotor Activity:  Restlessness  Concentration:  Fair  Recall:  Fair  Akathisia:  No  Handed:  Right  AIMS (if indicated):     Assets:  Desire for Improvement  Sleep:  Number of Hours: 5.5    Current Medications: Current Facility-Administered Medications  Medication Dose Route Frequency Provider Last Rate Last Dose  . acetaminophen (TYLENOL) tablet 650 mg  650 mg Oral Q6H PRN Rachael Fee, MD   650 mg at 11/30/12 [phone]  . albuterol (PROVENTIL HFA;VENTOLIN HFA) 108 (90 BASE) MCG/ACT inhaler 2 puff  2 puff Inhalation Q6H PRN Rachael Fee, MD      . albuterol (PROVENTIL HFA;VENTOLIN HFA) 108 (90 BASE) MCG/ACT inhaler 2 puff  2 puff Inhalation Q6H PRN Mickie D. Adams, PA      . alum & mag hydroxide-simeth (MAALOX/MYLANTA) 200-200-20 MG/5ML suspension 30 mL  30 mL Oral Q4H PRN Rachael Fee, MD      . FLUoxetine (PROZAC) capsule 20 mg  20 mg Oral Daily Sanjuana Kava, NP   20 mg at 12/03/12 0825  . guaiFENesin (MUCINEX) 12  hr tablet 600 mg  600 mg Oral BID Sanjuana Kava, NP      . haloperidol (HALDOL) tablet 5 mg  5 mg Oral BID Rachael Fee, MD   5 mg at 12/03/12 0825  . loratadine (CLARITIN) tablet 10 mg  10 mg Oral Daily Mickie D. Adams, PA   10 mg at 12/03/12 0824  . magnesium hydroxide (MILK OF MAGNESIA) suspension 30 mL  30 mL Oral Daily PRN Rachael Fee, MD      . mometasone-formoterol [facility]) 200-5 MCG/ACT inhaler 2 puff  2 puff Inhalation BID Rachael Fee, MD   2 puff at 12/03/12 [phone]  . multivitamin with minerals tablet 1 tablet  1 tablet Oral Daily Rachael Fee, MD   1 tablet at 12/03/12 [phone]  . nicotine  (NICODERM CQ - dosed in mg/24 hours) patch 21 mg  21 mg Transdermal Q0600 [name]   21 mg at 12/03/12 0618  . Oxcarbazepine (TRILEPTAL) tablet 600 mg  600 mg Oral BID Rachael Fee, MD   600 mg at 12/03/12 5409  . thiamine (B-1) injection 100 mg  100 mg Intramuscular Once Rachael Fee, MD      . thiamine (VITAMIN B-1) tablet 100 mg  100 mg Oral Daily Rachael Fee, MD   100 mg at 12/03/12 0825  . traZODone (DESYREL) tablet 150 mg  150 mg Oral QHS Rachael Fee, MD   150 mg at 12/02/12 2212    Lab Results: No results found for this or any previous visit (from the past 48 hour(s)).  Physical Findings: AIMS: Facial and Oral Movements Muscles of Facial Expression: None, normal Lips and Perioral Area: None, normal Jaw: None, normal Tongue: None, normal,Extremity Movements Upper (arms, wrists, hands, fingers): None, normal Lower (legs, knees, ankles, toes): None, normal, Trunk Movements Neck, shoulders, hips: None, normal, Overall Severity Severity of abnormal movements (highest score from questions above): None, normal Incapacitation due to abnormal movements: None, normal Patient's awareness of abnormal movements (rate only patient's report): No Awareness, Dental Status Current problems with teeth and/or dentures?: No Does patient usually wear dentures?: No  CIWA:  CIWA-Ar Total: 0  COWS:  COWS Total Score: 0   Treatment Plan Summary: Daily contact with patient to assess and evaluate symptoms and progress in treatment Medication management  Plan: Supportive approach/coping skills/relapse prevention. Start Mucinex 600 mg bid x 5 doses for cough/congestion. Add cogentin 0.5 mg  Bid for prevention of EPS. Continue current plan of care.            Medical Decision Making Problem Points:  Established problem, worsening (2), Review of last therapy session (1) and Review of psycho-social stressors (1) Data Points:  Review of medication regiment & side effects (2) Review of new  medications or change in dosage (2)  I certify that inpatient services furnished can reasonably be expected to improve the patient's condition.   Armandina Stammer I 12/03/2012, 12:23 PM

## 2012-12-04 MED ORDER — BENZONATATE 100 MG PO CAPS
200.0000 mg | ORAL_CAPSULE | Freq: Three times a day (TID) | ORAL | Status: DC
  Administered 2012-12-04 – 2012-12-07 (×10): 200 mg via ORAL
  Filled 2012-12-04 (×15): qty 2

## 2012-12-04 NOTE — Clinical Social Work Psychosocial (Signed)
[facility] Group Notes:  (Clinical Social Work)  12/04/2012  10:00-11:00AM  Summary of Progress/Problems:   The main focus of today's process group was for the patient to identify ways in which they have in the past sabotaged their own recovery and what "good" they have received from drug/alcohol use such as overcoming shyness, forgetting pain, or eliminating worries.  We talked about what the "less good" results, as well as how motivated they are to make a change. The patient expressed that alcohol is his best friend, that it is the only way he can feel "normal", not stressed all the time.  He stated he is 99.9% motivated to change, and when asked why it was not 100, he said it should have been.  Type of Therapy:  Group Therapy - Process - Motivational Interviewing  Participation Level:  Active  Participation Quality:  Appropriate, Attentive and Sharing  Affect:  Appropriate  Cognitive:  Appropriate   Insight:  Engaged  Engagement in Therapy:  Engaged  Modes of Intervention:  Education, Teacher, English as a foreign language, Exploration, Discussion   Ambrose Mantle, LCSW 12/04/2012, 11:20 AM

## 2012-12-04 NOTE — Progress Notes (Signed)
D) Pt continues with cold symptoms, but less so than yesterday. Has attended the program and interacts with his peers appropriately. Rates his depression at a 7 and his hopelessness at an 8. Has thoughts of SI on and off. States that the medication is working and he no longer hearing voices. "I am not hearing them anymore they have stopped". Pt stated that he wants an extended rehab stay. Wanted to go to [place]. But his insurance will not pay for it.  A) Given support, reassurance and praise. Encouraged to continue to look for options for an outpatient rehab. Praised for his ability to attend and participate in group. R) Contracts for safety on the unit.

## 2012-12-04 NOTE — Progress Notes (Signed)
Psychoeducational Group Note  Date:  12/04/2012 Time:  1330 Group Topic/Focus:  Identifying Needs:   The focus of this group is to help patients identify their personal needs that have been historically problematic and identify healthy behaviors to address their needs.  Participation Level:  Minimal  Participation Quality:  Attentive  Affect:  Appropriate  Cognitive:  Oriented  Insight:  Engaged  Engagement in Group:  Engaged  Additional Comments:    12/04/2012,7:00 PM Marina Desire, Joie Bimler

## 2012-12-04 NOTE — Progress Notes (Signed)
Patient ID: Jason Bean, male   DOB: 1961-03-14, 52 y.o.   MRN: [id number] D. The patient spent most of the evening resting in bed. Stated he has a cold and feels congested. He left group early because he said he was disrupting group due to coughing. Denies any withdrawal symptoms. Denies any suicidal ideation. A. Encouraged patient to attend evening substance abuse meeting. Support and encouragement given. Discussed discharge plans with patient. R. The patient would like to attend a 30 day rehab program after discharge because he is afraid of relapsing. He is not sure this will be possible unless he can find a place covered by his insurance.

## 2012-12-04 NOTE — Progress Notes (Addendum)
[facility] MD Progress Note  12/04/2012 1:04 PM Jason Bean  MRN:  [id number]  Subjective:  Doing better not shaking no vomiting. Started [employer] today. Has [place] [facility] care and can't find a long term rehab that accepts this insurance. Still has no home to return to.  Looks much better from last weekend.  Diagnosis:   Axis I: Alcohol Dependence, Anxiety Disorder NOS, Mood Disorder NOS Axis II: Deferred Axis III:  Past Medical History  Diagnosis Date  . COPD (chronic obstructive pulmonary disease)   . Hypertension   . Hyperlipidemia   . Arthritis   . Depression    Axis IV: problems with primary support group, Economic issues. Axis V: 51-60 moderate symptoms  ADL's:  Intact  Sleep: Fair  Appetite:  Fair  Suicidal Ideation: "No" Plan:  Denies Intent:  Denies Means:  Denies Homicidal Ideation: "No" Plan:  Denies Intent:  Denies Means:  Denies  AEB (as evidenced by): Per patient's reports.  Psychiatric Specialty Exam: Review of Systems  Constitutional: Negative.   HENT: Positive for congestion (Started on Mucinex today 12/03/12 x 5 days.).   Eyes: Negative.   Respiratory: Positive for cough.   Cardiovascular: Negative.   Gastrointestinal: Negative.   Genitourinary: Negative.   Musculoskeletal: Negative.   Skin: Negative.   Neurological: Negative.   Endo/Heme/Allergies: Negative.   Psychiatric/Behavioral: Positive for substance abuse. The patient is nervous/anxious.     Blood pressure 123/80, pulse 91, temperature 97.3 F (36.3 C), temperature source Oral, resp. rate 16, height 5\' 9"  (1.753 m), weight 83.915 kg (185 lb).Body mass index is 27.32 kg/(m^2).  General Appearance: Fairly Groomed  Patent attorney::  Fair  Speech:  Clear and Coherent and Slow  Volume:  Normal  Mood:  "I feel fairly okay"  Affect:  Concerned about chest congestion.  Thought Process:  Coherent and Goal Directed  Orientation:  Full (Time, Place, and Person)  Thought Content:  Rumination and worries, concerns  Suicidal Thoughts:  No  Homicidal Thoughts:  No  Memory:  Immediate;   Fair Recent;   Fair Remote;   Fair  Judgement:  Fair  Insight:  Present  Psychomotor Activity:  Restlessness  Concentration:  Fair  Recall:  Fair  Akathisia:  No  Handed:  Right  AIMS (if indicated):     Assets:  Desire for Improvement  Sleep:  Number of Hours: 6.5    Current Medications: Current Facility-Administered Medications  Medication Dose Route Frequency Provider Last Rate Last Dose  . acetaminophen (TYLENOL) tablet 650 mg  650 mg Oral Q6H PRN Jason Fee, MD   650 mg at 11/30/12 [phone]  . albuterol (PROVENTIL HFA;VENTOLIN HFA) 108 (90 BASE) MCG/ACT inhaler 2 puff  2 puff Inhalation Q6H PRN Jason Fee, MD      . albuterol (PROVENTIL HFA;VENTOLIN HFA) 108 (90 BASE) MCG/ACT inhaler 2 puff  2 puff Inhalation Q6H PRN Jason D. Jordy Hewins, PA      . alum & mag hydroxide-simeth (MAALOX/MYLANTA) 200-200-20 MG/5ML suspension 30 mL  30 mL Oral Q4H PRN Jason Fee, MD      . benzonatate (TESSALON) capsule 200 mg  200 mg Oral TID Jason D. Hendry Speas, PA   200 mg at 12/04/12 5621  . FLUoxetine (PROZAC) capsule 20 mg  20 mg Oral Daily Jason Kava, NP   20 mg at 12/04/12 0829  . guaiFENesin (MUCINEX) 12 hr tablet 600 mg  600 mg Oral BID Jason Kava, NP   600 mg at  12/04/12 0828  . haloperidol (HALDOL) tablet 5 mg  5 mg Oral BID Jason Fee, MD   5 mg at 12/04/12 4098  . loratadine (CLARITIN) tablet 10 mg  10 mg Oral Daily Jason D. [place] Spainhower, PA   10 mg at 12/04/12 0828  . magnesium hydroxide (MILK OF MAGNESIA) suspension 30 mL  30 mL Oral Daily PRN Jason Fee, MD      . mometasone-formoterol [facility]) 200-5 MCG/ACT inhaler 2 puff  2 puff Inhalation BID Jason Fee, MD   2 puff at 12/04/12 [phone]  . multivitamin with minerals tablet 1 tablet  1 tablet Oral Daily Jason Fee, MD   1 tablet at 12/04/12 [phone]  . nicotine (NICODERM CQ - dosed in mg/24 hours) patch 21 mg  21 mg Transdermal  Q0600 Jason Bean   21 mg at 12/04/12 0640  . Oxcarbazepine (TRILEPTAL) tablet 600 mg  600 mg Oral BID Jason Kava, NP   600 mg at 12/04/12 0829  . thiamine (B-1) injection 100 mg  100 mg Intramuscular Once Jason Fee, MD      . thiamine (VITAMIN B-1) tablet 100 mg  100 mg Oral Daily Jason Fee, MD   100 mg at 12/04/12 0829  . traZODone (DESYREL) tablet 150 mg  150 mg Oral QHS Jason Fee, MD   150 mg at 12/03/12 2129    Lab Results: No results found for this or any previous visit (from the past 48 hour(s)).  Physical Findings: AIMS: Facial and Oral Movements Muscles of Facial Expression: None, normal Lips and Perioral Area: None, normal Jaw: None, normal Tongue: None, normal,Extremity Movements Upper (arms, wrists, hands, fingers): None, normal Lower (legs, knees, ankles, toes): None, normal, Trunk Movements Neck, shoulders, hips: None, normal, Overall Severity Severity of abnormal movements (highest score from questions above): None, normal Incapacitation due to abnormal movements: None, normal Patient's awareness of abnormal movements (rate only patient's report): No Awareness, Dental Status Current problems with teeth and/or dentures?: No Does patient usually wear dentures?: No  CIWA:  CIWA-Ar Total: 0  COWS:  COWS Total Score: 0   Treatment Plan Summary: Daily contact with patient to assess and evaluate symptoms and progress in treatment Medication management  Plan: Supportive approach/coping skills/relapse prevention. Start Mucinex 600 mg bid x 5 doses for cough/congestion. Add cogentin 0.5 mg  Bid for prevention of EPS. Continue current plan of care.            Medical Decision Making Problem Points:  Established problem, worsening (2), Review of last therapy session (1) and Review of psycho-social stressors (1) Data Points:  Review of medication regiment & side effects (2) Review of new medications or change in dosage (2)  I certify that inpatient  services furnished can reasonably be expected to improve the patient's condition.   Jason Bean,Jason D.RPA-C Bean  12/04/2012, 1:04 PM

## 2012-12-05 LAB — CBC WITH DIFFERENTIAL/PLATELET
Basophils Relative: 1 % (ref 0–1)
Eosinophils Absolute: 0.3 10*3/uL (ref 0.0–0.7)
Eosinophils Relative: 4 % (ref 0–5)
Hemoglobin: 13.6 g/dL (ref 13.0–17.0)
Lymphs Abs: 2.8 10*3/uL (ref 0.7–4.0)
MCH: 31.9 pg (ref 26.0–34.0)
MCHC: 33.8 g/dL (ref 30.0–36.0)
MCV: 94.4 fL (ref 78.0–100.0)
Monocytes Absolute: 0.9 10*3/uL (ref 0.1–1.0)
Neutrophils Relative %: 42 % — ABNORMAL LOW (ref 43–77)
RBC: 4.26 MIL/uL (ref 4.22–5.81)

## 2012-12-05 MED ORDER — INFLUENZA VIRUS VACC SPLIT PF IM SUSP
0.5000 mL | INTRAMUSCULAR | Status: AC
  Administered 2012-12-06: 0.5 mL via INTRAMUSCULAR

## 2012-12-05 NOTE — Progress Notes (Signed)
Patient did attend the evening speaker AA meeting.  

## 2012-12-05 NOTE — Clinical Social Work Psychosocial (Signed)
[facility] Group Notes:  (Clinical Social Work)  12/05/2012  10:00-11:00AM  Summary of Progress/Problems:   The main focus of today's process group was for the patient to define "support" and describe what healthy supports are, then to identify the patient's current support system and decide on other supports that can be put in place to prevent future hospitalizations.   An emphasis was placed on using therapist, doctor and problem-specific support groups to expand supports.  The patient expressed a high level of confidence in his ACT Team, stating that they are a good support to him.  He recommended that others consider this type of service.  He said they are there for him, are supportive all the time.  He asked CSW whether it was lined up for him to go to [facility].  CSW looked into Epic, and told him that his regular CSW will be calling [facility] on Monday looking for a bed, and if that did not work out, would look at [facility].    Type of Therapy:  Process Group  Participation Level:  Active  Participation Quality:  Appropriate, Attentive and Sharing  Affect:  Appropriate  Cognitive:  Appropriate  Insight:  Engaged  Engagement in Therapy:  Engaged  Modes of Intervention:  Clarification, Education, Limit-setting, Problem-solving, Socialization, Support and Processing, Exploration, Discussion, Role-Play   Ambrose Mantle, LCSW 12/05/2012, 12:44 PM

## 2012-12-05 NOTE — Progress Notes (Signed)
D) Pt has attended the groups and interact with his peers. Continues to not have a plan after discharge. Rates his depression at a 5 and his hopelessness at a 4. Denies SI and HI. Continues to complain of cold symptoms, but overall is not coughing as much. A) Given support, reassurance and praise. Encouraged to rest when able due to his cold symptoms.  R) Denies SI and HI.

## 2012-12-05 NOTE — Progress Notes (Signed)
Late entry for 12-04-12 GOALS GROUP   Date: 12/05/2012 Time:  1015  Group Topic/Focus:  GOALS GROUP. This group helps patients learn the way to set a goal that is attainable, and can be assessed at the end of the day.  Participation Level:  Active  Participation Quality:  Appropriate  Affect:  Appropriate  Cognitive:  Alert  Insight:  Engaged  Engagement in Group:  Engaged  Additional Comments:    Jason Bean A  

## 2012-12-05 NOTE — Progress Notes (Signed)
[facility] Group Notes:  (Counselor/Nursing/MHT/Case Management/Adjunct)  12/04/2012 2100 Type of Therapy:  wrap up  Participation Level:  Minimal  Participation Quality:  Appropriate and Attentive  Affect:  Appropriate and Flat  Cognitive:  Appropriate  Insight:  unknown to this Clinical research associate  Engagement in Group:  Engaged  Engagement in Therapy:  unknown to this Lawyer of Intervention:  Clarification, Armed forces technical officer of Progress/Problems: Pt pleased he received cold medicine.  Reported enjoying earlier group on identifying needs and hopes to attend further treatment at [facility].   Shelah Lewandowsky 12/05/2012, 4:01 AM

## 2012-12-05 NOTE — Progress Notes (Signed)
Group Topic/Focus:  Making Healthy Choices:   The focus of this group is to help patients identify negative/unhealthy choices they were using prior to admission and identify positive/healthier coping strategies to replace them upon discharge.  Participation Level:  Active  Participation Quality:  Appropriate  Affect:  Appropriate  Cognitive:  Appropriate  Insight:  Engaged  Engagement in Group:  Engaged  Additional Comments:    Dione Housekeeper 12/05/2012

## 2012-12-05 NOTE — Progress Notes (Addendum)
[facility] MD Progress Note  12/05/2012 12:02 PM Jason Bean  MRN:  [id number]  Subjective:  Now he is complaining of the stomach virus. Vomited after breakfast. C/O cough but I haven't heard him cough once the past 2 days.   Diagnosis:   Axis I: Alcohol Dependence, Anxiety Disorder NOS, Mood Disorder NOS Axis II: Deferred Axis III:  Past Medical History  Diagnosis Date  . COPD (chronic obstructive pulmonary disease)   . Hypertension   . Hyperlipidemia   . Arthritis   . Depression    Axis IV: problems with primary support group, Economic issues. Axis V: 51-60 moderate symptoms  ADL's:  Intact  Sleep: Fair  Appetite:  Fair  Suicidal Ideation: "No" Plan:  Denies Intent:  Denies Means:  Denies Homicidal Ideation: "No" Plan:  Denies Intent:  Denies Means:  Denies  AEB (as evidenced by): Per patient's reports.  Psychiatric Specialty Exam: Review of Systems  Constitutional: Negative.   HENT: Positive for congestion (Started on Mucinex today 12/03/12 x 5 days.).   Eyes: Negative.   Respiratory: Positive for cough.   Cardiovascular: Negative.   Gastrointestinal: Negative.   Genitourinary: Negative.   Musculoskeletal: Negative.   Skin: Negative.   Neurological: Negative.   Endo/Heme/Allergies: Negative.   Psychiatric/Behavioral: Positive for substance abuse. The patient is nervous/anxious.     Blood pressure 111/77, pulse 80, temperature 97.2 F (36.2 C), temperature source Oral, resp. rate 15, height 5\' 9"  (1.753 m), weight 83.915 kg (185 lb).Body mass index is 27.32 kg/(m^2).  General Appearance: Fairly Groomed  Patent attorney::  Fair  Speech:  Clear and Coherent and Slow  Volume:  Normal  Mood:  "I feel fairly okay"  Affect:  Concerned about chest congestion.  Thought Process:  Coherent and Goal Directed  Orientation:  Full (Time, Place, and Person)  Thought Content: Rumination and worries, concerns  Suicidal Thoughts:  No  Homicidal Thoughts:  No  Memory:   Immediate;   Fair Recent;   Fair Remote;   Fair  Judgement:  Fair  Insight:  Present  Psychomotor Activity:  Restlessness  Concentration:  Fair  Recall:  Fair  Akathisia:  No  Handed:  Right  AIMS (if indicated):     Assets:  Desire for Improvement  Sleep:  Number of Hours: 6    Current Medications: Current Facility-Administered Medications  Medication Dose Route Frequency Provider Last Rate Last Dose  . acetaminophen (TYLENOL) tablet 650 mg  650 mg Oral Q6H PRN Rachael Fee, MD   650 mg at 11/30/12 [phone]  . albuterol (PROVENTIL HFA;VENTOLIN HFA) 108 (90 BASE) MCG/ACT inhaler 2 puff  2 puff Inhalation Q6H PRN Rachael Fee, MD      . albuterol (PROVENTIL HFA;VENTOLIN HFA) 108 (90 BASE) MCG/ACT inhaler 2 puff  2 puff Inhalation Q6H PRN Mickie D. Taysean Wager, PA      . alum & mag hydroxide-simeth (MAALOX/MYLANTA) 200-200-20 MG/5ML suspension 30 mL  30 mL Oral Q4H PRN Rachael Fee, MD      . benzonatate (TESSALON) capsule 200 mg  200 mg Oral TID Mickie D. Fabiano Ginley, PA   200 mg at 12/05/12 0549  . FLUoxetine (PROZAC) capsule 20 mg  20 mg Oral Daily Sanjuana Kava, NP   20 mg at 12/05/12 0815  . guaiFENesin (MUCINEX) 12 hr tablet 600 mg  600 mg Oral BID Sanjuana Kava, NP   600 mg at 12/05/12 0814  . haloperidol (HALDOL) tablet 5 mg  5 mg Oral BID  Rachael Fee, MD   5 mg at 12/05/12 0815  . influenza  inactive virus vaccine (FLUZONE/FLUARIX) injection 0.5 mL  0.5 mL Intramuscular Tomorrow-1000 Mickie D. Audie Stayer, PA      . loratadine (CLARITIN) tablet 10 mg  10 mg Oral Daily Mickie D. Justyne Roell, PA   10 mg at 12/05/12 0814  . magnesium hydroxide (MILK OF MAGNESIA) suspension 30 mL  30 mL Oral Daily PRN Rachael Fee, MD      . mometasone-formoterol [facility]) 200-5 MCG/ACT inhaler 2 puff  2 puff Inhalation BID Rachael Fee, MD   2 puff at 12/05/12 0815  . multivitamin with minerals tablet 1 tablet  1 tablet Oral Daily Rachael Fee, MD   1 tablet at 12/05/12 0815  . nicotine (NICODERM CQ - dosed in mg/24  hours) patch 21 mg  21 mg Transdermal Q0600 [name]   21 mg at 12/05/12 0550  . Oxcarbazepine (TRILEPTAL) tablet 600 mg  600 mg Oral BID Sanjuana Kava, NP   600 mg at 12/05/12 0814  . thiamine (B-1) injection 100 mg  100 mg Intramuscular Once Rachael Fee, MD      . thiamine (VITAMIN B-1) tablet 100 mg  100 mg Oral Daily Rachael Fee, MD   100 mg at 12/05/12 0815  . traZODone (DESYREL) tablet 150 mg  150 mg Oral QHS Rachael Fee, MD   150 mg at 12/04/12 2135    Lab Results: No results found for this or any previous visit (from the past 48 hour(s)).  Physical Findings: AIMS: Facial and Oral Movements Muscles of Facial Expression: None, normal Lips and Perioral Area: None, normal Jaw: None, normal Tongue: None, normal,Extremity Movements Upper (arms, wrists, hands, fingers): None, normal Lower (legs, knees, ankles, toes): None, normal, Trunk Movements Neck, shoulders, hips: None, normal, Overall Severity Severity of abnormal movements (highest score from questions above): None, normal Incapacitation due to abnormal movements: None, normal Patient's awareness of abnormal movements (rate only patient's report): No Awareness, Dental Status Current problems with teeth and/or dentures?: No Does patient usually wear dentures?: No  CIWA:  CIWA-Ar Total: 0  COWS:  COWS Total Score: 0   Treatment Plan Summary: Daily contact with patient to assess and evaluate symptoms and progress in treatment Medication management  Plan: Supportive approach/coping skills/relapse prevention. Start Mucinex 600 mg bid x 5 doses for cough/congestion. Add cogentin 0.5 mg  Bid for prevention of EPS. Continue current plan of care. Will check CBC to make sure we are not missing an infection.             Medical Decision Making Problem Points:  Established problem, worsening (2), Review of last therapy session (1) and Review of psycho-social stressors (1) Data Points:  Review of medication regiment &  side effects (2) Review of new medications or change in dosage (2)  I certify that inpatient services furnished can reasonably be expected to improve the patient's condition.   Brittane Grudzinski,MICKIE D.RPA-C CAQ-Psych  12/05/2012, 12:02 PM

## 2012-12-05 NOTE — Progress Notes (Signed)
Date: 12/05/2012 Time:  1015  Group Topic/Focus:  Identifying Needs:   To reflect on some of the things, people and talents that we have in our lives, that we are grateful for.  Participation Level:  Active  Participation Quality:  Appropriate  Affect:  Appropriate  Cognitive:  Appropriate  Insight:  Engaged  Engagement in Group:  Engaged  Additional Comments:     

## 2012-12-05 NOTE — Progress Notes (Signed)
Patient ID: Jason Bean, male   DOB: 02/23/1961, 52 y.o.   MRN: [id number] D. The patient has a depressed mood and blunted affect. Interacting appropriately in the milieu. C/o cold symptoms. Stated he has not been told when or where he will go after discharge, but plans on going to a rehab.  A. Verbal support and praise given for his commitment to achieve sobriety. Encouraged to attend all groups. R. Attended and actively participated in evening wrap up group.

## 2012-12-06 NOTE — Progress Notes (Signed)
Patient ID: Jason Bean, male   DOB: Oct 16, 1961, 52 y.o.   MRN: [id number] D. The patient has a depressed mood and flat affect. Interacts in the milieu. Attending all groups. Continues to exhibit cold symptoms. A. Support and encouragement provided. Encouraged to attend evening substance abuse group. Medications administered. Compliant with medication. Denies suicidal ideation.

## 2012-12-06 NOTE — Progress Notes (Signed)
Adult Psychoeducational Group Note  Date:  12/06/2012 Time:  1100  Group Topic/Focus:  Self Care:   The focus of this group is to help patients understand the importance of self-care in order to improve or restore emotional, physical, spiritual, interpersonal, and financial health.  Participation Level:  Active  Participation Quality:  Appropriate and Attentive  Affect:  Appropriate  Cognitive:  Appropriate  Insight: Appropriate  Engagement in Group:  Engaged  Modes of Intervention:  Education  Additional Comments:  Staff explained to the patient that this group is designed to assist in identifying activities that they will incorporate into their daily living with the intention of improving or restoring emotional, physical, spiritual, interpersonal and financial health. The patients were asked to identify one area or skill where they are taking care of themselves. Patients will identify 2-3 activities of self- care that they will use in their daily living after discharge. Patients were encouraged to utilize the skills and techniques taught and apply it in their daily routine.    Ardelle Park O 12/06/2012, 3:24 PM

## 2012-12-06 NOTE — Progress Notes (Signed)
[facility] MD Progress Note  12/06/2012 4:23 PM Jason Bean  MRN:  [id number] Subjective:  Still anxious. Not sure where he is going from here. He will probably have to go to a shelter and be active in an intensive outpatient program. He does not have a stable place to go. Claims no family support. He gets anxious, worried that if he is at the shelter and she has to leave during the day this could set the stage for a relapse Diagnosis:   Axis I: Alcohol Dependence, Mood disorder NOS, Anxiety Disorder NOS Axis II: Deferred Axis III:  Past Medical History  Diagnosis Date  . COPD (chronic obstructive pulmonary disease)   . Hypertension   . Hyperlipidemia   . Arthritis   . Depression    Axis IV: housing problems and problems with primary support group Axis V: 51-60 moderate symptoms  ADL's:  Intact  Sleep: Fair  Appetite:  Fair  Suicidal Ideation:  Plan:  Denies Intent:  Denies Means:  Denies Homicidal Ideation:  Plan:  Denies Intent:  Denies Means:  Denies AEB (as evidenced by):  Psychiatric Specialty Exam: Review of Systems  Constitutional: Negative.   HENT: Negative.   Eyes: Negative.   Respiratory: Negative.   Cardiovascular: Negative.   Gastrointestinal: Negative.   Genitourinary: Negative.   Musculoskeletal: Negative.   Skin: Negative.   Neurological: Negative.   Endo/Heme/Allergies: Negative.   Psychiatric/Behavioral: Positive for depression and substance abuse. The patient is nervous/anxious.     Blood pressure 122/78, pulse 79, temperature 97.6 F (36.4 C), temperature source Oral, resp. rate 16, height 5\' 9"  (1.753 m), weight 83.915 kg (185 lb).Body mass index is 27.32 kg/(m^2).  General Appearance: Fairly Groomed  Patent attorney::  Fair  Speech:  Clear and Coherent, Slow and not spontaneous  Volume:  Decreased  Mood:  Anxious and worried  Affect:  Restricted  Thought Process:  Coherent and Goal Directed  Orientation:  Full (Time, Place, and Person)    Thought Content:  worries  Suicidal Thoughts:  No  Homicidal Thoughts:  No  Memory:  Immediate;   Fair Recent;   Fair Remote;   Fair  Judgement:  Fair  Insight:  Present  Psychomotor Activity:  Decreased  Concentration:  Fair  Recall:  Fair  Akathisia:  No  Handed:  Right  AIMS (if indicated):     Assets:  Desire for Improvement  Sleep:  Number of Hours: 6    Current Medications: Current Facility-Administered Medications  Medication Dose Route Frequency Provider Last Rate Last Dose  . acetaminophen (TYLENOL) tablet 650 mg  650 mg Oral Q6H PRN Rachael Fee, MD   650 mg at 12/06/12 1543  . albuterol (PROVENTIL HFA;VENTOLIN HFA) 108 (90 BASE) MCG/ACT inhaler 2 puff  2 puff Inhalation Q6H PRN Rachael Fee, MD      . albuterol (PROVENTIL HFA;VENTOLIN HFA) 108 (90 BASE) MCG/ACT inhaler 2 puff  2 puff Inhalation Q6H PRN Mickie D. Adams, PA      . alum & mag hydroxide-simeth (MAALOX/MYLANTA) 200-200-20 MG/5ML suspension 30 mL  30 mL Oral Q4H PRN Rachael Fee, MD      . benzonatate (TESSALON) capsule 200 mg  200 mg Oral TID Mickie D. Adams, PA   200 mg at 12/06/12 1152  . FLUoxetine (PROZAC) capsule 20 mg  20 mg Oral Daily Sanjuana Kava, NP   20 mg at 12/06/12 0815  . haloperidol (HALDOL) tablet 5 mg  5 mg Oral BID Madie Reno  Jorja Loa, MD   5 mg at 12/06/12 0815  . loratadine (CLARITIN) tablet 10 mg  10 mg Oral Daily Mickie D. Adams, PA   10 mg at 12/06/12 0815  . magnesium hydroxide (MILK OF MAGNESIA) suspension 30 mL  30 mL Oral Daily PRN Rachael Fee, MD      . mometasone-formoterol [facility]) 200-5 MCG/ACT inhaler 2 puff  2 puff Inhalation BID Rachael Fee, MD   2 puff at 12/06/12 0815  . multivitamin with minerals tablet 1 tablet  1 tablet Oral Daily Rachael Fee, MD   1 tablet at 12/06/12 0815  . nicotine (NICODERM CQ - dosed in mg/24 hours) patch 21 mg  21 mg Transdermal Q0600 [name]   21 mg at 12/06/12 0548  . Oxcarbazepine (TRILEPTAL) tablet 600 mg  600 mg Oral BID Sanjuana Kava, NP   600 mg at 12/06/12 0816  . thiamine (B-1) injection 100 mg  100 mg Intramuscular Once Rachael Fee, MD      . thiamine (VITAMIN B-1) tablet 100 mg  100 mg Oral Daily Rachael Fee, MD   100 mg at 12/06/12 0815  . traZODone (DESYREL) tablet 150 mg  150 mg Oral QHS Rachael Fee, MD   150 mg at 12/05/12 2111    Lab Results:  Results for orders placed during the hospital encounter of 11/27/12 (from the past 48 hour(s))  CBC WITH DIFFERENTIAL     Status: Abnormal   Collection Time   12/05/12  7:27 PM      Component Value Range Comment   WBC 7.1  4.0 - 10.5 K/uL WHITE COUNT CONFIRMED ON SMEAR   RBC 4.26  4.22 - 5.81 MIL/uL    Hemoglobin 13.6  13.0 - 17.0 g/dL    HCT 16.1  09.6 - 04.5 %    MCV 94.4  78.0 - 100.0 fL    MCH 31.9  26.0 - 34.0 pg    MCHC 33.8  30.0 - 36.0 g/dL    RDW 40.9  81.1 - 91.4 %    Platelets 254  150 - 400 K/uL    Neutrophils Relative 42 (*) 43 - 77 %    Lymphocytes Relative 40  12 - 46 %    Monocytes Relative 13 (*) 3 - 12 %    Eosinophils Relative 4  0 - 5 %    Basophils Relative 1  0 - 1 %    Neutro Abs 3.0  1.7 - 7.7 K/uL    Lymphs Abs 2.8  0.7 - 4.0 K/uL    Monocytes Absolute 0.9  0.1 - 1.0 K/uL    Eosinophils Absolute 0.3  0.0 - 0.7 K/uL    Basophils Absolute 0.1  0.0 - 0.1 K/uL    Smear Review MORPHOLOGY UNREMARKABLE       Physical Findings: AIMS: Facial and Oral Movements Muscles of Facial Expression: None, normal Lips and Perioral Area: None, normal Jaw: None, normal Tongue: None, normal,Extremity Movements Upper (arms, wrists, hands, fingers): None, normal Lower (legs, knees, ankles, toes): None, normal, Trunk Movements Neck, shoulders, hips: None, normal, Overall Severity Severity of abnormal movements (highest score from questions above): None, normal Incapacitation due to abnormal movements: None, normal Patient's awareness of abnormal movements (rate only patient's report): No Awareness, Dental Status Current problems with teeth  and/or dentures?: No Does patient usually wear dentures?: No  CIWA:  CIWA-Ar Total: 0  COWS:  COWS Total Score: 0   Treatment  Plan Summary: Daily contact with patient to assess and evaluate symptoms and progress in treatment Medication management  Plan: Supportive approach/coping skills/relapse prevention           Continue medications           Explore other outpatient follow up options Medical Decision Making Problem Points:  Established problem, worsening (2) and Review of psycho-social stressors (1) Data Points:  Review of medication regiment & side effects (2)  I certify that inpatient services furnished can reasonably be expected to improve the patient's condition.   Jilberto Vanderwall A 12/06/2012, 4:23 PM

## 2012-12-06 NOTE — Progress Notes (Signed)
[facility] Adult Case Management Discharge Plan :  Will you be returning to the same living situation after discharge: No. Patient will be going to [facility] shelter in [place], [place] At discharge, do you have transportation home?:Yes,  patient given two bus vouchers Do you have the ability to pay for your medications:Yes,    Release of information consent forms completed and in the chart;  Patient's signature needed at discharge.  Patient to Follow up at: Follow-up Information    Follow up with [facility]. (Go to walkin clinic on Tuesday 1/14, or Wednesday 1/15 at the latest, between 8AM and 3PM for 90 minutes assessment; they will then get you scheduled into Substance Abuse IOP Program and followup for medications)    Contact information:   [address]  [place], [place] [address] PH [phone] FAX [phone]         Patient denies SI/HI:   Yes,      Safety Planning and Suicide Prevention discussed:  Yes,  with patient  Clide Dales 12/06/2012, 3:19 PM

## 2012-12-06 NOTE — Progress Notes (Signed)
[facility] LCSW Group Therapy   Type of Therapy:  Group Therapy at 1:15  Participation Level:  Minimal  Participation Quality:  Asleep     Jason Bean 12/06/2012 10:58 PM

## 2012-12-06 NOTE — Clinical Social Work Note (Signed)
[facility] LCSW Group Therapy   Type of Therapy:  Discharge planning  Participation Level:  Minimal  Participation Quality:  Sharing  Affect:  Flat, depressed  Cognitive:  Alert and oriented  Insight: Limited  Engagement in Therapy: Limited  Modes of Intervention:  Exploration, discussion, clarification  Summary of Progress/Problems:  Pt denies both suicidal and homicidal ideation.  On a scale of 1 to 10 with ten being the most ever experienced, the patient rates depression at a 8and anxiety at a 8. Patient disappointed [facility] is not possibility but is okay with discharge to shelter.  CSW will access IOP programming for follow up once patient decides if going to [facility] or [facility].    Clide Dales 12/06/2012 10:51 PM

## 2012-12-06 NOTE — Progress Notes (Signed)
[facility] INPATIENT:  Family/Significant Other Suicide Prevention Education  Suicide Prevention Education:  Family/Significant Other Refusal to Support Patient after Discharge:  Suicide Prevention Education Not Provided:   With written consent of the patient, two attempts were made to provide Suicide Prevention Education to [facility], [phone]. This person indicates he/she will not be responsible for the patient after discharge. Writer provided suicide prevention education directly to patient; conversation included risk factors, warning signs and resources to contact for help. Mobile crisis services explained and contact card placed in chart for pt to receive at discharge.   Clide Dales 12/06/2012,4:17 PM

## 2012-12-06 NOTE — Progress Notes (Signed)
Patient ID: Jason Bean, male   DOB: 1961-06-20, 52 y.o.   MRN: [id number] He has been up and to groups today and interacting with peers and staff. Did not fill out his self inventory. Has not requested any PRN's.

## 2012-12-06 NOTE — Progress Notes (Signed)
Adult Psychoeducational Group Note  Date:  12/06/2012 Time: 2015  Group Topic/Focus:  The Sober Life--My Behavior  Participation Level:  Minimal  Participation Quality:  Appropriate  Affect:  Flat  Cognitive:  Oriented  Insight: Limited  Engagement in Group:  Limited  Modes of Intervention:    Additional Comments:   Humberto Seals Monique 12/06/2012, 11:41 PM

## 2012-12-07 MED ORDER — BENZONATATE 200 MG PO CAPS: 200.0000 mg | ORAL_CAPSULE | Freq: Two times a day (BID) | ORAL | Status: DC | PRN

## 2012-12-07 MED ORDER — DM-GUAIFENESIN ER 30-600 MG PO TB12: 1.0000 | ORAL_TABLET | Freq: Two times a day (BID) | ORAL | Status: DC

## 2012-12-07 MED ORDER — FLUOXETINE HCL 20 MG PO CAPS: 20.0000 mg | ORAL_CAPSULE | Freq: Every day | ORAL | Status: DC

## 2012-12-07 MED ORDER — MOMETASONE FURO-FORMOTEROL FUM 200-5 MCG/ACT IN AERO: 2.0000 | INHALATION_SPRAY | Freq: Two times a day (BID) | RESPIRATORY_TRACT | Status: DC

## 2012-12-07 MED ORDER — DM-GUAIFENESIN ER 30-600 MG PO TB12
1.0000 | ORAL_TABLET | Freq: Two times a day (BID) | ORAL | Status: DC
  Filled 2012-12-07 (×2): qty 20

## 2012-12-07 MED ORDER — BENZONATATE 100 MG PO CAPS
200.0000 mg | ORAL_CAPSULE | Freq: Two times a day (BID) | ORAL | Status: DC | PRN
  Filled 2012-12-07: qty 20

## 2012-12-07 MED ORDER — ALBUTEROL SULFATE HFA 108 (90 BASE) MCG/ACT IN AERS: 2.0000 | INHALATION_SPRAY | Freq: Four times a day (QID) | RESPIRATORY_TRACT | Status: DC | PRN

## 2012-12-07 MED ORDER — OXCARBAZEPINE 600 MG PO TABS: 600.0000 mg | ORAL_TABLET | Freq: Two times a day (BID) | ORAL | Status: DC

## 2012-12-07 MED ORDER — TRAZODONE HCL 150 MG PO TABS: 150.0000 mg | ORAL_TABLET | Freq: Every day | ORAL | Status: DC

## 2012-12-07 MED ORDER — HALOPERIDOL 5 MG PO TABS: 5.0000 mg | ORAL_TABLET | Freq: Two times a day (BID) | ORAL | Status: DC

## 2012-12-07 NOTE — Progress Notes (Signed)
Patient ID: Jason Bean, male   DOB: October 14, 1961, 52 y.o.   MRN: [id number]  He has been discharged home. Was given money for bus. He voiced understanding of discharge instruction and of follow up. He denies thoughts of I and HI. All belonging taken home with him.
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
## 2012-12-07 NOTE — Progress Notes (Signed)
Patient ID: Jason Bean, male   DOB: 09-04-1961, 52 y.o.   MRN: [id number]  D: Patient lying in bed with eyes closed. Respirations even and non-labored. A: Staff will monitor on q 15 minute checks, follow treatment plan, and give meds. R: No response from patient due to sleeping.

## 2012-12-07 NOTE — Clinical Social Work Note (Signed)
[facility] LCSW Group Therapy   Type of Therapy:  Discharge Planning at 8:30   Participation Level:  Minimal  Participation Quality:  Appropriate, Attentive and Sharing  Affect:  Anxious  Cognitive:  Alert  Insight:  Improving  Engagement in Therapy:  Improving  Modes of Intervention:  Education, Exploration and Problem-solving  Summary of Progress/Problems:  Pt denies suicidal and homicidal ideation.  On a scale of 1 to 10 with ten being the most ever experienced, the patient rates depression at a 6 and anxiety at an 8.5. Patient given descriptive sheet describing [place] [facility]. Jason Bean shared with group that he had never ridden a bus and received support and suggestions from others in group.    Clide Dales 12/07/2012 1:05 PM

## 2012-12-07 NOTE — Progress Notes (Signed)
[facility] Group Notes:  (Counselor/Nursing/MHT/Case Management/Adjunct)  Type of Therapy:  Psychoeducational Skills  Participation Level:  Minimal  Participation Quality:  Appropriate and Attentive  Affect:  Blunted  Cognitive:  Appropriate and Oriented  Insight:  Good  Engagement in Group:  Engaged  Modes of Intervention:  Activity, Discussion, Education, Problem-solving, Rapport Building, Socialization and Support  Summary of Progress/Problems: Milik attended psychoeducational group that focused on using quality time with support systems/individuals to engage in health coping skills. Jai participated in activity guessing about self and peers. Leah was quiet but attentive while group discussed who their support systems are, how they can spend positive quality time with them as a coping skills and a way to strengthen their relationship. Ivory state his family is his support system. Coleman was given a homework assignment to find two ways to improve his support systems and twenty activities he can do to spend quality time with his supports.   Wandra Scot 12/07/2012 3:13 PM

## 2012-12-07 NOTE — [facility] Suicide Risk Assessment (Signed)
Suicide Risk Assessment  Discharge Assessment     Demographic Factors:  Caucasian  Mental Status Per Nursing Assessment::   On Admission:  Suicidal ideation indicated by patient  Current Mental Status by Physician: In full contact with reality. There are no suicidal ideas, plans or intent. He is committed to abstinence. Will be going to the shelter in [facility], and from there go to treatment at [facility] and eventually back to having his own place.   Loss Factors: Financial problems/change in socioeconomic status  Historical Factors: NA  Risk Reduction Factors:   Sense of responsibility to family and Positive social support  Continued Clinical Symptoms:  Alcohol/Substance Abuse/Dependencies  Cognitive Features That Contribute To Risk: No evidnce  Suicide Risk:  Minimal: No identifiable suicidal ideation.  Patients presenting with no risk factors but with morbid ruminations; may be classified as minimal risk based on the severity of the depressive symptoms  Discharge Diagnoses:   AXIS I:  Alcohol Dependence, Anxiety Disorder NOS AXIS II:  Deferred AXIS III:   Past Medical History  Diagnosis Date  . COPD (chronic obstructive pulmonary disease)   . Hypertension   . Hyperlipidemia   . Arthritis   . Depression    AXIS IV:  housing problems and other psychosocial or environmental problems AXIS V:  61-70 mild symptoms  Plan Of Care/Follow-up recommendations:  Activity:  As tolerated Diet:  regular  Is patient on multiple antipsychotic therapies at discharge:  No   Has Patient had three or more failed trials of antipsychotic monotherapy by history:  No  Recommended Plan for Multiple Antipsychotic Therapies:N/A  Jason Bean A 12/07/2012, 12:46 PM

## 2012-12-09 NOTE — Progress Notes (Signed)
Patient Discharge Instructions:  After Visit Summary (AVS):   Faxed to:  12/09/12 Psychiatric Admission Assessment Note:   Faxed to:  12/09/12 Discharge Summary Note:   Faxed to:  12/09/12 Suicide Risk Assessment - Discharge Assessment:   Faxed to:  12/09/12 Faxed/Sent to the Next Level Care provider:  12/09/12 Faxed to [facility] @ [phone] Jerelene Redden, 12/09/2012, 3:25 PM

## 2012-12-16 NOTE — Discharge Summary (Signed)
Agree with assessment and plan Abreanna Drawdy A. Kalmen Lollar, M.D. 

## 2013-10-13 NOTE — Telephone Encounter (Signed)
error 

## 2017-06-13 ENCOUNTER — Encounter (HOSPITAL_COMMUNITY): Payer: Self-pay | Admitting: *Deleted

## 2017-06-13 ENCOUNTER — Inpatient Hospital Stay (HOSPITAL_COMMUNITY)
Admission: AD | Admit: 2017-06-13 | Discharge: 2017-06-19 | DRG: 885 | Disposition: A | Discharge status: Home / Self Care | Payer: Medicare Other | Source: Intra-hospital | Attending: Psychiatry | Admitting: Psychiatry

## 2017-06-13 DIAGNOSIS — R110 Nausea: Secondary | ICD-10-CM | POA: Diagnosis not present

## 2017-06-13 DIAGNOSIS — G4700 Insomnia, unspecified: Secondary | ICD-10-CM | POA: Diagnosis present

## 2017-06-13 DIAGNOSIS — R45851 Suicidal ideations: Secondary | ICD-10-CM | POA: Diagnosis present

## 2017-06-13 DIAGNOSIS — F332 Major depressive disorder, recurrent severe without psychotic features: Secondary | ICD-10-CM | POA: Diagnosis present

## 2017-06-13 DIAGNOSIS — F17210 Nicotine dependence, cigarettes, uncomplicated: Secondary | ICD-10-CM | POA: Diagnosis present

## 2017-06-13 DIAGNOSIS — Z590 Homelessness: Secondary | ICD-10-CM

## 2017-06-13 DIAGNOSIS — F419 Anxiety disorder, unspecified: Secondary | ICD-10-CM | POA: Diagnosis present

## 2017-06-13 DIAGNOSIS — Z79899 Other long term (current) drug therapy: Secondary | ICD-10-CM | POA: Diagnosis not present

## 2017-06-13 DIAGNOSIS — E785 Hyperlipidemia, unspecified: Secondary | ICD-10-CM | POA: Diagnosis not present

## 2017-06-13 DIAGNOSIS — Z560 Unemployment, unspecified: Secondary | ICD-10-CM | POA: Diagnosis not present

## 2017-06-13 DIAGNOSIS — J449 Chronic obstructive pulmonary disease, unspecified: Secondary | ICD-10-CM | POA: Diagnosis present

## 2017-06-13 DIAGNOSIS — F10239 Alcohol dependence with withdrawal, unspecified: Secondary | ICD-10-CM | POA: Diagnosis present

## 2017-06-13 DIAGNOSIS — E7800 Pure hypercholesterolemia, unspecified: Secondary | ICD-10-CM | POA: Diagnosis present

## 2017-06-13 DIAGNOSIS — F10129 Alcohol abuse with intoxication, unspecified: Secondary | ICD-10-CM

## 2017-06-13 DIAGNOSIS — I10 Essential (primary) hypertension: Secondary | ICD-10-CM | POA: Diagnosis present

## 2017-06-13 DIAGNOSIS — G8929 Other chronic pain: Secondary | ICD-10-CM | POA: Diagnosis present

## 2017-06-13 DIAGNOSIS — R569 Unspecified convulsions: Secondary | ICD-10-CM | POA: Diagnosis not present

## 2017-06-13 DIAGNOSIS — M1990 Unspecified osteoarthritis, unspecified site: Secondary | ICD-10-CM | POA: Diagnosis present

## 2017-06-13 DIAGNOSIS — M542 Cervicalgia: Secondary | ICD-10-CM | POA: Diagnosis present

## 2017-06-13 DIAGNOSIS — K219 Gastro-esophageal reflux disease without esophagitis: Secondary | ICD-10-CM | POA: Diagnosis present

## 2017-06-13 DIAGNOSIS — R05 Cough: Secondary | ICD-10-CM | POA: Diagnosis not present

## 2017-06-13 DIAGNOSIS — R45850 Homicidal ideations: Secondary | ICD-10-CM | POA: Diagnosis not present

## 2017-06-13 DIAGNOSIS — F1910 Other psychoactive substance abuse, uncomplicated: Secondary | ICD-10-CM | POA: Diagnosis not present

## 2017-06-13 MED ORDER — LORAZEPAM 1 MG PO TABS
1.0000 mg | ORAL_TABLET | Freq: Four times a day (QID) | ORAL | Status: AC
Start: 2017-06-13 — End: 2017-06-15
  Administered 2017-06-13 – 2017-06-15 (×6): 1 mg via ORAL
  Filled 2017-06-13 (×6): qty 1

## 2017-06-13 MED ORDER — ALUM & MAG HYDROXIDE-SIMETH 200-200-20 MG/5ML PO SUSP: 30.0000 mL | ORAL | Status: DC | PRN

## 2017-06-13 MED ORDER — MAGNESIUM HYDROXIDE 400 MG/5ML PO SUSP: 30.0000 mL | Freq: Every day | ORAL | Status: DC | PRN

## 2017-06-13 MED ORDER — THIAMINE HCL 100 MG/ML IJ SOLN
100.0000 mg | Freq: Once | INTRAMUSCULAR | Status: AC
  Administered 2017-06-13: 100 mg via INTRAMUSCULAR
  Filled 2017-06-13: qty 2

## 2017-06-13 MED ORDER — LORAZEPAM 1 MG PO TABS
1.0000 mg | ORAL_TABLET | Freq: Two times a day (BID) | ORAL | Status: AC
  Administered 2017-06-16 – 2017-06-17 (×2): 1 mg via ORAL
  Filled 2017-06-13 (×2): qty 1

## 2017-06-13 MED ORDER — LOPERAMIDE HCL 2 MG PO CAPS
2.0000 mg | ORAL_CAPSULE | ORAL | Status: AC | PRN
  Administered 2017-06-15: 4 mg via ORAL
  Filled 2017-06-13: qty 2

## 2017-06-13 MED ORDER — ONDANSETRON 4 MG PO TBDP
4.0000 mg | ORAL_TABLET | Freq: Four times a day (QID) | ORAL | Status: AC | PRN
  Administered 2017-06-14 – 2017-06-15 (×2): 4 mg via ORAL
  Filled 2017-06-13 (×2): qty 1

## 2017-06-13 MED ORDER — LORAZEPAM 1 MG PO TABS
1.0000 mg | ORAL_TABLET | Freq: Three times a day (TID) | ORAL | Status: AC
  Administered 2017-06-15 – 2017-06-16 (×3): 1 mg via ORAL
  Filled 2017-06-13 (×3): qty 1

## 2017-06-13 MED ORDER — ADULT MULTIVITAMIN W/MINERALS CH
1.0000 | ORAL_TABLET | Freq: Every day | ORAL | Status: DC
  Administered 2017-06-14 – 2017-06-19 (×6): 1 via ORAL
  Filled 2017-06-13 (×9): qty 1

## 2017-06-13 MED ORDER — VITAMIN B-1 100 MG PO TABS
100.0000 mg | ORAL_TABLET | Freq: Every day | ORAL | Status: DC
  Administered 2017-06-14 – 2017-06-19 (×6): 100 mg via ORAL
  Filled 2017-06-13 (×8): qty 1

## 2017-06-13 MED ORDER — ACETAMINOPHEN 325 MG PO TABS
650.0000 mg | ORAL_TABLET | Freq: Four times a day (QID) | ORAL | Status: DC | PRN
  Administered 2017-06-13 – 2017-06-18 (×3): 650 mg via ORAL
  Filled 2017-06-13 (×3): qty 2

## 2017-06-13 MED ORDER — TRAZODONE HCL 50 MG PO TABS
50.0000 mg | ORAL_TABLET | Freq: Every evening | ORAL | Status: DC | PRN
  Administered 2017-06-13: 50 mg via ORAL
  Filled 2017-06-13 (×7): qty 1

## 2017-06-13 MED ORDER — HYDROXYZINE HCL 25 MG PO TABS
25.0000 mg | ORAL_TABLET | Freq: Four times a day (QID) | ORAL | Status: AC | PRN
  Administered 2017-06-13 – 2017-06-15 (×2): 25 mg via ORAL
  Filled 2017-06-13 (×2): qty 1

## 2017-06-13 MED ORDER — LORAZEPAM 1 MG PO TABS
1.0000 mg | ORAL_TABLET | Freq: Every day | ORAL | Status: AC
  Administered 2017-06-18: 1 mg via ORAL
  Filled 2017-06-13: qty 1

## 2017-06-13 MED ORDER — LORAZEPAM 1 MG PO TABS
1.0000 mg | ORAL_TABLET | Freq: Four times a day (QID) | ORAL | Status: AC | PRN
  Administered 2017-06-15 – 2017-06-16 (×3): 1 mg via ORAL
  Filled 2017-06-13 (×3): qty 1

## 2017-06-13 NOTE — Progress Notes (Signed)
D: Pt denies HI/AVH. Patient is passive SI but verbally contracts for safety while on unit. Pt is anxious and adjusting to environment. Patient states his goal is to get help to detox off of alcohol.  A: Pt was offered support and encouragement. Pt was given scheduled medications. Pt was encourage to attend groups. Q 15 minute checks were done for safety.  R:Pt did not attend group and interacts well with staff. Pt is taking medication. Pt has no complaints.Pt receptive to treatment and safety maintained on unit.

## 2017-06-13 NOTE — Progress Notes (Cosign Needed)
Admission Note:  56 year old male who presents, in no acute distress, for the treatment of SI and Substance Abuse. Patient reports SI with a plan to "walk out in front of a truck".  Patient reports daily alcohol use.  Patient appears anxious. Patient was calm and cooperative with admission process. Patient presents with passive SI and contracts for safety upon admission. Patient reports visual hallucinations and states "I see things crawling on the wall". Patient reports alcohol withdrawal symptoms of "shaking, dizziness, lightheadedness, and nausea".  Patient reports that he was staying at a halfway house for 6 months and then got kicked out after relapsing with alcohol.  Patient reports SI is due to patient feeling like he "disappointed everyone and myself".  Patient reports, prior to admission, he was staying in a shelter or "out in the woods".  Patient is unable to identify a support system.  [facility], patient would like to "Lean to stay away from alcohol" and "Find a place to live".  Skin was assessed.  Patient has abrasions on arms and legs bilateral. Patient has what appears to be mosquito bites on legs bilateral.  Patient searched and no contraband found, POC and unit policies explained and understanding verbalized. Consents obtained. Food and fluids offered and accepted. Patient had no additional questions or concerns.

## 2017-06-13 NOTE — BH Assessment (Signed)
Tele Assessment Note   Jason Bean is a 56 y.o. male who [facility]  Pt reports that he has been sober of alcohol for 3.5 years-his longest period of sobriety. He has been living in a rehab house for the last 6 months. Last weekend, he went to the beach and relapsed and he's been drinking daily every since. He was kicked out of the rehab house and is now having thoughts of suicide with a plan to walk in front of a car. Pt expresses hopelessness when discussing his fight for sobriety. Pt reports having 2 or 3 prior suicide attempts by OD and cutting his wrists. He was hospitalized those 2 or 3 times. Pt indicates his last hospitalization was @ 7 years ago.  Diagnosis: MDD, recurrent episode, severe; Alcohol use d/o, severe  Past Medical History:  Past Medical History:  Diagnosis Date  . Arthritis   . COPD (chronic obstructive pulmonary disease)   . Depression   . Hyperlipidemia   . Hypertension     Past Surgical History:  Procedure Laterality Date  . BACK SURGERY    . NECK SURGERY      Family History: No family history on file.  Social History:  reports that he has been smoking Cigarettes.  He has a 30.00 pack-year smoking history. He does not have any smokeless tobacco history on file. He reports that he drinks alcohol. He reports that he does not use drugs.  Additional Social History:  Alcohol / Drug Use Pain Medications: see PTA meds Prescriptions: see PTA meds Over the Counter: see PTA meds History of alcohol / drug use?: Yes Longest period of sobriety (when/how long): 3.5 years ended @ 5 days ago Substance #1 Name of Substance 1: Alcohol 1 - Frequency: daily 1 - Duration: ongoing for @ 5 days 1 - Last Use / Amount: last night  CIWA:   COWS:    PATIENT STRENGTHS: (choose at least two) Average or above average intelligence Capable of independent living Financial means Motivation for treatment/growth Supportive  family/friends  Allergies: No Known Allergies  Home Medications:  (Not in a hospital admission)  OB/GYN Status:  No LMP for male patient.  General Assessment Data [facility] of Assessment: [facility] TTS Assessment: Out of system Is this a Tele or Face-to-Face Assessment?: Tele Assessment Is this an Initial Assessment or a Re-assessment for this encounter?: Initial Assessment Marital status: Single Living Arrangements: Alone, Other (Comment) (homeless, living in hotels) Can pt return to current living arrangement?: Yes Admission Status: Voluntary Is patient capable of signing voluntary admission?: Yes Referral Source: Self/Family/Friend Insurance type: [facility]     Crisis Care Plan Living Arrangements: Alone, Other (Comment) (homeless, living in hotels) Name of Psychiatrist: none Name of Therapist: none  Education Status Is patient currently in school?: No  Risk to self with the past 6 months Suicidal Ideation: Yes-Currently Present Has patient been a risk to self within the past 6 months prior to admission? : No Suicidal Intent: Yes-Currently Present Has patient had any suicidal intent within the past 6 months prior to admission? : No Is patient at risk for suicide?: Yes Suicidal Plan?: Yes-Currently Present Has patient had any suicidal plan within the past 6 months prior to admission? : No Specify Current Suicidal Plan: walk in front of a car Access to Means: Yes Specify Access to Suicidal Means: ability to walk What has been your use of drugs/alcohol within the last 12 months?: see above  Previous Attempts/Gestures: Yes How many times?:  (2 or 3 times) Triggers for Past Attempts: Unknown Intentional Self Injurious Behavior: None Family Suicide History: Unknown Recent stressful life event(s): Other (Comment) (relapse on alcohol) Persecutory voices/beliefs?: No Depression: Yes Depression Symptoms: Feeling worthless/self pity Substance abuse history  and/or treatment for substance abuse?: Yes Suicide prevention information given to non-admitted patients: Not applicable  Risk to Others within the past 6 months Homicidal Ideation: No Does patient have any lifetime risk of violence toward others beyond the six months prior to admission? : No Thoughts of Harm to Others: No Current Homicidal Intent: No Current Homicidal Plan: No Access to Homicidal Means: No History of harm to others?: No Assessment of Violence: None Noted Does patient have access to weapons?: No Criminal Charges Pending?: No Does patient have a court date: No Is patient on probation?: No  Psychosis Hallucinations: None noted Delusions: None noted  Mental Status Report Appearance/Hygiene: Unremarkable Eye Contact: Fair Motor Activity: Unremarkable Speech: Logical/coherent Level of Consciousness: Quiet/awake Mood: Depressed, Pleasant Affect: Appropriate to circumstance Anxiety Level: Minimal Thought Processes: Coherent, Relevant Judgement: Partial Orientation: Place, Person, Time, Situation Obsessive Compulsive Thoughts/Behaviors: None  Cognitive Functioning Concentration: Normal Memory: Recent Intact, Remote Intact IQ: Average Insight: Good Impulse Control: Unable to Assess Appetite: Poor Sleep: Decreased Vegetative Symptoms: None  ADLScreening [facility]) Patient's cognitive ability adequate to safely complete daily activities?: Yes Patient able to express need for assistance with ADLs?: Yes Independently performs ADLs?: Yes (appropriate for developmental age)  Prior Inpatient Therapy Prior Inpatient Therapy: Yes Prior Therapy Dates: 2014 Prior Therapy Facilty/Provider(s): [facility] Reason for Treatment: depression  Prior Outpatient Therapy Prior Outpatient Therapy: Yes Does patient have an ACCT team?: No Does patient have Intensive In-House Services?  : No Does patient have [facility] services? : No Does patient have P4CC  services?: No  ADL Screening (condition at time of admission) Patient's cognitive ability adequate to safely complete daily activities?: Yes Is the patient deaf or have difficulty hearing?: No Does the patient have difficulty seeing, even when wearing glasses/contacts?: No Does the patient have difficulty concentrating, remembering, or making decisions?: No Patient able to express need for assistance with ADLs?: Yes Does the patient have difficulty dressing or bathing?: No Independently performs ADLs?: Yes (appropriate for developmental age) Does the patient have difficulty walking or climbing stairs?: No Weakness of Legs: None Weakness of Arms/Hands: None  Home Assistive Devices/Equipment Home Assistive Devices/Equipment: None    Abuse/Neglect Assessment (Assessment to be complete while patient is alone) Physical Abuse: Denies Verbal Abuse: Denies Sexual Abuse: Denies Exploitation of patient/patient's resources: Denies Self-Neglect: Denies Values / Beliefs Cultural Requests During Hospitalization: None Spiritual Requests During Hospitalization: None Consults Spiritual Care Consult Needed: No Social Work Consult Needed: No Merchant navy officer (For Healthcare) Does Patient Have a Medical Advance Directive?: No Would patient like information on creating a medical advance directive?: No - Patient declined    Additional Information 1:1 In Past 12 Months?: No CIRT Risk: No Elopement Risk: No Does patient have medical clearance?: Yes     Disposition:  Disposition Initial Assessment Completed for this Encounter: Yes (consulted with De Burrs, NP) Disposition of Patient: Inpatient treatment program Type of inpatient treatment program: Adult (PT accepted to 307-2.)  Laddie Aquas 06/13/2017 2:56 PM

## 2017-06-13 NOTE — Tx Team (Signed)
Initial Treatment Plan 06/13/2017 9:11 PM Beryle QuantJames W Rozario [id number]    PATIENT STRESSORS: Substance abuse   PATIENT STRENGTHS: Ability for insight Communication skills Motivation for treatment/growth   PATIENT IDENTIFIED PROBLEMS: Substance Abuse  At risk for suicide  "Learn to stay away from alcohol"  "Find a place to live"               DISCHARGE CRITERIA:  Adequate post-discharge living arrangements Improved stabilization in mood, thinking, and/or behavior Motivation to continue treatment in a less acute level of care Need for constant or close observation no longer present Withdrawal symptoms are absent or subacute and managed without 24-hour nursing intervention  PRELIMINARY DISCHARGE PLAN: Attend 12-step recovery group Outpatient therapy Placement in alternative living arrangements  PATIENT/FAMILY INVOLVEMENT: This treatment plan has been presented to and reviewed with the patient, Beryle QuantJames W Vecchione.  The patient and family have been given the opportunity to ask questions and make suggestions.  Carleene OverlieMiddleton, Royalty Fakhouri P, RN 06/13/2017, 9:11 PM

## 2017-06-13 NOTE — Progress Notes (Signed)
Patient did not attend the evening speaker AA meeting. Pt was notified that group was beginning but remained in bed.   

## 2017-06-14 DIAGNOSIS — R45851 Suicidal ideations: Secondary | ICD-10-CM

## 2017-06-14 DIAGNOSIS — R569 Unspecified convulsions: Secondary | ICD-10-CM

## 2017-06-14 LAB — LIPID PANEL
CHOL/HDL RATIO: 2.3 ratio
Cholesterol: 144 mg/dL (ref 0–200)
HDL: 62 mg/dL (ref >40)
LDL CALC: 47 mg/dL (ref 0–99)
TRIGLYCERIDES: 174 mg/dL — AB (ref <150)
VLDL: 35 mg/dL (ref 0–40)

## 2017-06-14 LAB — TSH: TSH: 0.974 u[IU]/mL (ref 0.350–4.500)

## 2017-06-14 MED ORDER — FLUOXETINE HCL 20 MG PO CAPS
20.0000 mg | ORAL_CAPSULE | Freq: Every day | ORAL | Status: DC
  Administered 2017-06-14 – 2017-06-15 (×2): 20 mg via ORAL
  Filled 2017-06-14 (×4): qty 1

## 2017-06-14 MED ORDER — OXCARBAZEPINE 300 MG PO TABS
600.0000 mg | ORAL_TABLET | Freq: Two times a day (BID) | ORAL | Status: DC
  Administered 2017-06-14 – 2017-06-19 (×11): 600 mg via ORAL
  Filled 2017-06-14 (×13): qty 2

## 2017-06-14 MED ORDER — ALBUTEROL SULFATE HFA 108 (90 BASE) MCG/ACT IN AERS
2.0000 | INHALATION_SPRAY | Freq: Four times a day (QID) | RESPIRATORY_TRACT | Status: DC | PRN
  Administered 2017-06-14 – 2017-06-15 (×2): 2 via RESPIRATORY_TRACT
  Filled 2017-06-14: qty 6.7

## 2017-06-14 MED ORDER — TRAZODONE HCL 50 MG PO TABS
50.0000 mg | ORAL_TABLET | Freq: Every day | ORAL | Status: DC
  Administered 2017-06-14 – 2017-06-18 (×5): 50 mg via ORAL
  Filled 2017-06-14 (×6): qty 1

## 2017-06-14 NOTE — [facility] Counselor (Signed)
CSW made two separate attempts today to meet with patient to complete PSA> First attempt was before lunch; second attempt between 3PM and 3:13 PM. Patient observed sleeping in bed with deep sleep sounds; pt unresponsive to lights being turned on and name called loudly. MHT reports patient only up once for meds today, has not attended groups of meals in cafeteria.   Carney Bernatherine C Oluwaseyi Raffel, LCSW

## 2017-06-14 NOTE — H&P (Signed)
Psychiatric Admission Assessment Adult  Patient Identification: Jason Bean MRN:  [id number] Date of Evaluation:  06/14/2017 Chief Complaint:  MDD,rec,sev Alcohol Use disorder,sev Principal Diagnosis: <principal problem not specified> Diagnosis:   Patient Active Problem List   Diagnosis Date Noted  . Severe recurrent major depression without psychotic features (HCC) [F33.2] 06/13/2017  . Alcohol dependence (HCC) [F10.20] 11/27/2012  . GERD (gastroesophageal reflux disease) [K21.9] 02/12/2012  . Hypertension [I10] 01/06/2012  . COPD (chronic obstructive pulmonary disease) (HCC) [J44.9] 01/06/2012  . Hyperlipidemia [E78.5] 01/06/2012  . Anxiety [F41.9] 01/06/2012  . Neck pain, chronic [M54.2, G89.29] 01/06/2012   History of Present Illness:  56 yo Caucasian male, single, lives in a sober home, on SSI. Background history of Alcohol Use Disorder and MDD. Transferred from [facility] ER. Patient was taken there by his friends. He was withdrawing from alcohol. He expressed hopelessness and suicidal thoughts.  At interview while sober, patient states that he had been sober for over three years. Says he had been doing fine until a week ago. Says he was influenced by a male friend and relapsed. He has been drinking daily since then. Drinks a lot of beer. No other substance use. Says [facility] has zero tolerance for alcohol and drugs. He has been kicked out of there. Says he felt hopeless as he has lost everything " my friends there, my housing ,,, my volunteer job" . Feels disappointed in himself. Reports worsening depression since he started drinking again. His sleep wake cycle has been altered. He is waking early in the morning, he is not eating well. He was seeing stuff crawling on the wall yesterday. No associated auditory or tactile hallucinations. No associated feeling of persecution.  Says he felt there was no point going on like this. He had thoughts of suicide but did not have any  specific plans. Patient says he wants to get better and stay sober. No associated thoughts of violence. No associated homicidal thoughts. No assess to weapons.  Total Time spent with patient: 1 hour  Past Psychiatric History:  Patient has been treated for depression with Prozac. He takes Amitriptyline for sleep. He is on Trileptal as a mood stabilizer and for treatment of seizures.  Patient has had multiple inpatient admissions over the years. Says it is usually related to suicidal thoughts/ behavior while under the influence of substances. He had overdosed in the past. He has cut his wrist a couple of times in the past. Has transient psychosis while coming off alcohol. No past history of mania. No past history of violent behavior.   Is the patient at risk to self? Yes.    Has the patient been a risk to self in the past 6 months? Yes.    Has the patient been a risk to self within the distant past? Yes.    Is the patient a risk to others? No.  Has the patient been a risk to others in the past 6 months? No.  Has the patient been a risk to others within the distant past? No.   Prior Inpatient Therapy: Prior Inpatient Therapy: Yes Prior Therapy Dates: 2014 Prior Therapy Facilty/Provider(s): [facility] Reason for Treatment: depression Prior Outpatient Therapy: Prior Outpatient Therapy: Yes Does patient have an ACCT team?: No Does patient have Intensive In-House Services?  : No Does patient have [facility] services? : No Does patient have P4CC services?: No  Alcohol Screening: 1. How often do you have a drink containing alcohol?: 4 or more times a  week 2. How many drinks containing alcohol do you have on a typical day when you are drinking?: 10 or more 3. How often do you have six or more drinks on one occasion?: Daily or almost daily Preliminary Score: 8 4. How often during the last year have you found that you were not able to stop drinking once you had started?: Monthly 5. How often during the  last year have you failed to do what was normally expected from you becasue of drinking?: Never 6. How often during the last year have you needed a first drink in the morning to get yourself going after a heavy drinking session?: Daily or almost daily 7. How often during the last year have you had a feeling of guilt of remorse after drinking?: Daily or almost daily 8. How often during the last year have you been unable to remember what happened the night before because you had been drinking?: Daily or almost daily 9. Have you or someone else been injured as a result of your drinking?: No 10. Has a relative or friend or a doctor or another health worker been concerned about your drinking or suggested you cut down?: Yes, during the last year Alcohol Use Disorder Identification Test Final Score (AUDIT): 30 Brief Intervention: Yes Substance Abuse History in the last 12 months:  Yes.   Consequences of Substance Abuse: Limited contact with his children and grand children. Has now lost his housing and circle of friends. 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  Previous Psychotropic Medications: Yes  Psychological Evaluations: Yes  Past Medical History:  Past Medical History:  Diagnosis Date  . Arthritis   . COPD (chronic obstructive pulmonary disease) (HCC)   . Depression   . Hyperlipidemia   . Hypertension     Past Surgical History:  Procedure Laterality Date  . BACK SURGERY    . NECK SURGERY     Family History: History reviewed. No pertinent family history. Family Psychiatric  History: Family history of addiction. No family history of suicide. No family history of mental illness.  Tobacco Screening: Have you used any form of tobacco in the last 30 days? (Cigarettes, Smokeless Tobacco, Cigars, and/or Pipes): Yes Tobacco use, Select all that apply: 5 or more cigarettes per day Are you interested in Tobacco Cessation Medications?: No, patient refused Counseled patient on smoking cessation  including recognizing danger situations, developing coping skills and basic information about quitting provided: Refused/Declined practical counseling Social History:  History  Alcohol Use  . Yes    Comment: Pt drinks daily . unknown amount     History  Drug Use No    Additional Social History: Marital status: Single    Pain Medications: see PTA meds Prescriptions: see PTA meds Over the Counter: see PTA meds History of alcohol / drug use?: Yes Longest period of sobriety (when/how long): 3.5 years ended @ 5 days ago Name of Substance 1: Alcohol 1 - Frequency: daily 1 - Duration: ongoing for @ 5 days 1 - Last Use / Amount: last night       Not in any relationship. Never married. Has two children and four grand kids. Addiction has limited engagement with his family. No pending legal issues.  Allergies:  No Known Allergies Lab Results:  Results for orders placed or performed during the hospital encounter of 06/13/17 (from the past 48 hour(s))  Lipid panel     Status: Abnormal   Collection Time: 06/14/17  6:20 AM  Result Value Ref  Range   Cholesterol 144 0 - 200 mg/dL   Triglycerides 621174 (H) <150 mg/dL   HDL 62 >30>40 mg/dL   Total CHOL/HDL Ratio 2.3 RATIO   VLDL 35 0 - 40 mg/dL   LDL Cholesterol 47 0 - 99 mg/dL    Comment:        Total Cholesterol/HDL:CHD Risk Coronary Heart Disease Risk Table                     Men   Women  1/2 Average Risk   3.4   3.3  Average Risk       5.0   4.4  2 X Average Risk   9.6   7.1  3 X Average Risk  23.4   11.0        Use the calculated Patient Ratio above and the CHD Risk Table to determine the patient's CHD Risk.        ATP III CLASSIFICATION (LDL):  <100     mg/dL   Optimal  [id number]  mg/dL   Near or Above                    Optimal  130-159  mg/dL   Borderline  [id number]  mg/dL   High  >284>190     mg/dL   Very High Performed at [facility], [address] [place], [place] [address]   TSH     Status: None   Collection Time:  06/14/17  6:20 AM  Result Value Ref Range   TSH 0.974 0.350 - 4.500 uIU/mL    Comment: Performed by a 3rd Generation assay with a functional sensitivity of <=0.01 uIU/mL. Performed at [facility], [address] [place], [place] [address]     Blood Alcohol level:  Lab Results  Component Value Date   ETH 316 (H) 11/26/2012   ETH 212 (H) 03/09/2012    Metabolic Disorder Labs:  No results found for: HGBA1C, MPG No results found for: PROLACTIN Lab Results  Component Value Date   CHOL 144 06/14/2017   TRIG 174 (H) 06/14/2017   HDL 62 06/14/2017   CHOLHDL 2.3 06/14/2017   VLDL 35 06/14/2017   LDLCALC 47 06/14/2017    Current Medications: Current Facility-Administered Medications  Medication Dose Route Frequency Provider Last Rate Last Dose  . acetaminophen (TYLENOL) tablet 650 mg  650 mg Oral Q6H PRN Jackelyn PolingBerry, Jason A, NP   650 mg at 06/13/17 2044  . alum & mag hydroxide-simeth (MAALOX/MYLANTA) 200-200-20 MG/5ML suspension 30 mL  30 mL Oral Q4H PRN Nira ConnBerry, Jason A, NP      . hydrOXYzine (ATARAX/VISTARIL) tablet 25 mg  25 mg Oral Q6H PRN Nira ConnBerry, Jason A, NP   25 mg at 06/13/17 2045  . loperamide (IMODIUM) capsule 2-4 mg  2-4 mg Oral PRN Nira ConnBerry, Jason A, NP      . LORazepam (ATIVAN) tablet 1 mg  1 mg Oral Q6H PRN Nira ConnBerry, Jason A, NP      . LORazepam (ATIVAN) tablet 1 mg  1 mg Oral QID Nira ConnBerry, Jason A, NP   1 mg at 06/14/17 0810   Followed by  . [START ON 06/15/2017] LORazepam (ATIVAN) tablet 1 mg  1 mg Oral TID Jackelyn PolingBerry, Jason A, NP       Followed by  . [START ON 06/16/2017] LORazepam (ATIVAN) tablet 1 mg  1 mg Oral BID Jackelyn PolingBerry, Jason A, NP       Followed  by  . Melene Muller ON 06/18/2017] LORazepam (ATIVAN) tablet 1 mg  1 mg Oral Daily Nira Conn A, NP      . magnesium hydroxide (MILK OF MAGNESIA) suspension 30 mL  30 mL Oral Daily PRN Nira Conn A, NP      . multivitamin with minerals tablet 1 tablet  1 tablet Oral Daily Nira Conn A, NP   1 tablet at 06/14/17 0809  .  ondansetron (ZOFRAN-ODT) disintegrating tablet 4 mg  4 mg Oral Q6H PRN Nira Conn A, NP   4 mg at 06/14/17 0631  . Oxcarbazepine (TRILEPTAL) tablet 600 mg  600 mg Oral BID [name], [name], MD      . thiamine (VITAMIN B-1) tablet 100 mg  100 mg Oral Daily Nira Conn A, NP   100 mg at 06/14/17 0810  . traZODone (DESYREL) tablet 50 mg  50 mg Oral QHS,MR X 1 Nira Conn A, NP   50 mg at 06/13/17 2220   PTA Medications: Prescriptions Prior to Admission  Medication Sig Dispense Refill Last Dose  . albuterol (PROVENTIL HFA;VENTOLIN HFA) 108 (90 BASE) MCG/ACT inhaler Inhale 2 puffs into the lungs every 6 (six) hours as needed. :For shortness of breath.     Marland Kitchen FLUoxetine (PROZAC) 20 MG capsule Take 1 capsule (20 mg total) by mouth daily. :For depression 30 capsule 0   . haloperidol (HALDOL) 5 MG tablet Take 1 tablet (5 mg total) by mouth 2 (two) times daily. For mood control 60 tablet 0   . mometasone-formoterol (DULERA) 200-5 MCG/ACT AERO Inhale 2 puffs into the lungs 2 (two) times daily. For wheezing/shortness of breath. 1 Inhaler 3   . oxcarbazepine (TRILEPTAL) 600 MG tablet Take 1 tablet (600 mg total) by mouth 2 (two) times daily. :For mood stabilization 60 tablet 0   . traZODone (DESYREL) 150 MG tablet Take 1 tablet (150 mg total) by mouth at bedtime. For sleep 30 tablet 0     Musculoskeletal: Strength & Muscle Tone: within normal limits Gait & Station: broad based Patient leans: N/A  Psychiatric Specialty Exam: Physical Exam  Constitutional: No distress.  HENT:  Head: Normocephalic.  Neck: Normal range of motion.  Respiratory: Effort normal.  Neurological: He is alert.  Skin: He is not diaphoretic.  Psychiatric:  As above    ROS  Blood pressure 129/78, pulse 82, temperature 97.8 F (36.6 C), temperature source Oral, resp. rate 18, height 5\' 9"  (1.753 m), weight 93 kg (205 lb), SpO2 95 %.Body mass index is 30.27 kg/m.  General Appearance: Poor grooming, in hospital clothing,  mild tremors.  not sweaty, not confused. Normal conjugate eye movements. Not internally distressed. Appropriate behavior.   Eye Contact:  Good  Speech:  Garbled  Volume:  Normal  Mood:  Anxious and feels down  Affect:  Restricted  Thought Process:  Linear  Orientation:  Full (Time, Place, and Person)  Thought Content:  Rumination  Suicidal Thoughts:  Yes.  without intent/plan  Homicidal Thoughts:  No  Memory:  Did not assess  Judgement:  Fair  Insight:  Good  Psychomotor Activity:  Decreased  Concentration:  Fair  Recall:  Did not assess  Fund of Knowledge:  Fair  Language:  Good  Akathisia:  NA  Handed:    AIMS (if indicated):     Assets:  Communication Skills Desire for Improvement Social Support  ADL's:  Impaired  Cognition:  WNL  Sleep:  Number of Hours: 5.75    Treatment Plan Summary: Patient  is coming off alcohol. He is still tremulous and unsteady. Depression and suicidal thoughts are likely related to effects of psychoactive substance. We have agreed to increase Prozac from 20 mg to 40 mg daily. We have agreed to discontinue Amitriptyline as this is potentially fatal with overdose. He slept well on Trazodone last night and plans to continue with Trazodone. He seems motivated to get back into sober living. Needs inpatient detox as he has had seizures in the past and does not have much support in the community.   Psychiatric: MDD recurrent AUD  Medical: Seizure disorder HTN HLD GERD Chronic pain COPD  Psychosocial:  Limited support Homelessness  PLAN: 1. Alcohol withdrawal protocol 2. Prozac 40 mg daily 3  Trazodone 50 mg HS 4. Continue medical medications at home dose.  5. Encourage unit groups and activities 6. Monitor mood, behavior and interaction with peers 7. Motivational enhancement  8. SW would coordinate disposition and aftercare   Observation Level/Precautions:  Detox 15 minute checks  Laboratory:    Psychotherapy:    Medications:     Consultations:    Discharge Concerns:    Estimated LOS: 5-7 days  Other:     Physician Treatment Plan for Primary Diagnosis: <principal problem not specified> Long Term Goal(s): Improvement in symptoms so as ready for discharge  Short Term Goals: Ability to identify changes in lifestyle to reduce recurrence of condition will improve, Ability to verbalize feelings will improve, Ability to disclose and discuss suicidal ideas, Ability to demonstrate self-control will improve, Ability to identify and develop effective coping behaviors will improve, Ability to maintain clinical measurements within normal limits will improve, Compliance with prescribed medications will improve and Ability to identify triggers associated with substance abuse/mental health issues will improve  Physician Treatment Plan for Secondary Diagnosis: Active Problems:   Severe recurrent major depression without psychotic features (HCC)  Long Term Goal(s): Improvement in symptoms so as ready for discharge  Short Term Goals: Ability to identify changes in lifestyle to reduce recurrence of condition will improve, Ability to verbalize feelings will improve, Ability to disclose and discuss suicidal ideas, Ability to demonstrate self-control will improve, Ability to identify and develop effective coping behaviors will improve, Ability to maintain clinical measurements within normal limits will improve, Compliance with prescribed medications will improve and Ability to identify triggers associated with substance abuse/mental health issues will improve  I certify that inpatient services furnished can reasonably be expected to improve the patient's condition.    Georgiann Cocker, MD 7/22/201811:29 AM

## 2017-06-14 NOTE — Progress Notes (Signed)
Patient did not attend the evening speaker AA meeting. Pt was notified that group was beginning but remained in bed.   

## 2017-06-14 NOTE — [facility] Group Notes (Signed)
[facility] LCSW Group Therapy  06/14/2017 11:15 AM - 12:05 PM  Type of Therapy:  Group Therapy  Participation Level:  Did Not Attend; invited to participate yet did not despite overhead announcement and encouragement by staff  Summary of Progress/Problems: Topic for today was thoughts and feelings regarding discharge. We discussed fears of upcoming changes including judgements, expectations and stigma of mental health issues. We then discussed supports: what constitutes a supportive framework, identification of supports and what to do when others are not supportive. Patients then identified a specific coping tool to use when others are not available.     Carney Bernatherine C Harrill, LCSW

## 2017-06-14 NOTE — [facility] Suicide Risk Assessment (Signed)
[facility]   Nursing information obtained from:  Patient Demographic factors:  Male, Caucasian, Low socioeconomic status, Living alone, Unemployed Current Mental Status:  Suicidal ideation indicated by patient, Suicide plan, Plan includes specific time, place, or method, Self-harm thoughts Loss Factors:  Decline in physical health Historical Factors:  Prior suicide attempts, Family history of suicide, Family history of mental illness or substance abuse Risk Reduction Factors:  NA  Total Time spent with patient: 45 minutes Principal Problem: MDD                                  Alcohol withdrawal Diagnosis:   Patient Active Problem List   Diagnosis Date Noted  . Severe recurrent major depression without psychotic features (HCC) [F33.2] 06/13/2017  . Alcohol dependence (HCC) [F10.20] 11/27/2012  . GERD (gastroesophageal reflux disease) [K21.9] 02/12/2012  . Hypertension [I10] 01/06/2012  . COPD (chronic obstructive pulmonary disease) (HCC) [J44.9] 01/06/2012  . Hyperlipidemia [E78.5] 01/06/2012  . Anxiety [F41.9] 01/06/2012  . Neck pain, chronic [M54.2, G89.29] 01/06/2012   Subjective Data:  56 yo Caucasian male, single, lives in a sober home, on SSI. Background history of Alcohol Use Disorder and MDD. [facility]. Patient was taken there by his friends. He was withdrawing from alcohol. He expressed hopelessness and suicidal thoughts. Past history of suicidal behavior while under the influence of substances. Multiple losses from recent relapse. Limited support in the community. Transient psychosis but no command to hurt self or others. No current psychosis. No evidence of mania. No access to weapons. Motivated towards getting treatment and stabilization of his mental state.   Continued Clinical Symptoms:  Alcohol Use Disorder Identification Test Final Score (AUDIT): 30 The "Alcohol Use Disorders Identification Test", Guidelines for Use in  Primary Care, Second Edition.  World Science writerHealth Organization [facility](WHO). Score between 0-7:  no or low risk or alcohol related problems. Score between 8-15:  moderate risk of alcohol related problems. Score between 16-19:  high risk of alcohol related problems. Score 20 or above:  warrants further diagnostic evaluation for alcohol dependence and treatment.   CLINICAL FACTORS:  Depression Alcohol use disorder   Musculoskeletal: Strength & Muscle Tone: within normal limits Gait & Station: broad based Patient leans: N/A  Psychiatric Specialty Exam: Physical Exam As in H&P  ROS  Blood pressure 129/78, pulse 82, temperature 97.8 F (36.6 C), temperature source Oral, resp. rate 18, height 5\' 9"  (1.753 m), weight 93 kg (205 lb), SpO2 95 %.Body mass index is 30.27 kg/m.  General Appearance: As in H&P  Eye Contact:  As in H&P  Speech:  As in H&P  Volume:  As in H&P  Mood:  As in H&P  Affect:  As in H&P  Thought Process:  As in H&P  Orientation:  As in H&P  Thought Content:  As in H&P  Suicidal Thoughts:  As in H&P  Homicidal Thoughts:  As in H&P  Memory:  As in H&P  Judgement:  As in H&P  Insight:  As in H&P  Psychomotor Activity:  As in H&P  Concentration:  As in H&P  Recall:  As in H&P  Fund of Knowledge:  As in H&P  Language:  As in H&P  Akathisia:  As in H&P  Handed:  As in H&P   AIMS (if indicated):     Assets:  As in H&P  ADL's:  As in H&P  Cognition:  As in H&P  Sleep:  Number of Hours: 5.75      COGNITIVE FEATURES THAT CONTRIBUTE TO RISK:  Thought constriction (tunnel vision)    SUICIDE RISK:   Moderate:  Frequent suicidal ideation with limited intensity, and duration, some specificity in terms of plans, no associated intent, good self-control, limited dysphoria/symptomatology, some risk factors present, and identifiable protective factors, including available and accessible social support.  PLAN OF CARE:  As in H&P  I certify that inpatient services furnished can  reasonably be expected to improve the patient's condition.   Georgiann Cocker, MD 06/14/2017, 12:13 PM

## 2017-06-14 NOTE — Progress Notes (Signed)
Patient ID: Jason Bean, male   DOB: 09-28-1961, 56 y.o.   MRN: [id number]  D: Patient denies SI/HI and auditory and visual hallucinations. Patient has a depressed mood and affect. Patient had CIWA of 8 this AM. Shaky, has upset stomach, is extremely anxious. Patient staying in bed most of the time.  A: Patient given emotional support from RN. Patient given medications per MD orders. Patient encouraged to attend groups and unit activities. Patient encouraged to come to staff with any questions or concerns.  R: Patient remains cooperative and appropriate. Will continue to monitor patient for safety.

## 2017-06-15 MED ORDER — AZITHROMYCIN 250 MG PO TABS
250.0000 mg | ORAL_TABLET | Freq: Every day | ORAL | Status: DC
  Administered 2017-06-15 – 2017-06-19 (×5): 250 mg via ORAL
  Filled 2017-06-15 (×6): qty 1

## 2017-06-15 MED ORDER — FLUOXETINE HCL 20 MG PO CAPS
40.0000 mg | ORAL_CAPSULE | Freq: Every day | ORAL | Status: DC
Start: 2017-06-16 — End: 2017-06-19
  Administered 2017-06-16 – 2017-06-19 (×4): 40 mg via ORAL
  Filled 2017-06-15 (×5): qty 2

## 2017-06-15 NOTE — Progress Notes (Signed)
Nursing Note: 0700-1900  D:  Pt presents with depressed mood and anxious affect.  Verbalizes concern over medication that he did not complete for [facility] in the past.  Pt initially stated that he [facility] taking [facility] and then shared that it was over a month ago that he had filled but took few doses.  Called both Outpatient ([facility] and [facility])  no antibiotic order was received or dispensed.  MD made aware.  Pt has an intermitttent wet cough, no wheezing heard and breath sounds are CTA with bilaterally diminished bases upon auscultation. Temp 99.0 oral. Pt up and ambulating in unit, c/o of anxiety and other withdrawal symptoms, remains on Ativan protocol- CIWA range 7-14.  A:  Encouraged to verbalize needs and concerns, active listening and support provided.  Continued Q 15 minute safety checks.    R:  Pt. denies A/V hallucinations and is able to verbally contract for safety.

## 2017-06-15 NOTE — Progress Notes (Addendum)
[facility]  06/15/2017 2:04 PM Jason QuantJames W Bean  MRN:  [id number] Subjective:   56 yo Caucasian male, single, lives in a sober home, on SSI. Background history of Alcohol Use Disorder and MDD. [facility]. Patient was taken there by his friends. He was withdrawing from alcohol. He expressed hopelessness and suicidal thoughts.   Chart reviewed today. Patient discussed at team today  Staff reports that he has been isolating self in his room. He has not participated at unit groups. He is still scoring high on CIWA. No behavioral issues. Has not been observed to be internally stimulated.   Seen today. Still has some tremors. Says he had a period of confusion earlier today. He thoughts he was in a hurry to be at work. Reports transient noises. Has not had any visual hallucination. No suicidal thoughts. No thoughts of violence.  No retching or vomiting. Patient is worried about his aftercare. He does not want to end up in a motel. He hopes to get into sober housing when stable. Patient reports that he has been on Azithromycin for LRI. He wants to recommence same here.   Principal Problem: MDD Recurrent Diagnosis:   Patient Active Problem List   Diagnosis Date Noted  . Severe recurrent major depression without psychotic features (HCC) [F33.2] 06/13/2017  . Alcohol dependence (HCC) [F10.20] 11/27/2012  . GERD (gastroesophageal reflux disease) [K21.9] 02/12/2012  . Hypertension [I10] 01/06/2012  . COPD (chronic obstructive pulmonary disease) (HCC) [J44.9] 01/06/2012  . Hyperlipidemia [E78.5] 01/06/2012  . Anxiety [F41.9] 01/06/2012  . Neck pain, chronic [M54.2, G89.29] 01/06/2012   Total Time spent with patient: 20 minutes  Past Psychiatric History: As in H&P  Past Medical History:  Past Medical History:  Diagnosis Date  . Arthritis   . COPD (chronic obstructive pulmonary disease) (HCC)   . Depression   . Hyperlipidemia   . Hypertension     Past Surgical  History:  Procedure Laterality Date  . BACK SURGERY    . NECK SURGERY     Family History: History reviewed. No pertinent family history. Family Psychiatric  History: As in H&P Social History:  History  Alcohol Use  . Yes    Comment: Pt drinks daily . unknown amount     History  Drug Use No    Social History   Social History  . Marital status: Single    Spouse name: N/A  . Number of children: N/A  . Years of education: N/A   Social History Main Topics  . Smoking status: Current Every Day Smoker    Packs/day: 1.00    Years: 30.00    Types: Cigarettes  . Smokeless tobacco: Never Used  . Alcohol use Yes     Comment: Pt drinks daily . unknown amount  . Drug use: No  . Sexual activity: Not Currently   Other Topics Concern  . 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  Has daughter   Additional Social History:    Pain Medications: see PTA meds Prescriptions: see PTA meds Over the Counter: see PTA meds History of alcohol / drug use?: Yes Longest period of sobriety (when/how long): 3.5 years ended @ 5 days ago Name of Substance 1: Alcohol 1 - Frequency: daily 1 - Duration: ongoing for @ 5 days 1 - Last Use / Amount: last night     Sleep: Good  Appetite:  Good  Current Medications: Current Facility-Administered  Medications  Medication Dose Route Frequency Provider Last Rate Last Dose  . acetaminophen (TYLENOL) tablet 650 mg  650 mg Oral Q6H PRN Jackelyn Poling, NP   650 mg at 06/13/17 2044  . albuterol (PROVENTIL HFA;VENTOLIN HFA) 108 (90 Base) MCG/ACT inhaler 2 puff  2 puff Inhalation Q6H PRN Nira Conn A, NP   2 puff at 06/15/17 0325  . alum & mag hydroxide-simeth (MAALOX/MYLANTA) 200-200-20 MG/5ML suspension 30 mL  30 mL Oral Q4H PRN Nira Conn A, NP      . FLUoxetine (PROZAC) capsule 20 mg  20 mg Oral Daily [name], Delight Ovens, MD   20 mg at 06/15/17 0804  . hydrOXYzine (ATARAX/VISTARIL) tablet 25 mg  25 mg  Oral Q6H PRN Nira Conn A, NP   25 mg at 06/13/17 2045  . loperamide (IMODIUM) capsule 2-4 mg  2-4 mg Oral PRN Nira Conn A, NP      . LORazepam (ATIVAN) tablet 1 mg  1 mg Oral Q6H PRN Nira Conn A, NP   1 mg at 06/15/17 0606  . LORazepam (ATIVAN) tablet 1 mg  1 mg Oral TID Nira Conn A, NP   1 mg at 06/15/17 1141   Followed by  . [START ON 06/16/2017] LORazepam (ATIVAN) tablet 1 mg  1 mg Oral BID Jackelyn Poling, NP       Followed by  . [START ON 06/18/2017] LORazepam (ATIVAN) tablet 1 mg  1 mg Oral Daily Nira Conn A, NP      . magnesium hydroxide (MILK OF MAGNESIA) suspension 30 mL  30 mL Oral Daily PRN Nira Conn A, NP      . multivitamin with minerals tablet 1 tablet  1 tablet Oral Daily Nira Conn A, NP   1 tablet at 06/15/17 0804  . ondansetron (ZOFRAN-ODT) disintegrating tablet 4 mg  4 mg Oral Q6H PRN Nira Conn A, NP   4 mg at 06/14/17 0631  . Oxcarbazepine (TRILEPTAL) tablet 600 mg  600 mg Oral BID Georgiann Cocker, MD   600 mg at 06/15/17 0804  . thiamine (VITAMIN B-1) tablet 100 mg  100 mg Oral Daily Nira Conn A, NP   100 mg at 06/15/17 0804  . traZODone (DESYREL) tablet 50 mg  50 mg Oral QHS [name], Delight Ovens, MD   50 mg at 06/14/17 2113    Lab Results:  Results for orders placed or performed during the hospital encounter of 06/13/17 (from the past 48 hour(s))  Lipid panel     Status: Abnormal   Collection Time: 06/14/17  6:20 AM  Result Value Ref Range   Cholesterol 144 0 - 200 mg/dL   Triglycerides 161 (H) <150 mg/dL   HDL 62 >09 mg/dL   Total CHOL/HDL Ratio 2.3 RATIO   VLDL 35 0 - 40 mg/dL   LDL Cholesterol 47 0 - 99 mg/dL    Comment:        Total Cholesterol/HDL:CHD Risk Coronary Heart Disease Risk Table                     Men   Women  1/2 Average Risk   3.4   3.3  Average Risk       5.0   4.4  2 X Average Risk   9.6   7.1  3 X Average Risk  23.4   11.0        Use the calculated Patient Ratio above and the CHD Risk Table to determine  the  patient's CHD Risk.        ATP III CLASSIFICATION (LDL):  <100     mg/dL   Optimal  409-811  mg/dL   Near or Above                    Optimal  130-159  mg/dL   Borderline  914-782  mg/dL   High  >956     mg/dL   Very High Performed at [facility], [address] [place], [place] [address]   TSH     Status: None   Collection Time: 06/14/17  6:20 AM  Result Value Ref Range   TSH 0.974 0.350 - 4.500 uIU/mL    Comment: Performed by a 3rd Generation assay with a functional sensitivity of <=0.01 uIU/mL. Performed at [facility], [address], [place], [place] [address]     Blood Alcohol level:  Lab Results  Component Value Date   ETH 316 (H) 11/26/2012   ETH 212 (H) 03/09/2012    Metabolic Disorder Labs: No results found for: HGBA1C, MPG No results found for: PROLACTIN Lab Results  Component Value Date   CHOL 144 06/14/2017   TRIG 174 (H) 06/14/2017   HDL 62 06/14/2017   CHOLHDL 2.3 06/14/2017   VLDL 35 06/14/2017   LDLCALC 47 06/14/2017    Physical Findings: AIMS: Facial and Oral Movements Muscles of Facial Expression: None, normal Lips and Perioral Area: None, normal Jaw: None, normal Tongue: None, normal,Extremity Movements Upper (arms, wrists, hands, fingers): None, normal Lower (legs, knees, ankles, toes): None, normal, Trunk Movements Neck, shoulders, hips: None, normal, Overall Severity Severity of abnormal movements (highest score from questions above): None, normal Incapacitation due to abnormal movements: None, normal Patient's awareness of abnormal movements (rate only patient's report): No Awareness, Dental Status Current problems with teeth and/or dentures?: Yes Does patient usually wear dentures?: Yes  CIWA:  CIWA-Ar Total: 14 COWS:     Musculoskeletal: Strength & Muscle Tone: within normal limits Gait & Station: broad based Patient leans: N/A  Psychiatric Specialty Exam: Physical Exam  Constitutional: He is  oriented to person, place, and time. No distress.  HENT:  Head: Normocephalic.  Eyes: Pupils are equal, round, and reactive to light.  Neck: Normal range of motion. Neck supple.  Cardiovascular: Normal rate.   Neurological: He is alert and oriented to person, place, and time.  Skin: He is not diaphoretic.  Psychiatric:  As above    ROS  Blood pressure (!) 141/77, pulse 75, temperature 99 F (37.2 C), temperature source Oral, resp. rate 20, height 5\' 9"  (1.753 m), weight 93 kg (205 lb), SpO2 95 %.Body mass index is 30.27 kg/m.  General Appearance: Less tremors today. Not sweaty, cooperative and appropriate. Does not appear internally distressed.   Eye Contact:  Good  Speech:  Clear and Coherent and Normal Rate  Volume:  Normal  Mood:  Less anxious  Affect:  Appropriate and Constricted  Thought Process:  Linear  Orientation:  Full (Time, Place, and Person)  Thought Content:  Worried about placement. No delusional theme.   Suicidal Thoughts:  No  Homicidal Thoughts:  No  Memory:  Immediate;   Fair Recent;   Fair Remote;   Fair  Judgement:  Fair  Insight:  Good  Psychomotor Activity:  Normal  Concentration:  Concentration: Fair and Attention Span: Fair  Recall:  Fair  Fund of Knowledge:  Good  Language:  Good  Akathisia:  Negative  Handed:    AIMS (if indicated):     Assets:  Communication Skills Desire for Improvement Resilience  ADL's:  Intact  Cognition:  WNL  Sleep:  Number of Hours: 4.25     Treatment Plan Summary:  Patient is still coming off alcohol. He is depressed and withdrawn. He remains at risk of seizures. We continues to require inpatient detox. We would evaluate him further  Psychiatric: MDD recurrent AUD  Medical: Seizure disorder HTN HLD GERD Chronic pain COPD  Psychosocial:  Limited support Homelessness  PLAN: 1. Azithromycin for LRI 2. Increase Prozac to 40 mg daily from tomorrow.  2. Continue to monitor mood, behavior and  interactions with peers  Georgiann Cocker, MD 06/15/2017, 2:04 PM

## 2017-06-15 NOTE — [facility] Group Notes (Signed)
[facility] LCSW Group Therapy  06/15/2017 3:17 PM  Type of Therapy:  Group Therapy  Participation Level:  Did Not Attend-pt invited. Chose to remain in bed to rest.   Summary of Progress/Problems: Today's Topic: Overcoming Obstacles. Patients identified one short term goal and potential obstacles in reaching this goal. Patients processed barriers involved in overcoming these obstacles. Patients identified steps necessary for overcoming these obstacles and explored motivation (internal and external) for facing these difficulties head on.   Nakeda Lebron N Smart LCSW 06/15/2017, 3:17 PM

## 2017-06-15 NOTE — Progress Notes (Signed)
Noted the following entry in his self inventory: "Thinking of sudaside or hurting someone else that get in my way."  Sat 1:1 with pt to inquire what he meant- "I don't remember what I meant by this, I don't feel that way."  Pt is able to verbally contract for safety.

## 2017-06-15 NOTE — Progress Notes (Signed)
D: Pt at the time of assessment was flat, isolative and withdrawn to room-remained in bed with eyes closed. Pt at the time endorsed moderate anxiety, depression and SOB from h/o COPD; states, "I feel terrible; I am anxious and depressed for putting myself into all these." Pt denied pain, SI, HI or AVH.  A: Medications including albuterol inhaler offered as prescribed. All patient's questions and concerns addressed. Support, encouragement, and safe environment provided. 15-minute safety checks continue. R: Pt was med compliant. Pt did not attend the AA group meeting. Safety checks continue.

## 2017-06-15 NOTE — Progress Notes (Signed)
Recreation Therapy Notes  Date: 06/15/2017 Time: 9:30am Location: 300 Hall Dayroom  Group Topic: Stress Management  Goal Area(s) Addresses:  Patient will verbalize importance of using healthy stress management.  Patient will identify positive emotions associated with healthy stress management.   Intervention: Stress Management  Activity : Deep Breathing Meditation. Recreation Therapy Intern introduced the stress management technique of meditation. Recreation Therapy Intern played a YouTube video that helped pts work on deep breathing with beach sounds in the background. Patients were to follow along as the video played to engage in the activity.  Education: Stress Management, Discharge Planning.   Education Outcome: Acknowledges edcuation  Clinical Observations/Feedback: Pt did not attend group.  Rachel Meyer, Recreation Therapy Intern  Doylene Splinter, LRT/CTRS 

## 2017-06-15 NOTE — [facility] Suicide Risk Assessment (Addendum)
[facility] INPATIENT:  Family/Significant Other Suicide Prevention Education  Suicide Prevention Education:  Contact Attempts: Kennith Maeseggy Clem (pt's friend) [phone] has been identified by the patient as the family member/significant other with whom the patient will be residing, and identified as the person(s) who will aid the patient in the event of a mental health crisis.  With written consent from the patient, two attempts were made to provide suicide prevention education, prior to and/or following the patient's discharge.  We were unsuccessful in providing suicide prevention education.  A suicide education pamphlet was given to the patient to share with family/significant other.  Date and time of first attempt: 06/15/17 at 3:52PM (unable to leave voicemail/no answer).  Date and time of second attempt: 06/17/17 at 10:20AM  Pulte HomesHeather N Smart LCSW 06/15/2017, 3:53 PM
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
## 2017-06-15 NOTE — Tx Team (Signed)
Interdisciplinary Treatment and Diagnostic Plan Update  06/15/2017 Time of Session: [place] MRN: [id number]  Principal Diagnosis: MDD, recurrent, severe, without psychotic features  Secondary Diagnoses: Active Problems:   Severe recurrent major depression without psychotic features (HCC)   Current Medications:  Current Facility-Administered Medications  Medication Dose Route Frequency Provider Last Rate Last Dose  . acetaminophen (TYLENOL) tablet 650 mg  650 mg Oral Q6H PRN Rozetta Nunnery, NP   650 mg at 06/13/17 2044  . albuterol (PROVENTIL HFA;VENTOLIN HFA) 108 (90 Base) MCG/ACT inhaler 2 puff  2 puff Inhalation Q6H PRN Lindon Romp A, NP   2 puff at 06/15/17 0325  . alum & mag hydroxide-simeth (MAALOX/MYLANTA) 200-200-20 MG/5ML suspension 30 mL  30 mL Oral Q4H PRN Lindon Romp A, NP      . FLUoxetine (PROZAC) capsule 20 mg  20 mg Oral Daily [name], Laruth Bouchard, MD   20 mg at 06/15/17 0804  . hydrOXYzine (ATARAX/VISTARIL) tablet 25 mg  25 mg Oral Q6H PRN Lindon Romp A, NP   25 mg at 06/13/17 2045  . loperamide (IMODIUM) capsule 2-4 mg  2-4 mg Oral PRN Lindon Romp A, NP      . LORazepam (ATIVAN) tablet 1 mg  1 mg Oral Q6H PRN Lindon Romp A, NP   1 mg at 06/15/17 0606  . LORazepam (ATIVAN) tablet 1 mg  1 mg Oral TID Rozetta Nunnery, NP       Followed by  . [START ON 06/16/2017] LORazepam (ATIVAN) tablet 1 mg  1 mg Oral BID Rozetta Nunnery, NP       Followed by  . [START ON 06/18/2017] LORazepam (ATIVAN) tablet 1 mg  1 mg Oral Daily Lindon Romp A, NP      . magnesium hydroxide (MILK OF MAGNESIA) suspension 30 mL  30 mL Oral Daily PRN Lindon Romp A, NP      . multivitamin with minerals tablet 1 tablet  1 tablet Oral Daily Lindon Romp A, NP   1 tablet at 06/15/17 0804  . ondansetron (ZOFRAN-ODT) disintegrating tablet 4 mg  4 mg Oral Q6H PRN Lindon Romp A, NP   4 mg at 06/14/17 0631  . Oxcarbazepine (TRILEPTAL) tablet 600 mg  600 mg Oral BID Artist Beach, MD   600 mg  at 06/15/17 0804  . thiamine (VITAMIN B-1) tablet 100 mg  100 mg Oral Daily Lindon Romp A, NP   100 mg at 06/15/17 0804  . traZODone (DESYREL) tablet 50 mg  50 mg Oral QHS [name], Laruth Bouchard, MD   50 mg at 06/14/17 2113   PTA Medications: Prescriptions Prior to Admission  Medication Sig Dispense Refill Last Dose  . albuterol (PROVENTIL HFA;VENTOLIN HFA) 108 (90 BASE) MCG/ACT inhaler Inhale 2 puffs into the lungs every 6 (six) hours as needed. :For shortness of breath.     Marland Kitchen FLUoxetine (PROZAC) 20 MG capsule Take 1 capsule (20 mg total) by mouth daily. :For depression 30 capsule 0   . haloperidol (HALDOL) 5 MG tablet Take 1 tablet (5 mg total) by mouth 2 (two) times daily. For mood control 60 tablet 0   . mometasone-formoterol (DULERA) 200-5 MCG/ACT AERO Inhale 2 puffs into the lungs 2 (two) times daily. For wheezing/shortness of breath. 1 Inhaler 3   . oxcarbazepine (TRILEPTAL) 600 MG tablet Take 1 tablet (600 mg total) by mouth 2 (two) times daily. :For mood stabilization 60 tablet 0   . traZODone (DESYREL) 150 MG tablet Take  1 tablet (150 mg total) by mouth at bedtime. For sleep 30 tablet 0     Patient Stressors: Substance abuse  Patient Strengths: Ability for insight Agricultural engineer for treatment/growth  Treatment Modalities: Medication Management, Group therapy, Case management,  1 to 1 session with clinician, Psychoeducation, Recreational therapy.   Physician Treatment Plan for Primary Diagnosis: MDD, recurrent, severe, without psychotic features Long Term Goal(s): Improvement in symptoms so as ready for discharge Improvement in symptoms so as ready for discharge   Short Term Goals: Ability to identify changes in lifestyle to reduce recurrence of condition will improve Ability to verbalize feelings will improve Ability to disclose and discuss suicidal ideas Ability to demonstrate self-control will improve Ability to identify and develop effective coping  behaviors will improve Ability to maintain clinical measurements within normal limits will improve Compliance with prescribed medications will improve Ability to identify triggers associated with substance abuse/mental health issues will improve Ability to identify changes in lifestyle to reduce recurrence of condition will improve Ability to verbalize feelings will improve Ability to disclose and discuss suicidal ideas Ability to demonstrate self-control will improve Ability to identify and develop effective coping behaviors will improve Ability to maintain clinical measurements within normal limits will improve Compliance with prescribed medications will improve Ability to identify triggers associated with substance abuse/mental health issues will improve  Medication Management: Evaluate patient's response, side effects, and tolerance of medication regimen.  Therapeutic Interventions: 1 to 1 sessions, Unit Group sessions and Medication administration.  Evaluation of Outcomes: Not Met  Physician Treatment Plan for Secondary Diagnosis: Active Problems:   Severe recurrent major depression without psychotic features ([place])  Long Term Goal(s): Improvement in symptoms so as ready for discharge Improvement in symptoms so as ready for discharge   Short Term Goals: Ability to identify changes in lifestyle to reduce recurrence of condition will improve Ability to verbalize feelings will improve Ability to disclose and discuss suicidal ideas Ability to demonstrate self-control will improve Ability to identify and develop effective coping behaviors will improve Ability to maintain clinical measurements within normal limits will improve Compliance with prescribed medications will improve Ability to identify triggers associated with substance abuse/mental health issues will improve Ability to identify changes in lifestyle to reduce recurrence of condition will improve Ability to verbalize  feelings will improve Ability to disclose and discuss suicidal ideas Ability to demonstrate self-control will improve Ability to identify and develop effective coping behaviors will improve Ability to maintain clinical measurements within normal limits will improve Compliance with prescribed medications will improve Ability to identify triggers associated with substance abuse/mental health issues will improve     Medication Management: Evaluate patient's response, side effects, and tolerance of medication regimen.  Therapeutic Interventions: 1 to 1 sessions, Unit Group sessions and Medication administration.  Evaluation of Outcomes: Not Met   RN Treatment Plan for Primary Diagnosis: MDD, recurrent, severe, without psychotic features Long Term Goal(s): Knowledge of disease and therapeutic regimen to maintain health will improve  Short Term Goals: Ability to remain free from injury will improve, Ability to verbalize feelings will improve and Ability to disclose and discuss suicidal ideas  Medication Management: RN will administer medications as ordered by provider, will assess and evaluate patient's response and provide education to patient for prescribed medication. RN will report any adverse and/or side effects to prescribing provider.  Therapeutic Interventions: 1 on 1 counseling sessions, Psychoeducation, Medication administration, Evaluate responses to treatment, Monitor vital signs and CBGs as ordered, Perform/monitor CIWA, COWS, AIMS and  Fall Risk screenings as ordered, Perform wound care treatments as ordered.  Evaluation of Outcomes: Not Met   LCSW Treatment Plan for Primary Diagnosis: MDD, recurrent, severe, without psychotic features Long Term Goal(s): Safe transition to appropriate next level of care at discharge, Engage patient in therapeutic group addressing interpersonal concerns.  Short Term Goals: Engage patient in aftercare planning with referrals and resources,  Facilitate patient progression through stages of change regarding substance use diagnoses and concerns and Identify triggers associated with mental health/substance abuse issues  Therapeutic Interventions: Assess for all discharge needs, 1 to 1 time with Social worker, Explore available resources and support systems, Assess for adequacy in community support network, Educate family and significant other(s) on suicide prevention, Complete Psychosocial Assessment, Interpersonal group therapy.  Evaluation of Outcomes: Not Met   Progress in Treatment: Attending groups: No. New to unit. Continuing to assess.  Participating in groups: No. Taking medication as prescribed: Yes. Toleration medication: Yes. Family/Significant other contact made: No, will contact:  family member if patient consents Patient understands diagnosis: Yes. Discussing patient identified problems/goals with staff: Yes. Medical problems stabilized or resolved: No. Denies suicidal/homicidal ideation: Yes. Issues/concerns per patient self-inventory: No. Other: n/a  New problem(s) identified: No, Describe:  n/a  New Short Term/Long Term Goal(s): detox, medication management for mood stabilization; and development of comprehensive mental wellness/sobriety plan.   Discharge Plan or Barriers: CSW assessing for appropriate referrals. Pt recently relapsed and was kicked out of [facility].   Reason for Continuation of Hospitalization: Depression Medication stabilization Suicidal ideation Withdrawal symptoms  Estimated Length of Stay: Thursday, 06/18/17  Attendees: Patient: 06/15/2017 10:11 AM  Physician: Dr. Sanjuana Letters MD; Dr. Parke Poisson MD 06/15/2017 10:11 AM  Nursing: Vladimir Faster RN; Freda Munro RN 06/15/2017 10:11 AM  RN Care Manager: Carrington Clamp CM 06/15/2017 10:11 AM  Social Worker: Maxie Better, LCSW 06/15/2017 10:11 AM  Recreational Therapist: x 06/15/2017 10:11 AM  Other: Ricky Ala NP 06/15/2017 10:11 AM  Other:  06/15/2017 10:11  AM  Other: 06/15/2017 10:11 AM    Scribe for Treatment Team: [place], LCSW 06/15/2017 10:11 AM

## 2017-06-16 ENCOUNTER — Encounter (HOSPITAL_COMMUNITY): Payer: Self-pay | Admitting: Behavioral Health

## 2017-06-16 DIAGNOSIS — E785 Hyperlipidemia, unspecified: Secondary | ICD-10-CM

## 2017-06-16 DIAGNOSIS — K219 Gastro-esophageal reflux disease without esophagitis: Secondary | ICD-10-CM

## 2017-06-16 DIAGNOSIS — Z590 Homelessness: Secondary | ICD-10-CM

## 2017-06-16 DIAGNOSIS — I10 Essential (primary) hypertension: Secondary | ICD-10-CM

## 2017-06-16 DIAGNOSIS — F1910 Other psychoactive substance abuse, uncomplicated: Secondary | ICD-10-CM

## 2017-06-16 DIAGNOSIS — F17210 Nicotine dependence, cigarettes, uncomplicated: Secondary | ICD-10-CM

## 2017-06-16 DIAGNOSIS — R05 Cough: Secondary | ICD-10-CM

## 2017-06-16 DIAGNOSIS — F332 Major depressive disorder, recurrent severe without psychotic features: Principal | ICD-10-CM

## 2017-06-16 DIAGNOSIS — R110 Nausea: Secondary | ICD-10-CM

## 2017-06-16 DIAGNOSIS — R45850 Homicidal ideations: Secondary | ICD-10-CM

## 2017-06-16 DIAGNOSIS — J449 Chronic obstructive pulmonary disease, unspecified: Secondary | ICD-10-CM

## 2017-06-16 NOTE — Progress Notes (Signed)
[facility] MD Progress Note  06/16/2017 12:49 PM GOLDMAN BIRCHALL  MRN:  [id number]  Subjective:  " I am just not feeling good. I am still having withdrawal symptoms. Still having death thoughts."  Evaluation on the unit: Face to face evaluation completed, case discussed during treatment team, chart reviewed.  Arlynn is a 56 yo Caucasian male presented to [facility] with a background history of Alcohol Use Disorder and MDD. During initial evaluation, patient endorsed he was withdrawing from alcohol. He also expressed hopelessness and suicidal thoughts. During evaluation today, patient continues to endorse withdrawal symptoms that includes nausea, headache, and tremors. Mild tremors are visibly noted. He endorses racing thoughts and passive death wishes yet denies any specific SI with plan or intent. Endorses HI towards his "ex" yet denies any plan or intent. At this time, he is able to contract for safety on the unit. Endorses improvement in appetite and poor sleeping pattern, He appears to be tolerating medications well and denies medication related adverse effects/side effects. No behavioral issues reported or observed. As per staff, patient is not participant in unit milieu. Has denies any visual or auditory  Hallucinations and does not appear to be internally preoccupied. He continues to be worried about his aftercare and discharge disposition and endorses this as a primary stressor.   Principal Problem: MDD Recurrent Diagnosis:   Patient Active Problem List   Diagnosis Date Noted  . Severe recurrent major depression without psychotic features (HCC) [F33.2] 06/13/2017  . Alcohol dependence (HCC) [F10.20] 11/27/2012  . GERD (gastroesophageal reflux disease) [K21.9] 02/12/2012  . Hypertension [I10] 01/06/2012  . COPD (chronic obstructive pulmonary disease) (HCC) [J44.9] 01/06/2012  . Hyperlipidemia [E78.5] 01/06/2012  . Anxiety [F41.9] 01/06/2012  . Neck pain, chronic [M54.2, G89.29] 01/06/2012   Total Time  spent with patient: 20 minutes  Past Psychiatric History: As in H&P  Past Medical History:  Past Medical History:  Diagnosis Date  . Arthritis   . COPD (chronic obstructive pulmonary disease) (HCC)   . Depression   . Hyperlipidemia   . Hypertension     Past Surgical History:  Procedure Laterality Date  . BACK SURGERY    . NECK SURGERY     Family History: History reviewed. No pertinent family history. Family Psychiatric  History: As in H&P Social History:  History  Alcohol Use  . Yes    Comment: Pt drinks daily . unknown amount     History  Drug Use No    Social History   Social History  . Marital status: Single    Spouse name: N/A  . Number of children: N/A  . Years of education: N/A   Social History Main Topics  . Smoking status: Current Every Day Smoker    Packs/day: 1.00    Years: 30.00    Types: Cigarettes  . Smokeless tobacco: Never Used  . Alcohol use Yes     Comment: Pt drinks daily . unknown amount  . Drug use: No  . Sexual activity: Not Currently   Other Topics Concern  . 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  Has daughter   Additional Social History:    Pain Medications: see PTA meds Prescriptions: see PTA meds Over the Counter: see PTA meds History of alcohol / drug use?: Yes Longest period of sobriety (when/how long): 3.5 years ended @ 5 days ago Name of Substance 1: Alcohol 1 - Frequency: daily 1 - Duration: ongoing for @  5 days 1 - Last Use / Amount: last night     Sleep: Poor  Appetite:  impoving   Current Medications: Current Facility-Administered Medications  Medication Dose Route Frequency Provider Last Rate Last Dose  . acetaminophen (TYLENOL) tablet 650 mg  650 mg Oral Q6H PRN Nira ConnBerry, Jason A, NP   650 mg at 06/16/17 1132  . albuterol (PROVENTIL HFA;VENTOLIN HFA) 108 (90 Base) MCG/ACT inhaler 2 puff  2 puff Inhalation Q6H PRN Nira ConnBerry, Jason A, NP   2 puff at 06/15/17 0325   . alum & mag hydroxide-simeth (MAALOX/MYLANTA) 200-200-20 MG/5ML suspension 30 mL  30 mL Oral Q4H PRN Nira ConnBerry, Jason A, NP      . azithromycin (ZITHROMAX) tablet 250 mg  250 mg Oral Daily [name], [name], MD   250 mg at 06/16/17 0844  . FLUoxetine (PROZAC) capsule 40 mg  40 mg Oral Daily [name], [name], MD   40 mg at 06/16/17 0845  . hydrOXYzine (ATARAX/VISTARIL) tablet 25 mg  25 mg Oral Q6H PRN Nira ConnBerry, Jason A, NP   25 mg at 06/15/17 2017  . loperamide (IMODIUM) capsule 2-4 mg  2-4 mg Oral PRN Nira ConnBerry, Jason A, NP   4 mg at 06/15/17 2016  . LORazepam (ATIVAN) tablet 1 mg  1 mg Oral Q6H PRN Nira ConnBerry, Jason A, NP   1 mg at 06/16/17 1132  . LORazepam (ATIVAN) tablet 1 mg  1 mg Oral BID Jackelyn PolingBerry, Jason A, NP       Followed by  . [START ON 06/18/2017] LORazepam (ATIVAN) tablet 1 mg  1 mg Oral Daily Nira ConnBerry, Jason A, NP      . magnesium hydroxide (MILK OF MAGNESIA) suspension 30 mL  30 mL Oral Daily PRN Nira ConnBerry, Jason A, NP      . multivitamin with minerals tablet 1 tablet  1 tablet Oral Daily Nira ConnBerry, Jason A, NP   1 tablet at 06/16/17 0844  . ondansetron (ZOFRAN-ODT) disintegrating tablet 4 mg  4 mg Oral Q6H PRN Nira ConnBerry, Jason A, NP   4 mg at 06/15/17 2016  . Oxcarbazepine (TRILEPTAL) tablet 600 mg  600 mg Oral BID Georgiann CockerIzediuno, [name], MD   600 mg at 06/16/17 0845  . thiamine (VITAMIN B-1) tablet 100 mg  100 mg Oral Daily Nira ConnBerry, Jason A, NP   100 mg at 06/16/17 0845  . traZODone (DESYREL) tablet 50 mg  50 mg Oral QHS [name], [name], MD   50 mg at 06/15/17 2135    Lab Results:  No results found for this or any previous visit (from the past 48 hour(s)).  Blood Alcohol level:  Lab Results  Component Value Date   ETH 316 (H) 11/26/2012   ETH 212 (H) 03/09/2012    Metabolic Disorder Labs: No results found for: HGBA1C, MPG No results found for: PROLACTIN Lab Results  Component Value Date   CHOL 144 06/14/2017   TRIG 174 (H) 06/14/2017   HDL 62 06/14/2017   CHOLHDL 2.3 06/14/2017   VLDL 35  06/14/2017   LDLCALC 47 06/14/2017    Physical Findings: AIMS: Facial and Oral Movements Muscles of Facial Expression: None, normal Lips and Perioral Area: None, normal Jaw: None, normal Tongue: None, normal,Extremity Movements Upper (arms, wrists, hands, fingers): None, normal Lower (legs, knees, ankles, toes): None, normal, Trunk Movements Neck, shoulders, hips: None, normal, Overall Severity Severity of abnormal movements (highest score from questions above): None, normal Incapacitation due to abnormal movements: None, normal Patient's awareness of abnormal movements (rate only  patient's report): No Awareness, Dental Status Current problems with teeth and/or dentures?: Yes Does patient usually wear dentures?: Yes  CIWA:  CIWA-Ar Total: 6 COWS:     Musculoskeletal: Strength & Muscle Tone: within normal limits Gait & Station: broad based Patient leans: N/A  Psychiatric Specialty Exam: Physical Exam  Constitutional: He is oriented to person, place, and time. No distress.  HENT:  Head: Normocephalic.  Eyes: Pupils are equal, round, and reactive to light.  Neck: Normal range of motion. Neck supple.  Cardiovascular: Normal rate.   Neurological: He is alert and oriented to person, place, and time.  Skin: He is not diaphoretic.  Psychiatric:  As above    Review of Systems  Respiratory: Positive for cough.   Gastrointestinal: Positive for nausea.  Neurological: Positive for tremors and headaches.  Psychiatric/Behavioral: Positive for depression and substance abuse. Negative for hallucinations, memory loss and suicidal ideas. The patient is nervous/anxious. The patient does not have insomnia.   All other systems reviewed and are negative.   Blood pressure 125/83, pulse 72, temperature 98.6 F (37 C), temperature source Oral, resp. rate 20, height 5\' 9"  (1.753 m), weight 205 lb (93 kg), SpO2 95 %.Body mass index is 30.27 kg/m.  General Appearance: mild tremors observed.     Eye Contact:  Good  Speech:  Clear and Coherent and Normal Rate  Volume:  Normal  Mood:  Depressed   Affect:  Appropriate  Thought Process:  Linear  Orientation:  Full (Time, Place, and Person)  Thought Content:  Worried about placement. No delusional theme. No AVH.  Suicidal Thoughts:  No yet endorses passive death wishes. Is able to contract for safety on the unit  Homicidal Thoughts:  Yes.  without intent/plan  Memory:  Immediate;   Fair Recent;   Fair Remote;   Fair  Judgement:  Fair  Insight:  Good  Psychomotor Activity:  Normal  Concentration:  Concentration: Fair and Attention Span: Fair  Recall:  [employer] of Knowledge:  Good  Language:  Good  Akathisia:  Negative  Handed:    AIMS (if indicated):     Assets:  Communication Skills Desire for Improvement Resilience  ADL's:  Intact  Cognition:  WNL  Sleep:  Number of Hours: 3     Treatment Plan Summary:  Patient continues to detox from alcohol. He continues to endorse withdrawal symptoms as noted above. He remains depressed and withdrawn. He remains at risk of seizures. Will continue the following treatment plan without adjustments;   Psychiatric: MDD recurrent AUD  Medical: Seizure disorder HTN HLD GERD Chronic pain COPD  Psychosocial:  Limited support Homelessness  PLAN: 1.Conitnue Azithromycin  250 mg po daily for LRI 2. Contnue Prozac to 40 mg daily for depression (increased 06/16/2017). 3. Continue Trileptal 600 mg po bid for mood stabilization. 4.Continue Trazodone 50 mg po daily at bedtime for insomnia.  4. Continue to monitor mood, behavior and interactions with peers 5. Continue Ativan detox protocol and CIWA.  6. Continue Albuterol Inhaler 2 puffs q6hrs as needed for wheezing and SOB.   Denzil Magnuson, NP 06/16/2017, 12:49 PM   Patient ID: Beryle Quant, male   DOB: 1961/09/29, 57 y.o.   MRN: [id number] Agree with NP Progress Note

## 2017-06-16 NOTE — Progress Notes (Signed)
DAR NOTE: Patient presents with anxious affect and depressed mood.  Denies pain, auditory and visual hallucinations.  Rates depression at 4, hopelessness at 5, and anxiety at 4.  Maintained on routine safety checks.  Medications given as prescribed. Support and encouragement offered as needed. Patient observed socializing with peers in the dayroom.  Offered no complaint.

## 2017-06-16 NOTE — Progress Notes (Signed)
At the beginning of the shift, pt was in bed, c/o nausea and diarrhea.  He also was c/o w/d symptoms from alcohol.  He reported to Clinical research associatewriter that he has been coughing off and on.  Pt denies SI/HI/AVH at this time.  He states that he just feels tired and weak.  Writer medicated pt for his symptoms.  Shortly after receiving the medication, he came to the dayroom to watch TV and get a snack.  He said the prn meds were helpful.  He received Ativan 1 mg at bedtime along with a scheduled sleep aid.  Support and encouragement offered.  Discharge plans are in process.  [facility]  Safety maintained with q15 minute checks.

## 2017-06-16 NOTE — [facility] Group Notes (Signed)
[facility] LCSW Group Therapy  06/16/2017 3:46 PM  Type of Therapy:  Group Therapy  Participation Level:  Did Not Attend-pt invited. Chose to remain in bed.   Summary of Progress/Problems: [facility] Speaker came to talk about his personal journey with substance abuse and addiction. The pt processed ways by which to relate to the speaker. MHA speaker provided handouts and educational information pertaining to groups and services offered by the [facility].   Loys Shugars N Smart LCSW 06/16/2017, 3:46 PM

## 2017-06-17 ENCOUNTER — Encounter (HOSPITAL_COMMUNITY): Payer: Self-pay | Admitting: Behavioral Health

## 2017-06-17 MED ORDER — ATORVASTATIN CALCIUM 20 MG PO TABS
20.0000 mg | ORAL_TABLET | Freq: Every day | ORAL | Status: DC
  Administered 2017-06-17 – 2017-06-18 (×2): 20 mg via ORAL
  Filled 2017-06-17 (×3): qty 1
  Filled 2017-06-17: qty 2

## 2017-06-17 MED ORDER — HYDROXYZINE HCL 50 MG PO TABS
50.0000 mg | ORAL_TABLET | Freq: Four times a day (QID) | ORAL | Status: DC | PRN
  Administered 2017-06-18 (×2): 50 mg via ORAL
  Filled 2017-06-17 (×2): qty 1

## 2017-06-17 NOTE — Progress Notes (Signed)
D: Pt presents with a flat affect and anxious/depressed mood. Pt denies SI. Pt reports poor sleep due to racing thoughts and nightmares. Pt reports withdrawal symptoms of sweats, chills, body aches and nausea. Pt given meds per protocol. Pt reports increased depression and anxiety today. Pt rates depression 8/10. Anxiety 10/10.  A: Medications reviewed with pt. Medications administered as ordered per MD. Verbal support provided. Pt encouraged to attend groups. 15 minute checks performed for safety.  R: Pt compliant with tx.

## 2017-06-17 NOTE — Progress Notes (Addendum)
Please disregard this note.  Consent was obtained by sercretary for writer to speak with [name], Diplomatic Services operational officersecretary forwarded call to wrong RN. Information was pertaining to Jason Bean not Junita PushJames Grieves.   Per pt friend [name], pt takes Lipitor 40 mg daily at 6 pm, Metoprolol 12.5 BID, Asprin 81 mg daily and has three days left of prednisone taper 10 mg  3-2-1. Writer called the following pharmacies given by pt and pt friend [name]: [facility] [place] [facility] [place], [facility] [place] and [facility] [place]. None of these pharmacies have the above prescriptions on file except for Lipitor 20 mg daily was [facility]. West CarboLashonda NP., made aware. Writer asked pt to have home meds brought in. Pt stated that he doesn't have anyone to bring them in.

## 2017-06-17 NOTE — Progress Notes (Signed)
Recreation Therapy Notes  Date:06/17/2017 Time:9:30am Location: [address] Dayroom  Group Topic: Stress Management  Goal Area(s) Addresses:  Patient will verbalize importance of using healthy stress management.  Patient will identify positive emotions associated with healthy stress management.   Intervention: Stress Management  Activity: Calming Color Relaxation Visualization . Recreation Therapy Intern introduced the stress management technique of visualization. Recreation Therapy Intern read a script that allowed patients to mentally visualize different colors. Recreation Therapy Intern played calming flute music. Patients were to follow along as script was read to engage in the activity.  Education:Stress Management, Discharge Planning.   Education Outcome:Acknowledges edcuation  Clinical Observations/Feedback:Pt did not attend group.  Rachel Meyer, Recreation Therapy Intern  Simcha Speir, LRT/CTRS 

## 2017-06-17 NOTE — Progress Notes (Signed)
[facility]  06/17/2017 12:25 PM Jason QuantJames W Bean  MRN:  [id number]  Subjective:  " I feel bad. Still with withdrawal symptoms.."  Evaluation on the unit: Face to face evaluation completed, case discussed during treatment team, chart reviewed.  Jason Bean is a 56 yo Caucasian male [facility] with a background history of Alcohol Use Disorder and MDD. During initial evaluation, patient continues to endorse withdrawal symptoms (nausea, headache, tremors) without improvement. He rates depression as 10/10, hopelessness 4, anxiety 10/10. He denies IS, HI, AVH and does not appear to be internally preoccupied. Endorses eating and sleeping pattern as poor.  As per nursing, Pt reports he is doing better than yesterday, but is still having some mild withdrawal symptoms." He denies passive death wishes.  No behavioral issues reported or observed., He appears to be tolerating medications well and denies medication related adverse effects/side effects. At this time, he is able to contract for safety on the unit.   As per nurse, she spoke with patient friend who listed a number of medications patient was taking prior to his admission. Nurse attempted to verify medications with multiple pharmacies. Atorvastatin 20 mg was the only medication verified. [facility] and below.   Principal Problem: MDD Recurrent Diagnosis:   Patient Active Problem List   Diagnosis Date Noted  . Severe recurrent major depression without psychotic features (HCC) [F33.2] 06/13/2017  . Alcohol dependence (HCC) [F10.20] 11/27/2012  . GERD (gastroesophageal reflux disease) [K21.9] 02/12/2012  . Hypertension [I10] 01/06/2012  . COPD (chronic obstructive pulmonary disease) (HCC) [J44.9] 01/06/2012  . Hyperlipidemia [E78.5] 01/06/2012  . Anxiety [F41.9] 01/06/2012  . Neck pain, chronic [M54.2, G89.29] 01/06/2012   Total Time spent with patient: 20 minutes  Past Psychiatric History: As in H&P  Past Medical  History:  Past Medical History:  Diagnosis Date  . Arthritis   . COPD (chronic obstructive pulmonary disease) (HCC)   . Depression   . Hyperlipidemia   . Hypertension     Past Surgical History:  Procedure Laterality Date  . BACK SURGERY    . NECK SURGERY     Family History: History reviewed. No pertinent family history. Family Psychiatric  History: As in H&P Social History:  History  Alcohol Use  . Yes    Comment: Pt drinks daily . unknown amount     History  Drug Use No    Social History   Social History  . Marital status: Single    Spouse name: N/A  . Number of children: N/A  . Years of education: N/A   Social History Main Topics  . Smoking status: Current Every Day Smoker    Packs/day: 1.00    Years: 30.00    Types: Cigarettes  . Smokeless tobacco: Never Used  . Alcohol use Yes     Comment: Pt drinks daily . unknown amount  . Drug use: No  . Sexual activity: Not Currently   Other Topics Concern  . 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  Has daughter   Additional Social History:    Pain Medications: see PTA meds Prescriptions: see PTA meds Over the Counter: see PTA meds History of alcohol / drug use?: Yes Longest period of sobriety (when/how long): 3.5 years ended @ 5 days ago Name of Substance 1: Alcohol 1 - Frequency: daily 1 - Duration: ongoing for @ 5 days 1 - Last Use / Amount: last  night     Sleep: Poor  Appetite:  Poor  Current Medications: Current Facility-Administered Medications  Medication Dose Route Frequency Provider Last Rate Last Dose  . acetaminophen (TYLENOL) tablet 650 mg  650 mg Oral Q6H PRN Nira Conn A, NP   650 mg at 06/16/17 1132  . albuterol (PROVENTIL HFA;VENTOLIN HFA) 108 (90 Base) MCG/ACT inhaler 2 puff  2 puff Inhalation Q6H PRN Nira Conn A, NP   2 puff at 06/15/17 0325  . alum & mag hydroxide-simeth (MAALOX/MYLANTA) 200-200-20 MG/5ML suspension 30 mL  30 mL  Oral Q4H PRN Nira Conn A, NP      . azithromycin (ZITHROMAX) tablet 250 mg  250 mg Oral Daily [name], Delight Ovens, MD   250 mg at 06/17/17 0819  . FLUoxetine (PROZAC) capsule 40 mg  40 mg Oral Daily [name], Delight Ovens, MD   40 mg at 06/17/17 0818  . [START ON 06/18/2017] LORazepam (ATIVAN) tablet 1 mg  1 mg Oral Daily Nira Conn A, NP      . magnesium hydroxide (MILK OF MAGNESIA) suspension 30 mL  30 mL Oral Daily PRN Nira Conn A, NP      . multivitamin with minerals tablet 1 tablet  1 tablet Oral Daily Nira Conn A, NP   1 tablet at 06/17/17 0819  . Oxcarbazepine (TRILEPTAL) tablet 600 mg  600 mg Oral BID Georgiann Cocker, MD   600 mg at 06/17/17 0819  . thiamine (VITAMIN B-1) tablet 100 mg  100 mg Oral Daily Nira Conn A, NP   100 mg at 06/17/17 0819  . traZODone (DESYREL) tablet 50 mg  50 mg Oral QHS [name], Delight Ovens, MD   50 mg at 06/16/17 2136    Lab Results:  No results found for this or any previous visit (from the past 48 hour(s)).  Blood Alcohol level:  Lab Results  Component Value Date   ETH 316 (H) 11/26/2012   ETH 212 (H) 03/09/2012    Metabolic Disorder Labs: No results found for: HGBA1C, MPG No results found for: PROLACTIN Lab Results  Component Value Date   CHOL 144 06/14/2017   TRIG 174 (H) 06/14/2017   HDL 62 06/14/2017   CHOLHDL 2.3 06/14/2017   VLDL 35 06/14/2017   LDLCALC 47 06/14/2017    Physical Findings: AIMS: Facial and Oral Movements Muscles of Facial Expression: None, normal Lips and Perioral Area: None, normal Jaw: None, normal Tongue: None, normal,Extremity Movements Upper (arms, wrists, hands, fingers): None, normal Lower (legs, knees, ankles, toes): None, normal, Trunk Movements Neck, shoulders, hips: None, normal, Overall Severity Severity of abnormal movements (highest score from questions above): None, normal Incapacitation due to abnormal movements: None, normal Patient's awareness of abnormal movements (rate only  patient's report): No Awareness, Dental Status Current problems with teeth and/or dentures?: Yes Does patient usually wear dentures?: Yes  CIWA:  CIWA-Ar Total: 2 COWS:     Musculoskeletal: Strength & Muscle Tone: within normal limits Gait & Station: broad based Patient leans: N/A  Psychiatric Specialty Exam: Physical Exam  Nursing note and vitals reviewed. Constitutional: He is oriented to person, place, and time. No distress.  HENT:  Head: Normocephalic.  Eyes: Pupils are equal, round, and reactive to light.  Neck: Normal range of motion. Neck supple.  Cardiovascular: Normal rate.   Neurological: He is alert and oriented to person, place, and time.  Skin: He is not diaphoretic.  Psychiatric:  As above    Review of Systems  Respiratory: Positive for  cough.   Gastrointestinal: Positive for nausea.  Neurological: Positive for tremors and headaches.  Psychiatric/Behavioral: Positive for depression and substance abuse. Negative for hallucinations, memory loss and suicidal ideas. The patient is nervous/anxious. The patient does not have insomnia.   All other systems reviewed and are negative.   Blood pressure 122/83, pulse 75, temperature 98.4 F (36.9 C), temperature source Oral, resp. rate 18, height 5\' 9"  (1.753 m), weight 205 lb (93 kg), SpO2 95 %.Body mass index is 30.27 kg/m.  General Appearance: mild tremors observed.    Eye Contact:  Good  Speech:  Clear and Coherent and Normal Rate  Volume:  Normal  Mood:  Depressed   Affect:  Appropriate  Thought Process:  Linear  Orientation:  Full (Time, Place, and Person)  Thought Content:  Worried about placement. No delusional theme. No AVH.  Suicidal Thoughts:  No yet endorses passive death wishes. Is able to contract for safety on the unit  Homicidal Thoughts:  Yes.  without intent/plan  Memory:  Immediate;   Fair Recent;   Fair Remote;   Fair  Judgement:  Fair  Insight:  Good  Psychomotor Activity:  Normal   Concentration:  Concentration: Fair and Attention Span: Fair  Recall:  [employer]  Fund of Knowledge:  Good  Language:  Good  Akathisia:  Negative  Handed:    AIMS (if indicated):     Assets:  Communication Skills Desire for Improvement Resilience  ADL's:  Intact  Cognition:  WNL  Sleep:  Number of Hours: 5.75     Treatment Plan Summary:  Patient continues to detox from alcohol.  He continues to endorse withdrawal symptoms as noted above. He remains depressed yet as per staff, he is more engaged with peers.Marland Kitchen. He remains at risk of seizures. Will continue the following treatment plan with adjustments as noted;   Psychiatric: MDD recurrent AUD  Medical: Seizure disorder HTN HLD GERD Chronic pain COPD  Psychosocial:  Limited support Homelessness  PLAN: 1.Conitnue Azithromycin  250 mg po daily for LRI 2. Contnue Prozac to 40 mg daily for depression (increased 06/16/2017). 3. Continue Trileptal 600 mg po bid for mood stabilization. 4.Continue Trazodone 50 mg po daily at bedtime for insomnia.  4. Continue to monitor mood, behavior and interactions with peers 5. Continue Ativan detox protocol and CIWA.  6. Continue Albuterol Inhaler 2 puffs q6hrs as needed for wheezing and SOB.  7. Start Atrovastatin 20 mg po daily for hx hypercholesterolemia. Current triglyceride level 174.  Jason MagnusonLaShunda Thomas, NP 06/17/2017, 12:25 PM   Patient ID: Jason Bean, male   DOB: [birth year], 56 y.o.   MRN: [id number] Agree with Progress Note

## 2017-06-17 NOTE — Progress Notes (Signed)
Pt reports he is doing better than yesterday, but is still having some mild withdrawal symptoms.  He is aware that the prn meds from the protocol were discontinued this afternoon.  He says that the scheduled doses are sufficient for him right now.  He is aware that he is getting Trazodone 50mg  at bedtime.  Pt denies SI/HI/AVH at this time.  Support and encouragement offered.  Discharge plans are in process and pt may be going for long term treatment.  Safety maintained with q15 minute checks.

## 2017-06-18 ENCOUNTER — Encounter (HOSPITAL_COMMUNITY): Payer: Self-pay | Admitting: Behavioral Health

## 2017-06-18 DIAGNOSIS — G4700 Insomnia, unspecified: Secondary | ICD-10-CM

## 2017-06-18 MED ORDER — PRAZOSIN HCL 1 MG PO CAPS
1.0000 mg | ORAL_CAPSULE | Freq: Every day | ORAL | Status: DC
  Administered 2017-06-18: 1 mg via ORAL
  Filled 2017-06-18 (×2): qty 1

## 2017-06-18 NOTE — Progress Notes (Signed)
Patient did not attend group.

## 2017-06-18 NOTE — Progress Notes (Signed)
  D: Pt informed the writer that he has "tremors due to etoh abuse".  Stated, "relapsed started at a couple of beers. Then I drink til I pass out".  Stated his last drink was Saturday before adm. Pt informed the writer that he was "feeling anxious". Writer asked for and received order for prn vistaril.  A:  Support and encouragement was offered. 15 min checks continued for safety.  R: Pt remains safe.

## 2017-06-18 NOTE — Progress Notes (Signed)
[facility]-advised that pt must be out of the house for 30 days after relapse. [facility] referral made today.   Trula SladeHeather Smart, MSW, LCSW Clinical Social Worker 06/18/2017 10:54 AM

## 2017-06-18 NOTE — Progress Notes (Signed)
[facility]  06/18/2017 1:22 PM Beryle QuantJames W Cwynar  MRN:  [id number]  Subjective:  " I am doing better toady. Still worried about where I will go once I leave here. Working on getting an apartment."  Evaluation on the unit: Face to face evaluation completed, case discussed during treatment team, chart reviewed.  Fayrene FearingJames is a 56 yo Caucasian [facility] with a background history of Alcohol Use Disorder and MDD.   During thisl evaluation, patient seems to show slight improvement. He appears less depressed and anxious compared to previous days. He continues to endorse mild tremors however notes overall, withdrawal symptoms are with improvement. He endorses poor sleeping patterns secondary to  racing thoughts at night and nightmares. He denies any suicidal ideations with plan or intent. Denies homicidal ideas. Denies AVH and does not appear to be internally preoccupied. He continues to endorse his primary stressor is not having a place to go once discharge. Per CSW note,  [facility] He endorses appetite as improving.  He denies passive death wishes.  No behavioral issues reported or observed., He continues to take medications as prescribed and denies medication related side effects. At this time, he is able to contract for safety on the unit.     Principal Problem: MDD Recurrent Diagnosis:   Patient Active Problem List   Diagnosis Date Noted  . Severe recurrent major depression without psychotic features (HCC) [F33.2] 06/13/2017  . Alcohol dependence (HCC) [F10.20] 11/27/2012  . GERD (gastroesophageal reflux disease) [K21.9] 02/12/2012  . Hypertension [I10] 01/06/2012  . COPD (chronic obstructive pulmonary disease) (HCC) [J44.9] 01/06/2012  . Hyperlipidemia [E78.5] 01/06/2012  . Anxiety [F41.9] 01/06/2012  . Neck pain, chronic [M54.2, G89.29] 01/06/2012   Total Time spent with patient: 20 minutes  Past Psychiatric History: As in H&P  Past Medical History:   Past Medical History:  Diagnosis Date  . Arthritis   . COPD (chronic obstructive pulmonary disease) (HCC)   . Depression   . Hyperlipidemia   . Hypertension     Past Surgical History:  Procedure Laterality Date  . BACK SURGERY    . NECK SURGERY     Family History: History reviewed. No pertinent family history. Family Psychiatric  History: As in H&P Social History:  History  Alcohol Use  . Yes    Comment: Pt drinks daily . unknown amount     History  Drug Use No    Social History   Social History  . Marital status: Single    Spouse name: N/A  . Number of children: N/A  . Years of education: N/A   Social History Main Topics  . Smoking status: Current Every Day Smoker    Packs/day: 1.00    Years: 30.00    Types: Cigarettes  . Smokeless tobacco: Never Used  . Alcohol use Yes     Comment: Pt drinks daily . unknown amount  . Drug use: No  . Sexual activity: Not Currently   Other Topics Concern  . 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 acetaminophen (TYLENOL) tablet 650 mg  650 mg Oral Q6H PRN Nira Conn A, NP   650 mg at 06/16/17 1132  . albuterol (PROVENTIL HFA;VENTOLIN HFA) 108 (90 Base) MCG/ACT inhaler 2 puff  2 puff Inhalation Q6H PRN Nira Conn A, NP   2 puff at 06/15/17 0325  . alum & mag hydroxide-simeth (MAALOX/MYLANTA) 200-200-20 MG/5ML suspension 30 mL  30 mL Oral  Q4H PRN Nira Conn A, NP      . atorvastatin (LIPITOR) tablet 20 mg  20 mg Oral q1800 Denzil Magnuson, NP   20 mg at 06/17/17 1653  . azithromycin (ZITHROMAX) tablet 250 mg  250 mg Oral Daily [name], Delight Ovens, MD   250 mg at 06/18/17 0828  . FLUoxetine (PROZAC) capsule 40 mg  40 mg Oral Daily [name], [name], MD   40 mg at 06/18/17 0828  . hydrOXYzine (ATARAX/VISTARIL) tablet 50 mg  50 mg Oral Q6H PRN Kerry Hough, PA-C   50 mg at 06/18/17 0031  . magnesium hydroxide (MILK OF MAGNESIA) suspension 30 mL  30 mL Oral Daily PRN Nira Conn A, NP      . multivitamin with minerals tablet 1 tablet  1 tablet Oral Daily Nira Conn A, NP   1 tablet at 06/18/17 0828  . Oxcarbazepine (TRILEPTAL) tablet 600 mg  600 mg Oral BID Georgiann Cocker, MD   600 mg at 06/18/17 0827  . thiamine (VITAMIN B-1) tablet 100 mg  100 mg Oral Daily Nira Conn A, NP   100 mg at 06/18/17 2956  . traZODone (DESYREL) tablet 50 mg  50 mg Oral QHS [name], Delight Ovens, MD   50 mg at 06/17/17 2313    Lab Results:  No results found for this or any previous visit (from the past 48 hour(s)).  Blood Alcohol level:  Lab Results  Component Value Date   ETH 316 (H) 11/26/2012   ETH 212 (H) 03/09/2012    Metabolic Disorder Labs: No results found for: HGBA1C, MPG No results found for: PROLACTIN Lab Results  Component Value Date   CHOL 144 06/14/2017   TRIG 174 (H) 06/14/2017   HDL 62 06/14/2017   CHOLHDL 2.3 06/14/2017   VLDL 35 06/14/2017   LDLCALC 47 06/14/2017    Physical Findings: AIMS: Facial and Oral Movements Muscles of Facial Expression: None, normal Lips and Perioral Area: None, normal Jaw: None, normal Tongue: None, normal,Extremity Movements Upper (arms, wrists, hands, fingers): None, normal Lower (legs, knees, ankles, toes): None, normal, Trunk Movements Neck, shoulders, hips: None, normal, Overall Severity Severity of abnormal movements (highest score from questions above): None,  normal Incapacitation due to abnormal movements: None, normal Patient's awareness of abnormal movements (rate only patient's report): No Awareness, Dental Status Current problems with teeth and/or dentures?: Yes Does patient usually wear dentures?: Yes  CIWA:  CIWA-Ar Total: 3 COWS:     Musculoskeletal: Strength & Muscle Tone: within normal limits Gait & Station: broad based Patient leans: N/A  Psychiatric Specialty Exam: Physical Exam  Nursing note and vitals reviewed. Constitutional: He is oriented to person, place, and time. No distress.  HENT:  Head: Normocephalic.  Eyes: Pupils are equal, round, and reactive to light.  Neck: Normal range of motion. Neck supple.  Cardiovascular: Normal rate.  Neurological: He is alert and oriented to person, place, and time.  Skin: He is not diaphoretic.  Psychiatric:  As above    Review of Systems  Respiratory: Negative for cough.   Gastrointestinal: Negative for nausea.  Neurological: Positive for tremors. Negative for headaches.  Psychiatric/Behavioral: Positive for depression and substance abuse. Negative for hallucinations, memory loss and suicidal ideas. The patient is nervous/anxious and has insomnia.   All other systems reviewed and are negative.   Blood pressure 134/86, pulse 77, temperature 98.9 F (37.2 C), temperature source Oral, resp. rate 20, height 5\' 9"  (1.753 m), weight 205 lb (93 kg), SpO2 95 %.Body mass index is 30.27 kg/m.  General Appearance: fairly  groomed    Eye Contact:  Good  Speech:  Clear and Coherent and Normal Rate  Volume:  Normal  Mood:  Depressed; yet slight improvement    Affect:  Appropriate  Thought Process:  Linear  Orientation:  Full (Time, Place, and Person)  Thought Content:  Worried about placement. No delusional theme. No AVH.  Suicidal Thoughts:  No  Denies passive death wishes. Is able to contract for safety on the unit  Homicidal Thoughts:  No  Memory:  Immediate;   Fair Recent;    Fair Remote;   Fair  Judgement:  Fair  Insight:  Good  Psychomotor Activity:  mild tremors but improving    Concentration:  Concentration: Fair and Attention Span: Fair  Recall:  [employer]  Fund of Knowledge:  Good  Language:  Good  Akathisia:  Negative  Handed:    AIMS (if indicated):     Assets:  Communication Skills Desire for Improvement Resilience  ADL's:  Intact  Cognition:  WNL  Sleep:  Number of Hours: 4.75     Treatment Plan Summary:  Patient continues to detox from alcohol.  He endorses mild tremors yet as notes overall improvement. He appears to be less depressed. He remains at risk of seizures. No seizure activity has been reported or observed. He endorses poor sleeping pattern secondary to racing thoughts and nightmares. Will continue the following treatment plan with adjustments as noted;   Psychiatric: MDD recurrent AUD  Medical: Seizure disorder HTN HLD GERD Chronic pain COPD  Psychosocial:  Limited support Homelessness  PLAN: 1.Conitnue Azithromycin  250 mg po daily for LRI 2. Contnue Prozac to 40 mg daily for depression (increased 06/16/2017). 3. Continue Trileptal 600 mg po bid for mood stabilization. 4.Continue Trazodone 50 mg po daily at bedtime for insomnia.  4. Continue to monitor mood, behavior and interactions with peers 5. Continue Ativan detox protocol and CIWA.  6. Continue Albuterol Inhaler 2 puffs q6hrs as needed for wheezing and SOB.  7. Continue Atrovastatin 20 mg po daily for hx hypercholesterolemia. Current triglyceride level 174. 8. Start Prazosin 1 mg po daily at bedtime for nightmares. BP should be taking prior to administration.   Denzil MagnusonLaShunda Jaren Vanetten, NP 06/18/2017, 1:22 PM   Patient ID: Beryle QuantJames W Alsobrook, male   DOB: 25-May-1961, 56 y.o.   MRN: [id number]

## 2017-06-18 NOTE — Progress Notes (Signed)
D: Pt presents with a flat affect and depressed mood. Pt rates depression  7/10. Anxiety 7/10. Pt denies SI. Pt c/o sweats, chills, tremors and anxiety. Pt given meds per protocol. Pt stated that he's seeking long-term tx at a tx facility for substance abuse. Pt reports tolerating meds well. No side effects to meds verbalized by pt. A: Medications reviewed with pt. Medications administered as ordered per MD. Verbal support provided. Pt encouraged to attend groups. 15 minute checks performed for safety.  R: Pt receptive to tx.

## 2017-06-18 NOTE — [facility] Group Notes (Signed)
[facility] LCSW Group Therapy  06/18/2017 3:10 PM  Type of Therapy:  Group Therapy  Participation Level:  Did Not Attend-pt invited. Chose to remain in bed.   Summary of Progress/Problems:  Finding Balance in Life. Today's group focused on defining balance in one's own words, identifying things that can knock one off balance, and exploring healthy ways to maintain balance in life. Group members were asked to provide an example of a time when they felt off balance, describe how they handled that situation,and process healthier ways to regain balance in the future. Group members were asked to share the most important tool for maintaining balance that they [facility] and how they plan to apply this method after discharge.   Jaycub Noorani N Smart LCSW 06/18/2017, 3:10 PM

## 2017-06-18 NOTE — Progress Notes (Signed)
Patient did not attend the evening speaker NA meeting. Pt was encouraged to attend but remained in hallway reporting he feels "too closed up in there".

## 2017-06-19 ENCOUNTER — Encounter (HOSPITAL_COMMUNITY): Payer: Self-pay | Admitting: Behavioral Health

## 2017-06-19 DIAGNOSIS — F10129 Alcohol abuse with intoxication, unspecified: Secondary | ICD-10-CM

## 2017-06-19 DIAGNOSIS — Z560 Unemployment, unspecified: Secondary | ICD-10-CM

## 2017-06-19 MED ORDER — NICOTINE 21 MG/24HR TD PT24
21.0000 mg | MEDICATED_PATCH | Freq: Every day | TRANSDERMAL | Status: DC
  Filled 2017-06-19 (×2): qty 1

## 2017-06-19 MED ORDER — FLUOXETINE HCL 40 MG PO CAPS: 40.0000 mg | ORAL_CAPSULE | Freq: Every day | ORAL | 0 refills | Status: DC

## 2017-06-19 MED ORDER — TRAZODONE HCL 50 MG PO TABS: 50.0000 mg | ORAL_TABLET | Freq: Every day | ORAL | 0 refills | Status: DC

## 2017-06-19 MED ORDER — OXCARBAZEPINE 600 MG PO TABS: 600.0000 mg | ORAL_TABLET | Freq: Two times a day (BID) | ORAL | 0 refills | Status: DC

## 2017-06-19 MED ORDER — NICOTINE 21 MG/24HR TD PT24: 21.0000 mg | MEDICATED_PATCH | Freq: Every day | TRANSDERMAL | 0 refills | Status: DC

## 2017-06-19 MED ORDER — HYDROXYZINE HCL 50 MG PO TABS: 50.0000 mg | ORAL_TABLET | Freq: Four times a day (QID) | ORAL | 0 refills | Status: DC | PRN

## 2017-06-19 MED ORDER — PRAZOSIN HCL 1 MG PO CAPS: 1.0000 mg | ORAL_CAPSULE | Freq: Every day | ORAL | 0 refills | Status: DC

## 2017-06-19 MED ORDER — ATORVASTATIN CALCIUM 20 MG PO TABS: 20.0000 mg | ORAL_TABLET | Freq: Every day | ORAL | 0 refills | Status: DC

## 2017-06-19 NOTE — Progress Notes (Signed)
D: Pt was in bed in his room upon initial approach.  Pt presents with depressed affect and mood.  Goal is to "give up alcohol."  Pt forwards little information.  Pt denies SI/HI, denies hallucinations, reports withdrawal symptoms of "shakes, sweats, tremors."  Pt isolated to his room for the majority of the night.  He did not attend evening group.  A: Introduced self to pt.  Actively listened to pt and offered support and encouragement. Medications administered per order.  Q15 minute safety checks maintained.  R: Pt is safe on the unit.  Pt is compliant with medications.  Pt verbally contracts for safety.  Will continue to monitor and assess.

## 2017-06-19 NOTE — Progress Notes (Signed)
  [facility] Management Discharge Plan :  Will you be returning to the same living situation after discharge:  Yes,  pt plans to [place] [facility] at discharge At discharge, do you have transportation home?: Yes,  [place] bus [place] provided Do you have the ability to pay for your medications: Yes,  insurance  Release of information consent forms completed and submitted to medical records by CSW  Patient to Follow up at: Follow-up Information    [facility] Follow up.   Specialty:  Addiction Medicine Why:  Referral made: 06/18/17. If you are interested in pursuing treatment at this facility, please call admissions [facility]) daily to check status of referral/waitlist. Thank you  Contact information: [address] [place] [address] [phone]        [facility] Follow up on 06/25/2017.   Why:  Appt on Thursday, Aug 2nd at 8:30AM for hospital follow-up with Griffin DakinLauren McCray. Thank you. Contact information: [address] [place], [place] [address] Phone: [phone] Fax: [phone]          Next level of care provider has access to [facility] Link:no  Safety Planning and Suicide Prevention discussed: Yes,  SPE completed with pt; contact attempts made with pt's friend  Have you used any form of tobacco in the last 30 days? (Cigarettes, Smokeless Tobacco, Cigars, and/or Pipes): Yes  Has patient been referred to the Quitline?: Patient refused referral  Patient has been referred for addiction treatment: Yes  Cosmo Tetreault N Smart LCSW 06/19/2017, 10:37 AM

## 2017-06-19 NOTE — Progress Notes (Signed)
Recreation Therapy Notes  Date: 06/19/2017 Time: 9:30am Location: [address] Dayroom  Group Topic: Stress Management  Goal Area(s) Addresses:  Patient will verbalize importance of using healthy stress management.  Patient will identify positive emotions associated with healthy stress management.   Behavioral Response: Engaged  Intervention: Stress Management  Activity : Self-Esteem Meditation. Recreation Therapy Intern introduced the stress management technique of meditation. Recreation Therapy Intern read a script that allowed patients to focus on boosting their self-esteem. Recreation Therapy Intern played calming music. Patients were to follow along as script was read to engage in the activity.  Education: Stress Management, Discharge Planning.   Education Outcome: Acknowledges edcuation  Clinical Observations/Feedback: Pt attended group.  Rachel Meyer, Recreation Therapy Intern  Temisha Murley, LRT/CTRS 

## 2017-06-19 NOTE — [facility]HH Suicide Risk Assessment (Signed)
[facility]   Principal Problem: <principal problem not specified> Discharge Diagnoses:  Patient Active Problem List   Diagnosis Date Noted  . Severe recurrent major depression without psychotic features (HCC) [F33.2] 06/13/2017  . Alcohol dependence (HCC) [F10.20] 11/27/2012  . GERD (gastroesophageal reflux disease) [K21.9] 02/12/2012  . Hypertension [I10] 01/06/2012  . COPD (chronic obstructive pulmonary disease) (HCC) [J44.9] 01/06/2012  . Hyperlipidemia [E78.5] 01/06/2012  . Anxiety [F41.9] 01/06/2012  . Neck pain, chronic [M54.2, G89.29] 01/06/2012    Total Time spent with patient: 30 minutes  Musculoskeletal: Strength & Muscle Tone: within normal limits Gait & Station: normal Patient leans: N/A  Psychiatric Specialty Exam: ROS  Blood pressure 114/79, pulse 81, temperature 99.3 F (37.4 C), resp. rate 18, height 5\' 9"  (1.753 m), weight 93 kg (205 lb), SpO2 95 %.Body mass index is 30.27 kg/m.  General Appearance: Casual  Eye Contact::  Good  Speech:  Clear and Coherent409  Volume:  Normal  Mood:  Depressed  Affect:  Depressed  Thought Process:  Coherent and Goal Directed  Orientation:  Full (Time, Place, and Person)  Thought Content:  WDL  Suicidal Thoughts:  No  Homicidal Thoughts:  No  Memory:  Immediate;   Good Recent;   Fair Remote;   Fair  Judgement:  Fair  Insight:  Fair  Psychomotor Activity:  Decreased  Concentration:  Good  Recall:  Good  Fund of Knowledge:Good  Language: Good  Akathisia:  Negative  Handed:  Right  AIMS (if indicated):     Assets:  Communication Skills Desire for Improvement Financial Resources/Insurance Housing Intimacy Leisure Time Resilience Social Support Transportation  Sleep:  Number of Hours: 6.75  Cognition: WNL  ADL's:  Intact   Mental Status Per Nursing Assessment::   On Admission:  Suicidal ideation indicated by patient, Suicide plan, Plan includes specific time, place, or method,  Self-harm thoughts  Demographic Factors:  Male, Adolescent or young adult, Caucasian and Low socioeconomic status  Loss Factors: Decrease in vocational status and Financial problems/change in socioeconomic status  Historical Factors: Prior suicide attempts, Family history of suicide and Family history of mental illness or substance abuse  Risk Reduction Factors:   Sense of responsibility to family, Religious beliefs about death, Living with another person, especially a relative and Positive social support  Continued Clinical Symptoms:  Bipolar Disorder:   Depressive phase Depression:   Comorbid alcohol abuse/dependence Recent sense of peace/wellbeing Alcohol/Substance Abuse/Dependencies More than one psychiatric diagnosis Previous Psychiatric Diagnoses and Treatments  Cognitive Features That Contribute To Risk:  Polarized thinking    Suicide Risk:  Minimal: No identifiable suicidal ideation.  Patients presenting with no risk factors but with morbid ruminations; may be classified as minimal risk based on the severity of the depressive symptoms  Follow-up Information    [facility] Follow up.   Contact information: [address] [place] [phone]        [facility] Follow up.   Specialty:  Addiction Medicine Why:  Referral made: 06/18/17 Contact information: [address] [place] [address] [phone]           Plan Of Care/Follow-up recommendations:  Activity:  as tolerated Diet:  Regular  Leata MouseJANARDHANA Xavion Muscat, MD 06/19/2017, 10:09 AM

## 2017-06-19 NOTE — Discharge Summary (Signed)
Physician Discharge Summary Note  Patient:  Jason Bean is an 56 y.o., male MRN:  [id number] DOB:  Mar 10, 1961 Patient phone:  [phone] (home)  Patient address:   Homeless [place] [address],  Total Time spent with patient: 30 minutes  Date of Admission:  06/13/2017 Date of Discharge: 06/19/2017  Reason for Admission:  Alcohol use disorder, MDD  Principal Problem: <principal problem not specified> Discharge Diagnoses: Patient Active Problem List   Diagnosis Date Noted  . Alcohol abuse with intoxication (HCC) [F10.129]   . Severe recurrent major depression without psychotic features (HCC) [F33.2] 06/13/2017  . Alcohol dependence (HCC) [F10.20] 11/27/2012  . GERD (gastroesophageal reflux disease) [K21.9] 02/12/2012  . Hypertension [I10] 01/06/2012  . COPD (chronic obstructive pulmonary disease) (HCC) [J44.9] 01/06/2012  . Hyperlipidemia [E78.5] 01/06/2012  . Anxiety [F41.9] 01/06/2012  . Neck pain, chronic [M54.2, G89.29] 01/06/2012    Past Psychiatric History: Patient has been treated for depression with Prozac. He takes Amitriptyline for sleep. He is on Trileptal as a mood stabilizer and for treatment of seizures.  Patient has had multiple inpatient admissions over the years. Says it is usually related to suicidal thoughts/ behavior while under the influence of substances. He had overdosed in the past. He has cut his wrist a couple of times in the past. Has transient psychosis while coming off alcohol. No past history of mania. No past history of violent behavior.   Past Medical History:  Past Medical History:  Diagnosis Date  . Arthritis   . COPD (chronic obstructive pulmonary disease) (HCC)   . Depression   . Hyperlipidemia   . Hypertension     Past Surgical History:  Procedure Laterality Date  . BACK SURGERY    . NECK SURGERY     Family History: History reviewed. No pertinent family history. Family Psychiatric  History:  Family history of addiction. No family  history of suicide. No family history of mental illness.  Social History:  History  Alcohol Use  . Yes    Comment: Pt drinks daily . unknown amount     History  Drug Use No    Social History   Social History  . Marital status: Single    Spouse name: N/A  . Number of children: N/A  . Years of education: N/A   Social History Main Topics  . Smoking status: Current Every Day Smoker    Packs/day: 1.00    Years: 30.00    Types: Cigarettes  . Smokeless tobacco: Never Used  . Alcohol use Yes     Comment: Pt drinks daily . unknown amount  . Drug use: No  . Sexual activity: Not Currently   Other Topics Concern  . 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  Has daughter    Hospital Course: 89 yo Caucasian male, single, lives in a sober home, on SSI. Background history of Alcohol Use Disorder and MDD. [facility]. Patient was taken there by his friends. He was withdrawing from alcohol. He expressed hopelessness and suicidal thoughts.  At interview while sober, patient states that he had been sober for over three years. Says he had been doing fine until a week ago. Says he was influenced by a male friend and relapsed. He has been drinking daily since then. Drinks a lot of beer. No other substance use. Says [facility] has zero tolerance for alcohol and drugs. He has been kicked out of there.  Says he felt hopeless as he has lost everything " my friends there, my housing ,,, my volunteer job" . Feels disappointed in himself. Reports worsening depression since he started drinking again. His sleep wake cycle has been altered. He is waking early in the morning, he is not eating well. He was seeing stuff crawling on the wall yesterday. No associated auditory or tactile hallucinations. No associated feeling of persecution.  Says he felt there was no point going on like this. He had thoughts of suicide but did not have any  specific plans. Patient says he wants to get better and stay sober. No associated thoughts of violence. No associated homicidal thoughts. No assess to weapons.  After the above admission assessment, Jason Bean  presenting symptoms were identified. His UDS was positive for  Benzodiazepines. He presented with a history of ETOH abuse. Detoxification treatments administered as approrpoaite. He was medicated & discharged on; Prozac to 40 mg daily for depression, Trileptal 600 mg po bid for mood stabilization, Trazodone 50 mg po daily at bedtime for insomnia, Atrovastatin 20 mg po daily for hx hypercholesterolemia, Prazosin 1 mg po daily at bedtime for nightmares, Nicoderm patch for smoking cessation. He was enrolled & actively  participated in the group counseling sessions. AA/NA meetings were offered & held on this unit and patient actively particpated. He was able to verbalize coping skills that should help him cope better to maintain depression/mood stability upon returning home.  During the course of his hospitalization, improvement was monitored by observation and Jason Bean daily report of symptom reduction. Evidence was further noted by  presentation of good affect and improved mood & behavior.  He tolerated his treatment regimen without any adverse effects reported.  Jason Bean case was presented during treatment team meeting this morning. The team members all agreed that he was both mentally & medically stable to be discharged to continue mental health care on an outpatient basis as noted below. Patient did endorses some concerns about not having a place to go once discharge and this appeared to be his focused during his hospital course. Spoke with patient and CSW who agreed that a shelter was sufficent. Patient plans to go [place] [facility] at discharge. He was provided with all the necessary information needed to make this appointment without problems.  Upon discharge, Jason Bean denied any SIHI, AVH, delusional  thoughts or paranoia. He also denied any substance withdrawal symptoms. He was provided with a  prescription [facility] to be taken to his local pharmacy. [facility] with [facility] personal belongings in no apparent distress. Transportation per his arrangement.   Physical Findings: AIMS: Facial and Oral Movements Muscles of Facial Expression: None, normal Lips and Perioral Area: None, normal Jaw: None, normal Tongue: None, normal,Extremity Movements Upper (arms, wrists, hands, fingers): None, normal Lower (legs, knees, ankles, toes): None, normal, Trunk Movements Neck, shoulders, hips: None, normal, Overall Severity Severity of abnormal movements (highest score from questions above): None, normal Incapacitation due to abnormal movements: None, normal Patient's awareness of abnormal movements (rate only patient's report): No Awareness, Dental Status Current problems with teeth and/or dentures?: Yes Does patient usually wear dentures?: Yes  CIWA:  CIWA-Ar Total: 5 COWS:     Musculoskeletal: Strength & Muscle Tone: within normal limits Gait & Station: normal Patient leans: N/A  Psychiatric Specialty Exam: SEE SRA BY MD  Physical Exam  Nursing note and vitals reviewed. Constitutional: He is oriented to person, place, and time.  Neurological: He is alert and  oriented to person, place, and time.    Review of Systems  Psychiatric/Behavioral: Positive for substance abuse (Hx of substance abuse ). Negative for hallucinations, memory loss and suicidal ideas (Hx of substance abuse ). Depression: IMPROVED. Nervous/anxious: improved. Insomnia: improved.   All other systems reviewed and are negative.   Blood pressure 114/79, pulse 81, temperature 99.3 F (37.4 C), resp. rate 18, height 5\' 9"  (1.753 m), weight 205 lb (93 kg), SpO2 95 %.Body mass index is 30.27 kg/m.   Have you used any form of tobacco in the last 30 days? (Cigarettes, Smokeless Tobacco, Cigars, and/or  Pipes): Yes  Has this patient used any form of tobacco in the last 30 days? (Cigarettes, Smokeless Tobacco, Cigars, and/or Pipes) Yes, A prescription for an FDA-approved tobacco cessation medication was offered at discharge and the patient refused  Blood Alcohol level:  Lab Results  Component Value Date   ETH 316 (H) 11/26/2012   ETH 212 (H) 03/09/2012    Metabolic Disorder Labs:  No results found for: HGBA1C, MPG No results found for: PROLACTIN Lab Results  Component Value Date   CHOL 144 06/14/2017   TRIG 174 (H) 06/14/2017   HDL 62 06/14/2017   CHOLHDL 2.3 06/14/2017   VLDL 35 06/14/2017   LDLCALC 47 06/14/2017    See Psychiatric Specialty Exam and Suicide Risk Assessment completed by Attending Physician prior to discharge.  Discharge destination:  Home  Is patient on multiple antipsychotic therapies at discharge:  No   Has Patient had three or more failed trials of antipsychotic monotherapy by history:  No  Recommended Plan for Multiple Antipsychotic Therapies: NA   Allergies as of 06/19/2017   No Known Allergies     Medication List    STOP taking these medications   haloperidol 5 MG tablet Commonly known as:  HALDOL     TAKE these medications     Indication  albuterol 108 (90 Base) MCG/ACT inhaler Commonly known as:  PROVENTIL HFA;VENTOLIN HFA Inhale 2 puffs into the lungs every 6 (six) hours as needed. :For shortness of breath.  Indication:  SOB   atorvastatin 20 MG tablet Commonly known as:  LIPITOR Take 1 tablet (20 mg total) by mouth daily at 6 PM.  Indication:  High Amount of Triglycerides in the Blood   FLUoxetine 40 MG capsule Commonly known as:  PROZAC Take 1 capsule (40 mg total) by mouth daily. What changed:  medication strength  how much to take  additional instructions  Indication:  Major Depressive Disorder   hydrOXYzine 50 MG tablet Commonly known as:  ATARAX/VISTARIL Take 1 tablet (50 mg total) by mouth every 6 (six) hours as  needed for anxiety.  Indication:  ANXIETY   mometasone-formoterol 200-5 MCG/ACT Aero Commonly known as:  DULERA Inhale 2 puffs into the lungs 2 (two) times daily. For wheezing/shortness of breath.  Indication:  Asthma   nicotine 21 mg/24hr patch Commonly known as:  NICODERM CQ - dosed in mg/24 hours Place 1 patch (21 mg total) onto the skin daily.  Indication:  Nicotine Addiction   oxcarbazepine 600 MG tablet Commonly known as:  TRILEPTAL Take 1 tablet (600 mg total) by mouth 2 (two) times daily. What changed:  additional instructions  Indication:  MOOD STABILIZAION   prazosin 1 MG capsule Commonly known as:  MINIPRESS Take 1 capsule (1 mg total) by mouth at bedtime.  Indication:  nightmares   traZODone 50 MG tablet Commonly known as:  DESYREL Take 1 tablet (50 mg  total) by mouth at bedtime. What changed:  medication strength  how much to take  additional instructions  Indication:  INSOMNIA      Follow-up Information    [facility] Follow up.   Specialty:  Addiction Medicine Why:  Referral made: 06/18/17. If you are interested in pursuing treatment at this facility, please call admissions [facility]) daily to check status of referral/waitlist. Thank you  Contact information: [address] [place] [address] [phone]        [facility] Follow up on 06/25/2017.   Why:  Appt on Thursday, Aug 2nd at 8:30AM for hospital follow-up with Griffin Dakin. Thank you. Contact information: [address] [place], [place] [address] Phone: [phone] Fax: [phone]          Follow-up recommendations:  Follow up with your outpatient provided for any medical issues. Activity & diet as recommended by your primary care provider.  Comments:  Patient is instructed prior to discharge to: Take all medications as prescribed by his/her mental healthcare provider. Report any adverse effects and or reactions  from the medicines to his/her outpatient provider promptly. Patient has been instructed & cautioned: To not engage in alcohol and or illegal drug use while on prescription medicines. In the event of worsening symptoms, patient is instructed to call the crisis hotline, 911 and or go to the nearest ED for appropriate evaluation and treatment of symptoms. To follow-up with his/her primary care provider for your other medical issues, concerns and or health care needs.  Signed: Denzil Magnuson, NP 06/19/2017, 10:47 AM   Patient seen face to face for this psych evaluation, chart reviewed and case discussed with physician extender and formulated safe disposition and aftercare plan. Reviewed the information documented and agree with the treatment plan.  Lachlan Mckim 06/21/2017 1:28 PM

## 2017-06-19 NOTE — Progress Notes (Signed)
Nursing Discharge Note 06/19/2017 0700-1030  Data Reports sleeping well, did not complete self-inventory sheet.   Denies HI, SI, AVH.  Affect blunted but appropriate  Received discharge orders.  Action Spoke with patient 1:1, nurse offered support to patient throughout shift. .  Reviewed medications, discharge instructions, and follow up appointments with patien. Medication scripts reviewed and given to patient.  Paperwork, AVS, SRA, and transition record handed to patient.   Escorted off of unit at 1030. Belongings returned per belongings form.  Discharged to lobby with ticket [place] [facility] bus and [place] bus per SW.    Response Verbalized understanding of discharge teaching. Agrees to contact someone or 911 with thoughts/intent to harm self or others.    To follow up per AVS.

## 2017-06-19 NOTE — Tx Team (Signed)
Interdisciplinary Treatment and Diagnostic Plan Update  06/19/2017 Time of Session: 0830AM Jason Bean MRN: [id number]  Principal Diagnosis: MDD, recurrent, severe, without psychotic features  Secondary Diagnoses: Active Problems:   Severe recurrent major depression without psychotic features (HCC)   Current Medications:  Current Facility-Administered Medications  Medication Dose Route Frequency Provider Last Rate Last Dose  . acetaminophen (TYLENOL) tablet 650 mg  650 mg Oral Q6H PRN Nira ConnBerry, Jason A, NP   650 mg at 06/18/17 1818  . albuterol (PROVENTIL HFA;VENTOLIN HFA) 108 (90 Base) MCG/ACT inhaler 2 puff  2 puff Inhalation Q6H PRN Nira ConnBerry, Jason A, NP   2 puff at 06/15/17 0325  . alum & mag hydroxide-simeth (MAALOX/MYLANTA) 200-200-20 MG/5ML suspension 30 mL  30 mL Oral Q4H PRN Nira ConnBerry, Jason A, NP      . atorvastatin (LIPITOR) tablet 20 mg  20 mg Oral q1800 [name], [name], NP   20 mg at 06/18/17 1817  . azithromycin (ZITHROMAX) tablet 250 mg  250 mg Oral Daily Georgiann CockerIzediuno, [name], MD   250 mg at 06/18/17 [id number]  . FLUoxetine (PROZAC) capsule 40 mg  40 mg Oral Daily [name], [name], MD   40 mg at 06/18/17 0828  . hydrOXYzine (ATARAX/VISTARIL) tablet 50 mg  50 mg Oral Q6H PRN Kerry HoughSimon, Spencer E, PA-C   50 mg at 06/18/17 1606  . magnesium hydroxide (MILK OF MAGNESIA) suspension 30 mL  30 mL Oral Daily PRN Nira ConnBerry, Jason A, NP      . multivitamin with minerals tablet 1 tablet  1 tablet Oral Daily Nira ConnBerry, Jason A, NP   1 tablet at 06/18/17 0828  . Oxcarbazepine (TRILEPTAL) tablet 600 mg  600 mg Oral BID [name], [name], MD   600 mg at 06/18/17 1606  . prazosin (MINIPRESS) capsule 1 mg  1 mg Oral QHS [name], [name], NP   1 mg at 06/18/17 2120  . thiamine (VITAMIN B-1) tablet 100 mg  100 mg Oral Daily Nira ConnBerry, Jason A, NP   100 mg at 06/18/17 [id number]  . traZODone (DESYREL) tablet 50 mg  50 mg Oral QHS [name], [name], MD   50 mg at 06/18/17 2120   PTA Medications: Prescriptions Prior to  Admission  Medication Sig Dispense Refill Last Dose  . albuterol (PROVENTIL HFA;VENTOLIN HFA) 108 (90 BASE) MCG/ACT inhaler Inhale 2 puffs into the lungs every 6 (six) hours as needed. :For shortness of breath.     Marland Kitchen. FLUoxetine (PROZAC) 20 MG capsule Take 1 capsule (20 mg total) by mouth daily. :For depression 30 capsule 0   . haloperidol (HALDOL) 5 MG tablet Take 1 tablet (5 mg total) by mouth 2 (two) times daily. For mood control 60 tablet 0   . mometasone-formoterol (DULERA) 200-5 MCG/ACT AERO Inhale 2 puffs into the lungs 2 (two) times daily. For wheezing/shortness of breath. 1 Inhaler 3   . oxcarbazepine (TRILEPTAL) 600 MG tablet Take 1 tablet (600 mg total) by mouth 2 (two) times daily. :For mood stabilization 60 tablet 0   . traZODone (DESYREL) 150 MG tablet Take 1 tablet (150 mg total) by mouth at bedtime. For sleep 30 tablet 0     Patient Stressors: Substance abuse  Patient Strengths: Ability for insight Barrister's clerkCommunication skills Motivation for treatment/growth  Treatment Modalities: Medication Management, Group therapy, Case management,  1 to 1 session with clinician, Psychoeducation, Recreational therapy.   Physician Treatment Plan for Primary Diagnosis: MDD, recurrent, severe, without psychotic features Long Term Goal(s): Improvement in symptoms so as ready for  discharge Improvement in symptoms so as ready for discharge   Short Term Goals: Ability to identify changes in lifestyle to reduce recurrence of condition will improve Ability to verbalize feelings will improve Ability to disclose and discuss suicidal ideas Ability to demonstrate self-control will improve Ability to identify and develop effective coping behaviors will improve Ability to maintain clinical measurements within normal limits will improve Compliance with prescribed medications will improve Ability to identify triggers associated with substance abuse/mental health issues will improve Ability to identify  changes in lifestyle to reduce recurrence of condition will improve Ability to verbalize feelings will improve Ability to disclose and discuss suicidal ideas Ability to demonstrate self-control will improve Ability to identify and develop effective coping behaviors will improve Ability to maintain clinical measurements within normal limits will improve Compliance with prescribed medications will improve Ability to identify triggers associated with substance abuse/mental health issues will improve  Medication Management: Evaluate patient's response, side effects, and tolerance of medication regimen.  Therapeutic Interventions: 1 to 1 sessions, Unit Group sessions and Medication administration.  Evaluation of Outcomes: Progressing   Physician Treatment Plan for Secondary Diagnosis: Active Problems:   Severe recurrent major depression without psychotic features (HCC)  Long Term Goal(s): Improvement in symptoms so as ready for discharge Improvement in symptoms so as ready for discharge   Short Term Goals: Ability to identify changes in lifestyle to reduce recurrence of condition will improve Ability to verbalize feelings will improve Ability to disclose and discuss suicidal ideas Ability to demonstrate self-control will improve Ability to identify and develop effective coping behaviors will improve Ability to maintain clinical measurements within normal limits will improve Compliance with prescribed medications will improve Ability to identify triggers associated with substance abuse/mental health issues will improve Ability to identify changes in lifestyle to reduce recurrence of condition will improve Ability to verbalize feelings will improve Ability to disclose and discuss suicidal ideas Ability to demonstrate self-control will improve Ability to identify and develop effective coping behaviors will improve Ability to maintain clinical measurements within normal limits will  improve Compliance with prescribed medications will improve Ability to identify triggers associated with substance abuse/mental health issues will improve     Medication Management: Evaluate patient's response, side effects, and tolerance of medication regimen.  Therapeutic Interventions: 1 to 1 sessions, Unit Group sessions and Medication administration.  Evaluation of Outcomes: Progressing    RN Treatment Plan for Primary Diagnosis: MDD, recurrent, severe, without psychotic features Long Term Goal(s): Knowledge of disease and therapeutic regimen to maintain health will improve  Short Term Goals: Ability to remain free from injury will improve, Ability to verbalize feelings will improve and Ability to disclose and discuss suicidal ideas  Medication Management: RN will administer medications as ordered by provider, will assess and evaluate patient's response and provide education to patient for prescribed medication. RN will report any adverse and/or side effects to prescribing provider.  Therapeutic Interventions: 1 on 1 counseling sessions, Psychoeducation, Medication administration, Evaluate responses to treatment, Monitor vital signs and CBGs as ordered, Perform/monitor CIWA, COWS, AIMS and Fall Risk screenings as ordered, Perform wound care treatments as ordered.  Evaluation of Outcomes: Progressing    LCSW Treatment Plan for Primary Diagnosis: MDD, recurrent, severe, without psychotic features Long Term Goal(s): Safe transition to appropriate next level of care at discharge, Engage patient in therapeutic group addressing interpersonal concerns.  Short Term Goals: Engage patient in aftercare planning with referrals and resources, Facilitate patient progression through stages of change regarding substance use diagnoses  and concerns and Identify triggers associated with mental health/substance abuse issues  Therapeutic Interventions: Assess for all discharge needs, 1 to 1 time with  Social worker, Explore available resources and support systems, Assess for adequacy in community support network, Educate family and significant other(s) on suicide prevention, Complete Psychosocial Assessment, Interpersonal group therapy.  Evaluation of Outcomes: Progressing    Progress in Treatment: Attending groups: Intermittently  Participating in groups: Yes, when he attends  Taking medication as prescribed: Yes. Toleration medication: Yes. Family/Significant other contact made: SPE completed with pt; contact attempts made with pt's friend.  Patient understands diagnosis: Yes. Discussing patient identified problems/goals with staff: Yes. Medical problems stabilized or resolved: No. Denies suicidal/homicidal ideation: Yes. Issues/concerns per patient self-inventory: No. Other: n/a  New problem(s) identified: No, Describe:  n/a  New Short Term/Long Term Goal(s): detox, medication management for mood stabilization; and development of comprehensive mental wellness/sobriety plan.   Discharge Plan or Barriers: [facility] after his relapse. Pt focused on trying to get into an apartment and has been provided with multiple resources for low income housing in [place]/boarding rooms. Pt plans to follow-up with his PCP/[facility]. [facility] referral also made.  06/19/17: Pt is going to [facility] at discharge and plans to rent a room when he gets paid next week. Follow-up made with pt's PCP, he did not want referral to psychiatrist for therapist. [facility] referral still in review. Pt encouraged to call daily to check status   Reason for Continuation of Hospitalization: none  Estimated Length of Stay: Today, 06/19/17  Attendees: Patient: 06/19/2017 9:15 AM  Physician: Dr. Jama Flavorsobos MD 06/19/2017 9:15 AM  Nursing: Stacy GardnerPatty, Dan RN 06/19/2017 9:15 AM  RN Care Manager: Quenton FetterJennnifer Clark CM 06/19/2017 9:15 AM  Social Worker: Chartered loss adjusterHeather Smart, LCSW  06/19/2017 9:15 AM  Recreational Therapist: x 06/19/2017 9:15 AM  Other: Hillery Jacksanika Lewis NP;Armandina StammerAgnes Nwoko NP; Feliz Beamravis Money NP; Denzil MagnusonLashunda Thomas NP 06/19/2017 9:15 AM  Other:  06/19/2017 9:15 AM  Other: 06/19/2017 9:15 AM    Scribe for Treatment Team: Ledell PeoplesHeather N Smart, LCSW 06/19/2017 9:15 AM

## 2017-06-19 NOTE — Plan of Care (Signed)
Problem: Safety: Goal: Periods of time without injury will increase Outcome: Progressing Pt has not harmed self or others tonight.  He denies SI/HI and verbally contracts for safety.   

## 2017-06-30 LAB — HEMOGLOBIN A1C
HEMOGLOBIN A1C: 5.9 % — AB (ref 4.8–5.6)
Mean Plasma Glucose: 123 mg/dL

## 2017-09-02 DIAGNOSIS — J449 Chronic obstructive pulmonary disease, unspecified: Secondary | ICD-10-CM | POA: Diagnosis not present

## 2017-09-02 DIAGNOSIS — I10 Essential (primary) hypertension: Secondary | ICD-10-CM | POA: Diagnosis not present

## 2017-09-02 DIAGNOSIS — Z23 Encounter for immunization: Secondary | ICD-10-CM | POA: Diagnosis not present

## 2017-09-07 DIAGNOSIS — J301 Allergic rhinitis due to pollen: Secondary | ICD-10-CM | POA: Diagnosis not present

## 2017-09-07 DIAGNOSIS — S39012A Strain of muscle, fascia and tendon of lower back, initial encounter: Secondary | ICD-10-CM | POA: Diagnosis not present

## 2017-09-07 DIAGNOSIS — G2581 Restless legs syndrome: Secondary | ICD-10-CM | POA: Diagnosis not present

## 2017-09-07 DIAGNOSIS — M79605 Pain in left leg: Secondary | ICD-10-CM | POA: Diagnosis not present

## 2017-09-07 DIAGNOSIS — R109 Unspecified abdominal pain: Secondary | ICD-10-CM | POA: Diagnosis not present

## 2017-09-07 DIAGNOSIS — J4530 Mild persistent asthma, uncomplicated: Secondary | ICD-10-CM | POA: Diagnosis not present

## 2017-10-05 DIAGNOSIS — J301 Allergic rhinitis due to pollen: Secondary | ICD-10-CM | POA: Diagnosis not present

## 2017-10-05 DIAGNOSIS — R454 Irritability and anger: Secondary | ICD-10-CM | POA: Diagnosis not present

## 2017-10-05 DIAGNOSIS — J449 Chronic obstructive pulmonary disease, unspecified: Secondary | ICD-10-CM | POA: Diagnosis not present

## 2017-10-05 DIAGNOSIS — I10 Essential (primary) hypertension: Secondary | ICD-10-CM | POA: Diagnosis not present

## 2017-10-17 DIAGNOSIS — Z720 Tobacco use: Secondary | ICD-10-CM | POA: Diagnosis not present

## 2017-10-17 DIAGNOSIS — E785 Hyperlipidemia, unspecified: Secondary | ICD-10-CM | POA: Diagnosis not present

## 2017-10-17 DIAGNOSIS — F10239 Alcohol dependence with withdrawal, unspecified: Secondary | ICD-10-CM | POA: Diagnosis not present

## 2017-10-17 DIAGNOSIS — I10 Essential (primary) hypertension: Secondary | ICD-10-CM | POA: Diagnosis not present

## 2017-10-17 DIAGNOSIS — J449 Chronic obstructive pulmonary disease, unspecified: Secondary | ICD-10-CM | POA: Diagnosis not present

## 2017-10-17 DIAGNOSIS — E860 Dehydration: Secondary | ICD-10-CM | POA: Diagnosis not present

## 2017-10-17 DIAGNOSIS — Z79899 Other long term (current) drug therapy: Secondary | ICD-10-CM | POA: Diagnosis not present

## 2017-10-17 DIAGNOSIS — Z23 Encounter for immunization: Secondary | ICD-10-CM | POA: Diagnosis not present

## 2017-10-17 DIAGNOSIS — G2581 Restless legs syndrome: Secondary | ICD-10-CM | POA: Diagnosis not present

## 2017-10-17 DIAGNOSIS — R079 Chest pain, unspecified: Secondary | ICD-10-CM | POA: Diagnosis not present

## 2017-10-17 DIAGNOSIS — K219 Gastro-esophageal reflux disease without esophagitis: Secondary | ICD-10-CM | POA: Diagnosis not present

## 2017-10-17 DIAGNOSIS — R000 Tachycardia, unspecified: Secondary | ICD-10-CM | POA: Diagnosis not present

## 2017-10-18 DIAGNOSIS — G2581 Restless legs syndrome: Secondary | ICD-10-CM

## 2017-10-18 DIAGNOSIS — E785 Hyperlipidemia, unspecified: Secondary | ICD-10-CM | POA: Diagnosis not present

## 2017-10-18 DIAGNOSIS — Z720 Tobacco use: Secondary | ICD-10-CM | POA: Diagnosis not present

## 2017-10-18 DIAGNOSIS — E860 Dehydration: Secondary | ICD-10-CM

## 2017-10-18 DIAGNOSIS — F10239 Alcohol dependence with withdrawal, unspecified: Secondary | ICD-10-CM | POA: Diagnosis not present

## 2017-10-19 DIAGNOSIS — E785 Hyperlipidemia, unspecified: Secondary | ICD-10-CM | POA: Diagnosis not present

## 2017-10-19 DIAGNOSIS — Z79899 Other long term (current) drug therapy: Secondary | ICD-10-CM | POA: Diagnosis not present

## 2017-10-19 DIAGNOSIS — Z23 Encounter for immunization: Secondary | ICD-10-CM | POA: Diagnosis not present

## 2017-10-19 DIAGNOSIS — I10 Essential (primary) hypertension: Secondary | ICD-10-CM | POA: Diagnosis not present

## 2017-10-19 DIAGNOSIS — G2581 Restless legs syndrome: Secondary | ICD-10-CM | POA: Diagnosis not present

## 2017-10-19 DIAGNOSIS — K219 Gastro-esophageal reflux disease without esophagitis: Secondary | ICD-10-CM | POA: Diagnosis not present

## 2017-10-19 DIAGNOSIS — E860 Dehydration: Secondary | ICD-10-CM | POA: Diagnosis not present

## 2017-10-19 DIAGNOSIS — J449 Chronic obstructive pulmonary disease, unspecified: Secondary | ICD-10-CM | POA: Diagnosis not present

## 2017-10-26 DIAGNOSIS — R5383 Other fatigue: Secondary | ICD-10-CM | POA: Diagnosis not present

## 2017-10-26 DIAGNOSIS — J4530 Mild persistent asthma, uncomplicated: Secondary | ICD-10-CM | POA: Diagnosis not present

## 2017-10-26 DIAGNOSIS — J301 Allergic rhinitis due to pollen: Secondary | ICD-10-CM | POA: Diagnosis not present

## 2017-10-30 DIAGNOSIS — I10 Essential (primary) hypertension: Secondary | ICD-10-CM | POA: Diagnosis not present

## 2017-10-30 DIAGNOSIS — Z7289 Other problems related to lifestyle: Secondary | ICD-10-CM | POA: Diagnosis not present

## 2017-10-30 DIAGNOSIS — J449 Chronic obstructive pulmonary disease, unspecified: Secondary | ICD-10-CM | POA: Diagnosis not present

## 2017-10-30 DIAGNOSIS — R0602 Shortness of breath: Secondary | ICD-10-CM | POA: Diagnosis not present

## 2017-11-11 DIAGNOSIS — I10 Essential (primary) hypertension: Secondary | ICD-10-CM | POA: Diagnosis not present

## 2017-11-11 DIAGNOSIS — J310 Chronic rhinitis: Secondary | ICD-10-CM | POA: Diagnosis not present

## 2017-11-11 DIAGNOSIS — J45909 Unspecified asthma, uncomplicated: Secondary | ICD-10-CM | POA: Diagnosis not present

## 2017-11-11 DIAGNOSIS — R0602 Shortness of breath: Secondary | ICD-10-CM | POA: Diagnosis not present

## 2017-11-11 DIAGNOSIS — T424X4A Poisoning by benzodiazepines, undetermined, initial encounter: Secondary | ICD-10-CM | POA: Diagnosis not present

## 2017-11-12 ENCOUNTER — Other Ambulatory Visit: Payer: Self-pay

## 2017-11-12 ENCOUNTER — Encounter (HOSPITAL_COMMUNITY): Payer: Self-pay | Admitting: *Deleted

## 2017-11-12 ENCOUNTER — Inpatient Hospital Stay (HOSPITAL_COMMUNITY)
Admission: AD | Admit: 2017-11-12 | Discharge: 2017-11-16 | DRG: 897 | Disposition: A | Discharge status: Home / Self Care | Payer: Medicare Other | Source: Intra-hospital | Attending: Psychiatry | Admitting: Psychiatry

## 2017-11-12 DIAGNOSIS — T424X4A Poisoning by benzodiazepines, undetermined, initial encounter: Secondary | ICD-10-CM | POA: Diagnosis not present

## 2017-11-12 DIAGNOSIS — F10129 Alcohol abuse with intoxication, unspecified: Secondary | ICD-10-CM | POA: Diagnosis present

## 2017-11-12 DIAGNOSIS — G40909 Epilepsy, unspecified, not intractable, without status epilepticus: Secondary | ICD-10-CM | POA: Diagnosis present

## 2017-11-12 DIAGNOSIS — F332 Major depressive disorder, recurrent severe without psychotic features: Secondary | ICD-10-CM | POA: Diagnosis present

## 2017-11-12 DIAGNOSIS — K219 Gastro-esophageal reflux disease without esophagitis: Secondary | ICD-10-CM | POA: Diagnosis present

## 2017-11-12 DIAGNOSIS — F10230 Alcohol dependence with withdrawal, uncomplicated: Secondary | ICD-10-CM | POA: Diagnosis not present

## 2017-11-12 DIAGNOSIS — R45851 Suicidal ideations: Secondary | ICD-10-CM | POA: Diagnosis present

## 2017-11-12 DIAGNOSIS — Z590 Homelessness: Secondary | ICD-10-CM | POA: Diagnosis not present

## 2017-11-12 DIAGNOSIS — G8929 Other chronic pain: Secondary | ICD-10-CM | POA: Diagnosis present

## 2017-11-12 DIAGNOSIS — I10 Essential (primary) hypertension: Secondary | ICD-10-CM | POA: Diagnosis not present

## 2017-11-12 DIAGNOSIS — J45909 Unspecified asthma, uncomplicated: Secondary | ICD-10-CM | POA: Diagnosis not present

## 2017-11-12 DIAGNOSIS — F17210 Nicotine dependence, cigarettes, uncomplicated: Secondary | ICD-10-CM | POA: Diagnosis present

## 2017-11-12 DIAGNOSIS — J449 Chronic obstructive pulmonary disease, unspecified: Secondary | ICD-10-CM | POA: Diagnosis present

## 2017-11-12 DIAGNOSIS — Z79899 Other long term (current) drug therapy: Secondary | ICD-10-CM

## 2017-11-12 DIAGNOSIS — E785 Hyperlipidemia, unspecified: Secondary | ICD-10-CM | POA: Diagnosis present

## 2017-11-12 DIAGNOSIS — F1020 Alcohol dependence, uncomplicated: Secondary | ICD-10-CM | POA: Diagnosis present

## 2017-11-12 DIAGNOSIS — R0602 Shortness of breath: Secondary | ICD-10-CM | POA: Diagnosis not present

## 2017-11-12 DIAGNOSIS — J310 Chronic rhinitis: Secondary | ICD-10-CM | POA: Diagnosis not present

## 2017-11-12 DIAGNOSIS — F10251 Alcohol dependence with alcohol-induced psychotic disorder with hallucinations: Secondary | ICD-10-CM | POA: Diagnosis not present

## 2017-11-12 MED ORDER — LORAZEPAM 1 MG PO TABS
1.0000 mg | ORAL_TABLET | Freq: Four times a day (QID) | ORAL | Status: DC | PRN
  Administered 2017-11-13 – 2017-11-15 (×4): 1 mg via ORAL
  Filled 2017-11-12 (×4): qty 1

## 2017-11-12 MED ORDER — LOPERAMIDE HCL 2 MG PO CAPS
2.0000 mg | ORAL_CAPSULE | ORAL | Status: AC | PRN
  Administered 2017-11-13: 4 mg via ORAL
  Administered 2017-11-13 – 2017-11-15 (×4): 2 mg via ORAL
  Filled 2017-11-12: qty 1
  Filled 2017-11-12: qty 2
  Filled 2017-11-12 (×3): qty 1

## 2017-11-12 MED ORDER — ACETAMINOPHEN 325 MG PO TABS
650.0000 mg | ORAL_TABLET | Freq: Four times a day (QID) | ORAL | Status: DC | PRN
  Administered 2017-11-13 – 2017-11-16 (×7): 650 mg via ORAL
  Filled 2017-11-12 (×7): qty 2

## 2017-11-12 MED ORDER — OXCARBAZEPINE 300 MG PO TABS
300.0000 mg | ORAL_TABLET | Freq: Two times a day (BID) | ORAL | Status: DC
  Administered 2017-11-12 – 2017-11-16 (×8): 300 mg via ORAL
  Filled 2017-11-12 (×11): qty 1

## 2017-11-12 MED ORDER — LORAZEPAM 1 MG PO TABS
1.0000 mg | ORAL_TABLET | Freq: Four times a day (QID) | ORAL | Status: AC
  Administered 2017-11-12 – 2017-11-14 (×6): 1 mg via ORAL
  Filled 2017-11-12 (×6): qty 1

## 2017-11-12 MED ORDER — LORAZEPAM 1 MG PO TABS
1.0000 mg | ORAL_TABLET | Freq: Three times a day (TID) | ORAL | Status: AC
  Administered 2017-11-14 – 2017-11-15 (×3): 1 mg via ORAL
  Filled 2017-11-12 (×3): qty 1

## 2017-11-12 MED ORDER — LORAZEPAM 1 MG PO TABS: 1.0000 mg | ORAL_TABLET | Freq: Every day | ORAL | Status: DC

## 2017-11-12 MED ORDER — FLUOXETINE HCL 20 MG PO CAPS
20.0000 mg | ORAL_CAPSULE | Freq: Every day | ORAL | Status: DC
  Administered 2017-11-13 – 2017-11-16 (×4): 20 mg via ORAL
  Filled 2017-11-12 (×5): qty 1

## 2017-11-12 MED ORDER — ALUM & MAG HYDROXIDE-SIMETH 200-200-20 MG/5ML PO SUSP: 30.0000 mL | ORAL | Status: DC | PRN

## 2017-11-12 MED ORDER — TRAZODONE HCL 50 MG PO TABS
50.0000 mg | ORAL_TABLET | Freq: Every evening | ORAL | Status: DC | PRN
  Administered 2017-11-12: 50 mg via ORAL
  Filled 2017-11-12 (×2): qty 1

## 2017-11-12 MED ORDER — NICOTINE 21 MG/24HR TD PT24
21.0000 mg | MEDICATED_PATCH | Freq: Every day | TRANSDERMAL | Status: DC
  Administered 2017-11-13 – 2017-11-16 (×4): 21 mg via TRANSDERMAL
  Filled 2017-11-12 (×5): qty 1

## 2017-11-12 MED ORDER — ONDANSETRON 4 MG PO TBDP
4.0000 mg | ORAL_TABLET | Freq: Four times a day (QID) | ORAL | Status: DC | PRN
  Administered 2017-11-13 – 2017-11-14 (×4): 4 mg via ORAL
  Filled 2017-11-12 (×4): qty 1

## 2017-11-12 MED ORDER — PRAZOSIN HCL 1 MG PO CAPS
1.0000 mg | ORAL_CAPSULE | Freq: Every day | ORAL | Status: DC
  Administered 2017-11-12 – 2017-11-15 (×4): 1 mg via ORAL
  Filled 2017-11-12 (×6): qty 1

## 2017-11-12 MED ORDER — NICOTINE 21 MG/24HR TD PT24
21.0000 mg | MEDICATED_PATCH | Freq: Every day | TRANSDERMAL | Status: DC
  Filled 2017-11-12 (×2): qty 1

## 2017-11-12 MED ORDER — LORAZEPAM 1 MG PO TABS
1.0000 mg | ORAL_TABLET | Freq: Two times a day (BID) | ORAL | Status: AC
  Administered 2017-11-15 – 2017-11-16 (×2): 1 mg via ORAL
  Filled 2017-11-12 (×2): qty 1

## 2017-11-12 MED ORDER — HYDROXYZINE HCL 25 MG PO TABS
25.0000 mg | ORAL_TABLET | Freq: Four times a day (QID) | ORAL | Status: DC | PRN
  Administered 2017-11-12 – 2017-11-15 (×6): 25 mg via ORAL
  Filled 2017-11-12 (×6): qty 1

## 2017-11-12 MED ORDER — ATORVASTATIN CALCIUM 20 MG PO TABS
20.0000 mg | ORAL_TABLET | Freq: Every day | ORAL | Status: DC
  Administered 2017-11-13 – 2017-11-15 (×3): 20 mg via ORAL
  Filled 2017-11-12 (×4): qty 1

## 2017-11-12 MED ORDER — MAGNESIUM HYDROXIDE 400 MG/5ML PO SUSP: 30.0000 mL | Freq: Every day | ORAL | Status: DC | PRN

## 2017-11-12 NOTE — Tx Team (Signed)
Initial Treatment Plan 11/12/2017 7:33 PM Jason Bean ZOX:[id number]    PATIENT STRESSORS: Health problems Substance abuse   PATIENT STRENGTHS: Ability for insight Average or above average intelligence Capable of independent living General fund of knowledge   PATIENT IDENTIFIED PROBLEMS: Depression Suicidal thoughts ETOH abuse "Get the alcohol out of my system" "Get my thinking back clear"                     DISCHARGE CRITERIA:  Ability to meet basic life and health needs Improved stabilization in mood, thinking, and/or behavior Verbal commitment to aftercare and medication compliance Withdrawal symptoms are absent or subacute and managed without 24-hour nursing intervention  PRELIMINARY DISCHARGE PLAN: Attend aftercare/continuing care group  PATIENT/FAMILY INVOLVEMENT: This treatment plan has been presented to and reviewed with the patient, Jason Bean, and/or family member, .  The patient and family have been given the opportunity to ask questions and make suggestions.  Peityn Payton, [place], [place]RN 11/12/2017, 7:33 PM

## 2017-11-12 NOTE — Progress Notes (Signed)
Jason Bean is a 56 year old male pt [facility]. On admission, he endorses suicidal thoughts but denies any plan and is able to contract for safety while in the hospital. He reports that he has been drinking daily and reports last usage was yesterday. He is currently tremulous and slightly diaphoretic on admission. He reports that he is taking his medications as prescribed and reports that he does not use any illegal drugs. He reports that he gets a disability check and reports that he has some place to go when he gets discharged but is unsure if he wants to go back there and reports that it is not the best place for him to be. He does have history of seizures but unable to recall the last time he had one. Jason Bean was oriented to the unit and safety maintained.

## 2017-11-12 NOTE — BH Assessment (Signed)
Tele Assessment Note   Patient Name: Jason Bean MRN: [id number] Referring Physician: Dr. Larene Pickett at [facility] Location of Patient: [facility] ED Location of Provider: [place] is an 56 y.o. male.  Patient was brought to [facility] after he had attempted to overdose on Xanax.  Pt sates he "took a few."  Pt reports previous SI attempts.  Pt denies previous inpatient treatment.  Pt denies current outpateint tx.  Pt states he is on disability for mental health.  Pt states he has an dx of schizophrenia and bipolar d/o.  Pt states he stopped taking his psychiatric meds.  He cannot recall the name of his meds.  He says his PCP prescribes his medications.  Pt had called 911 after the overdose.  Pt states "want to get out of this world" and thought an overdose would accomplish this.  Pt also admits to drinking ETOH over the last several days and indicated he may have smoked some heroin recently.  Diagnosis: F20.9 Schizophrenia; F10.20 ETOH use d/o severe  Past Medical History:  Past Medical History:  Diagnosis Date  . Arthritis   . COPD (chronic obstructive pulmonary disease) ([place])   . Depression   . Hyperlipidemia   . Hypertension     Past Surgical History:  Procedure Laterality Date  . BACK SURGERY    . NECK SURGERY      Family History: History reviewed. No pertinent family history.  Social History:  reports that he has been smoking cigarettes.  He has a 30.00 pack-year smoking history. he has never used smokeless tobacco. He reports that he drinks alcohol. He reports that he does not use drugs.  Additional Social History:  Alcohol / Drug Use Pain Medications: N/A Prescriptions: Atorvatatin Calcium, Fluticasone/Vilanterol, Montelucast Sodium, Albuterol, Alprazolam, Flonase, Robaxin 681, Requip, Folic Acid, Thiamine Over the Counter: Unknown History of alcohol / drug use?: Yes Substance #1 Name of Substance 1:  ETOH 1 - Age of First Use: Unknown 1 - Amount (size/oz): Varies 1 - Frequency: Daily over the lst several days 1 - Duration: unknown 1 - Last Use / Amount: 12/20  CIWA: CIWA-Ar BP: 110/87 Pulse Rate: 84 Nausea and Vomiting: no nausea and no vomiting Tactile Disturbances: none Tremor: three Auditory Disturbances: not present Paroxysmal Sweats: barely perceptible sweating, palms moist Visual Disturbances: very mild sensitivity Anxiety: three Headache, Fullness in Head: very mild Agitation: normal activity Orientation and Clouding of Sensorium: oriented and can do serial additions CIWA-Ar Total: 9 COWS:    PATIENT STRENGTHS: (choose at least two) Ability for insight Average or above average intelligence Capable of independent living Communication skills Motivation for treatment/growth  Allergies: No Known Allergies  Home Medications:  Medications Prior to Admission  Medication Sig Dispense Refill  . albuterol (PROVENTIL HFA;VENTOLIN HFA) 108 (90 BASE) MCG/ACT inhaler Inhale 2 puffs into the lungs every 6 (six) hours as needed. :For shortness of breath.    Marland Kitchen atorvastatin (LIPITOR) 20 MG tablet Take 1 tablet (20 mg total) by mouth daily at 6 PM. 30 tablet 0  . FLUoxetine (PROZAC) 40 MG capsule Take 1 capsule (40 mg total) by mouth daily. 30 capsule 0  . hydrOXYzine (ATARAX/VISTARIL) 50 MG tablet Take 1 tablet (50 mg total) by mouth every 6 (six) hours as needed for anxiety. 30 tablet 0  . mometasone-formoterol (DULERA) 200-5 MCG/ACT AERO Inhale 2 puffs into the lungs 2 (two) times daily. For wheezing/shortness of breath. 1 Inhaler 3  .  nicotine (NICODERM CQ - DOSED IN MG/24 HOURS) 21 mg/24hr patch Place 1 patch (21 mg total) onto the skin daily. 28 patch 0  . Oxcarbazepine (TRILEPTAL) 600 MG tablet Take 1 tablet (600 mg total) by mouth 2 (two) times daily. 60 tablet 0  . prazosin (MINIPRESS) 1 MG capsule Take 1 capsule (1 mg total) by mouth at bedtime. 30 capsule 0  . traZODone  (DESYREL) 50 MG tablet Take 1 tablet (50 mg total) by mouth at bedtime. 30 tablet 0    OB/GYN Status:  No LMP for male patient.  General Assessment Data Location of Assessment: [facility](Pt was at [facility] TTS Assessment: Out of system Is this a Tele or Face-to-Face Assessment?: Tele Assessment Is this an Initial Assessment or a Re-assessment for this encounter?: Initial Assessment Marital status: Single Is patient pregnant?: No Pregnancy Status: No Living Arrangements: Alone Can pt return to current living arrangement?: Yes Admission Status: Voluntary Is patient capable of signing voluntary admission?: Yes Referral Source: Self/Family/Friend Insurance type: MCD     Crisis Care Plan Living Arrangements: Alone Name of Psychiatrist: None Name of Therapist: None  Education Status Is patient currently in school?: No Highest grade of school patient has completed: Unknown  Risk to self with the past 6 months Suicidal Ideation: Yes-Currently Present Has patient been a risk to self within the past 6 months prior to admission? : Yes Suicidal Intent: Yes-Currently Present Has patient had any suicidal intent within the past 6 months prior to admission? : Yes Is patient at risk for suicide?: Yes Suicidal Plan?: Yes-Currently Present Has patient had any suicidal plan within the past 6 months prior to admission? : Yes Specify Current Suicidal Plan: Overdose on xanax Access to Means: Yes Specify Access to Suicidal Means: Medications What has been your use of drugs/alcohol within the last 12 months?: ETOH Previous Attempts/Gestures: Yes How many times?: (Multiple) Other Self Harm Risks: Unknown Triggers for Past Attempts: Unpredictable Intentional Self Injurious Behavior: None Family Suicide History: Unknown Recent stressful life event(s): Turmoil (Comment) Persecutory voices/beliefs?: Yes Depression: Yes Depression Symptoms: Despondent, Loss of interest  in usual pleasures, Feeling worthless/self pity, Insomnia, Isolating Substance abuse history and/or treatment for substance abuse?: Yes Suicide prevention information given to non-admitted patients: Not applicable  Risk to Others within the past 6 months Homicidal Ideation: No Does patient have any lifetime risk of violence toward others beyond the six months prior to admission? : No Thoughts of Harm to Others: No Current Homicidal Intent: No Current Homicidal Plan: No Access to Homicidal Means: No Identified Victim: No one History of harm to others?: No Assessment of Violence: None Noted Violent Behavior Description: None reported Does patient have access to weapons?: No Criminal Charges Pending?: No Does patient have a court date: No Is patient on probation?: No  Psychosis Hallucinations: None noted Delusions: None noted  Mental Status Report Appearance/Hygiene: Unremarkable Eye Contact: Unable to Assess Motor Activity: Unable to assess Speech: Unable to assess Level of Consciousness: Unable to assess Mood: Depressed, Helpless, Sad Affect: Anxious Anxiety Level: Severe Thought Processes: Unable to Assess Judgement: Unable to Assess Orientation: Appropriate for developmental age Obsessive Compulsive Thoughts/Behaviors: Unable to Assess  Cognitive Functioning Concentration: Decreased Memory: Unable to Assess IQ: Average Insight: Unable to Assess Impulse Control: Unable to Assess Appetite: Good Weight Loss: 0 Weight Gain: 0 Sleep: Unable to Assess Total Hours of Sleep: 4 Vegetative Symptoms: Unable to Assess  ADLScreening [facility] Assessment Services) Patient's cognitive ability adequate to safely complete daily  activities?: Yes Patient able to express need for assistance with ADLs?: Yes Independently performs ADLs?: Yes (appropriate for developmental age)  Prior Inpatient Therapy Prior Inpatient Therapy: Yes Prior Therapy Dates: July 2018 Prior Therapy  Facilty/Provider(s): [facility] Reason for Treatment: SI  Prior Outpatient Therapy Prior Outpatient Therapy: Yes Prior Therapy Dates: Unknown Prior Therapy Facilty/Provider(s): N/A Reason for Treatment: N/A Does patient have an ACCT team?: No Does patient have Intensive In-House Services?  : No Does patient have [facility] services? : No Does patient have P4CC services?: No  ADL Screening (condition at time of admission) Patient's cognitive ability adequate to safely complete daily activities?: Yes Is the patient deaf or have difficulty hearing?: No Does the patient have difficulty seeing, even when wearing glasses/contacts?: No Does the patient have difficulty concentrating, remembering, or making decisions?: Yes Patient able to express need for assistance with ADLs?: Yes Does the patient have difficulty dressing or bathing?: No Independently performs ADLs?: Yes (appropriate for developmental age) Does the patient have difficulty walking or climbing stairs?: No Weakness of Legs: None Weakness of Arms/Hands: None  Home Assistive Devices/Equipment Home Assistive Devices/Equipment: Dentures (specify type)(does not have dentures with him on admission)  Therapy Consults (therapy consults require a physician order) PT Evaluation Needed: No OT Evalulation Needed: No SLP Evaluation Needed: No Abuse/Neglect Assessment (Assessment to be complete while patient is alone) Abuse/Neglect Assessment Can Be Completed: Yes Physical Abuse: Denies Verbal Abuse: Denies Sexual Abuse: Denies Exploitation of patient/patient's resources: Denies Self-Neglect: Denies Values / Beliefs Cultural Requests During Hospitalization: None Spiritual Requests During Hospitalization: None Consults Spiritual Care Consult Needed: No Social Work Consult Needed: No Regulatory affairs officer (For Healthcare) Does Patient Have a Medical Advance Directive?: No Would patient like information on creating a medical advance  directive?: No - Patient declined Nutrition Screen- [facility] Adult/WL/AP Patient's home diet: Regular Has the patient recently lost weight without trying?: No Has the patient been eating poorly because of a decreased appetite?: No Malnutrition Screening Tool Score: 0  Additional Information 1:1 In Past 12 Months?: No CIRT Risk: No Elopement Risk: No Does patient have medical clearance?: Yes     Disposition:  Disposition Initial Assessment Completed for this Encounter: Yes Disposition of Patient: Inpatient treatment program, Referred to Type of inpatient treatment program: Adult(Accepted to [facility] 302-1, Shuvon Rankin to Dr. Parke Poisson.)  This service was provided via telemedicine using a 2-way, interactive audio and Radiographer, therapeutic.  Names of all persons participating in this telemedicine service and their role in this encounter. Name:  Role:   Name:  Role:   Name:  Role:   Name:  Role:     Raymondo Band 11/12/2017 7:55 PM

## 2017-11-12 NOTE — Progress Notes (Signed)
D   Pt started requesting medications shortly after he arrived on the unit and less than an hour after he received his bedtime medications he came back asking for more medication   Pt interacts minimally with others   He has a blunted affect A   Verbal support given   Medications administered and effectiveness monitored  Q 15 min checks R   Pt is safe at present time

## 2017-11-12 NOTE — Progress Notes (Signed)
Psychoeducational Group Note  Date:  11/12/2017 Time:  2045  Group Topic/Focus:  wrap up group  Participation Level: Did Not Attend  Participation Quality:  Not Applicable  Affect:  Not Applicable  Cognitive:  Not Applicable  Insight:  Not Applicable  Engagement in Group: Not Applicable  Additional Comments: Pt was newly admitted to unit and took a shower and changed clothes.   Johann CapersMcNeil, Kalmen Lollar S 11/12/2017, 10:08 PM

## 2017-11-13 DIAGNOSIS — F332 Major depressive disorder, recurrent severe without psychotic features: Secondary | ICD-10-CM

## 2017-11-13 DIAGNOSIS — I10 Essential (primary) hypertension: Secondary | ICD-10-CM

## 2017-11-13 DIAGNOSIS — G8929 Other chronic pain: Secondary | ICD-10-CM

## 2017-11-13 DIAGNOSIS — F17210 Nicotine dependence, cigarettes, uncomplicated: Secondary | ICD-10-CM

## 2017-11-13 DIAGNOSIS — J449 Chronic obstructive pulmonary disease, unspecified: Secondary | ICD-10-CM

## 2017-11-13 DIAGNOSIS — F10251 Alcohol dependence with alcohol-induced psychotic disorder with hallucinations: Secondary | ICD-10-CM

## 2017-11-13 DIAGNOSIS — E785 Hyperlipidemia, unspecified: Secondary | ICD-10-CM

## 2017-11-13 DIAGNOSIS — K219 Gastro-esophageal reflux disease without esophagitis: Secondary | ICD-10-CM

## 2017-11-13 LAB — TSH: TSH: 1.973 u[IU]/mL (ref 0.350–4.500)

## 2017-11-13 LAB — HEMOGLOBIN A1C
Hgb A1c MFr Bld: 5.8 % — ABNORMAL HIGH (ref 4.8–5.6)
Mean Plasma Glucose: 119.76 mg/dL

## 2017-11-13 LAB — LIPID PANEL
CHOL/HDL RATIO: 1.8 ratio
Cholesterol: 146 mg/dL (ref 0–200)
HDL: 82 mg/dL (ref >40)
LDL Cholesterol: 17 mg/dL (ref 0–99)
Triglycerides: 237 mg/dL — ABNORMAL HIGH (ref <150)
VLDL: 47 mg/dL — AB (ref 0–40)

## 2017-11-13 MED ORDER — TRAZODONE HCL 50 MG PO TABS
50.0000 mg | ORAL_TABLET | Freq: Every evening | ORAL | Status: DC | PRN
  Administered 2017-11-13: 50 mg via ORAL
  Filled 2017-11-13 (×7): qty 1
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
## 2017-11-13 NOTE — Progress Notes (Addendum)
Recreation Therapy Notes  Date: 11/13/17 Time: 0930 Location: 300 [address] Dayroom  Group Topic: Stress Management  Goal Area(s) Addresses:  Patient will verbalize importance of using healthy stress management.  Patient will identify positive emotions associated with healthy stress management.   Behavioral Response: Engaged  Intervention: Stress Management  Activity :  Progressive Muscle Relaxation.  LRT read Bean script to lead patients through the stress management technique of progressive muscle relaxation.  Patients were to follow along to engage in the activity as LRT read the script.  Education:  Stress Management, Discharge Planning.   Education Outcome: Acknowledges edcuation/In group clarification offered/Needs additional education  Clinical Observations/Feedback: Pt attended group.    Jason Bean, LRT/CTRS          Jason Bean 11/13/2017 11:06 AM 

## 2017-11-13 NOTE — Progress Notes (Signed)
D:  Patient denied SI today, contracts for safety.  Denied HI.  Denied visual hallucinations.  Patient stated he does hear voices to hurt himself and others.   A:  Medications administered per MD orders.  Emotional support and encouragement given patient. R:  Safety maintained with 15 minute checks.

## 2017-11-13 NOTE — H&P (Addendum)
Psychiatric Admission Assessment Adult  Patient Identification: Jason Bean MRN:  [id number] Date of Evaluation:  11/13/2017 Chief Complaint:  Suicidal thoughts while intoxicated Principal Diagnosis: AUD Diagnosis:   Patient Active Problem List   Diagnosis Date Noted  . Alcohol abuse with intoxication ([place]) [F10.129]   . Severe recurrent major depression without psychotic features ([place]) [F33.2] 06/13/2017  . Alcohol dependence ([place]) [F10.20] 11/27/2012  . GERD (gastroesophageal reflux disease) [K21.9] 02/12/2012  . Hypertension [I10] 01/06/2012  . COPD (chronic obstructive pulmonary disease) ([place]) [J44.9] 01/06/2012  . Hyperlipidemia [E78.5] 01/06/2012  . Anxiety [F41.9] 01/06/2012  . Neck pain, chronic [M54.2, G89.29] 01/06/2012   History of Present Illness:  56 y.o Caucasian male, single, lives alone, on SSI. Background history of Alcohol Use Disorder and MDD. Transferred from [facility] ER. Presented there intoxicated with alcohol.  He was having auditory of hallucinations. [place] voices telling him to hurt others. He was reported to have overdosed on Xanax.  At interview patient tells me that he was sober until two weeks ago. Says he started hanging out with the wrong people, he stopped attending [place] meetings. Says he lost control and started popping more xanax. Says his PCP prescribes it for him. Patient says he felt suicidal while intoxicated. Tells me that he feels okay now. Denies any lingering suicidal thoughts. Says he wants to detox and go back home. Says he was seeing things and hearing thins before. Says the medications has helped. Currently he denies any sweatiness, no headaches. No retching, nausea or vomiting. No fullness in the head. No visual, tactile or auditory hallucination. No internal restlessness. No suicidal or homicidal thoughts. Patient is optimistic about the future. Says he wants to get back on his medications. No access to weapons.    Total Time spent with  patient: 1 hour  Past Psychiatric History:  Patient has been here before. He was last here in July As per old notes Patient has had multiple inpatient admissions over the years. Says it is usually related to suicidal thoughts/ behavior while under the influence of substances. He had overdosed in the past. He has cut his wrist a couple of times in the past. Has transient psychosis while coming off alcohol. No past history of mania. No past history of violent behavior.  Patient has been treated for depression with Prozac. He takes Amitriptyline for sleep. He is on Trileptal as a mood stabilizer and for treatment of seizures.   Is the patient at risk to self? Yes.    Has the patient been a risk to self in the past 6 months? Yes.    Has the patient been a risk to self within the distant past? Yes.    Is the patient a risk to others? No.  Has the patient been a risk to others in the past 6 months? No.  Has the patient been a risk to others within the distant past? No.   Prior Inpatient Therapy: Prior Inpatient Therapy: Yes Prior Therapy Dates: July 2018 Prior Therapy Facilty/Provider(s): [facility] Reason for Treatment: SI Prior Outpatient Therapy: Prior Outpatient Therapy: Yes Prior Therapy Dates: Unknown Prior Therapy Facilty/Provider(s): N/A Reason for Treatment: N/A Does patient have an ACCT team?: No Does patient have Intensive In-House Services?  : No Does patient have [facility] services? : No Does patient have P4CC services?: No  Alcohol Screening: 1. How often do you have a drink containing alcohol?: 4 or more times a week 2. How many drinks containing alcohol do you have on  a typical day when you are drinking?: 10 or more 3. How often do you have six or more drinks on one occasion?: Daily or almost daily AUDIT-C Score: 12 4. How often during the last year have you found that you were not able to stop drinking once you had started?: Daily or almost daily 5. How often during the last year  have you failed to do what was normally expected from you becasue of drinking?: Daily or almost daily 6. How often during the last year have you needed a first drink in the morning to get yourself going after a heavy drinking session?: Daily or almost daily 7. How often during the last year have you had a feeling of guilt of remorse after drinking?: Daily or almost daily 8. How often during the last year have you been unable to remember what happened the night before because you had been drinking?: Daily or almost daily 9. Have you or someone else been injured as a result of your drinking?: No 10. Has a relative or friend or a doctor or another health worker been concerned about your drinking or suggested you cut down?: Yes, during the last year Alcohol Use Disorder Identification Test Final Score (AUDIT): 36 Intervention/Follow-up: Alcohol Education Substance Abuse History in the last 12 months:  Yes.   Consequences of Substance Abuse: Limited contact with his children and grand children. Has now lost his housing and circle of friends. 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  Previous Psychotropic Medications: Yes  Psychological Evaluations: Yes  Past Medical History:  Past Medical History:  Diagnosis Date  . Arthritis   . COPD (chronic obstructive pulmonary disease) ([place])   . Depression   . Hyperlipidemia   . Hypertension     Past Surgical History:  Procedure Laterality Date  . BACK SURGERY    . NECK SURGERY     Family History: History reviewed. No pertinent family history. Family Psychiatric  History: Family history of addiction. No family history of suicide. No family history of mental illness.  Tobacco Screening: Have you used any form of tobacco in the last 30 days? (Cigarettes, Smokeless Tobacco, Cigars, and/or Pipes): Yes Tobacco use, Select all that apply: 5 or more cigarettes per day Are you interested in Tobacco Cessation Medications?: Yes, will notify MD for an  order Counseled patient on smoking cessation including recognizing danger situations, developing coping skills and basic information about quitting provided: Refused/Declined practical counseling Social History:  Social History   Substance and Sexual Activity  Alcohol Use Yes   Comment: Pt drinks daily . unknown amount     Social History   Substance and Sexual Activity  Drug Use No    Additional Social History: Marital status: Single    Pain Medications: N/A Prescriptions: Atorvatatin Calcium, Fluticasone/Vilanterol, Montelucast Sodium, Albuterol, Alprazolam, Flonase, Robaxin 964, Requip, Folic Acid, Thiamine Over the Counter: Unknown History of alcohol / drug use?: Yes Name of Substance 1: ETOH 1 - Age of First Use: Unknown 1 - Amount (size/oz): Varies 1 - Frequency: Daily over the lst several days 1 - Duration: unknown 1 - Last Use / Amount: 12/20       Not in any relationship. Never married. Has two children and four grand kids. Addiction has limited engagement with his family. No pending legal issues.  Allergies:  No Known Allergies Lab Results:  Results for orders placed or performed during the hospital encounter of 11/12/17 (from the past 48 hour(s))  Hemoglobin A1c  Status: Abnormal   Collection Time: 11/13/17  7:58 AM  Result Value Ref Range   Hgb A1c MFr Bld 5.8 (H) 4.8 - 5.6 %    Comment: (NOTE) Pre diabetes:          5.7%-6.4% Diabetes:              >6.4% Glycemic control for   <7.0% adults with diabetes    Mean Plasma Glucose 119.76 mg/dL    Comment: Performed at [facility] [address] [place], [place] [address]  Lipid panel     Status: Abnormal   Collection Time: 11/13/17  7:58 AM  Result Value Ref Range   Cholesterol 146 0 - 200 mg/dL   Triglycerides 237 (H) <150 mg/dL   HDL 82 >40 mg/dL   Total CHOL/HDL Ratio 1.8 RATIO   VLDL 47 (H) 0 - 40 mg/dL   LDL Cholesterol 17 0 - 99 mg/dL    Comment:        Total Cholesterol/HDL:CHD  Risk Coronary Heart Disease Risk Table                     Men   Women  1/2 Average Risk   3.4   3.3  Average Risk       5.0   4.4  2 X Average Risk   9.6   7.1  3 X Average Risk  23.4   11.0        Use the calculated Patient Ratio above and the CHD Risk Table to determine the patient's CHD Risk.        ATP III CLASSIFICATION (LDL):  <100     mg/dL   Optimal  100-129  mg/dL   Near or Above                    Optimal  130-159  mg/dL   Borderline  160-189  mg/dL   High  >190     mg/dL   Very High Performed at [facility] [address], [place], [place] [address]   TSH     Status: None   Collection Time: 11/13/17  7:58 AM  Result Value Ref Range   TSH 1.973 0.350 - 4.500 uIU/mL    Comment: Performed by a 3rd Generation assay with a functional sensitivity of <=0.01 uIU/mL. Performed at [facility], [place] [address], [place], [place] [address]     Blood Alcohol level:  Lab Results  Component Value Date   ETH 316 (H) 11/26/2012   ETH 212 (H) 37/85/8850    Metabolic Disorder Labs:  Lab Results  Component Value Date   HGBA1C 5.8 (H) 11/13/2017   MPG 119.76 11/13/2017   MPG 123 06/14/2017   No results found for: PROLACTIN Lab Results  Component Value Date   CHOL 146 11/13/2017   TRIG 237 (H) 11/13/2017   HDL 82 11/13/2017   CHOLHDL 1.8 11/13/2017   VLDL 47 (H) 11/13/2017   LDLCALC 17 11/13/2017   LDLCALC 47 06/14/2017    Current Medications: Current Facility-Administered Medications  Medication Dose Route Frequency Provider Last Rate Last Dose  . acetaminophen (TYLENOL) tablet 650 mg  650 mg Oral Q6H PRN Lindon Romp A, NP   650 mg at 11/13/17 1536  . alum & mag hydroxide-simeth (MAALOX/MYLANTA) 200-200-20 MG/5ML suspension 30 mL  30 mL Oral Q4H PRN Lindon Romp A, NP      . atorvastatin (LIPITOR) tablet 20  mg  20 mg Oral q1800 Lindon Romp A, NP      . FLUoxetine (PROZAC) capsule 20 mg  20 mg Oral Daily Lindon Romp A, NP    20 mg at 11/13/17 0754  . hydrOXYzine (ATARAX/VISTARIL) tablet 25 mg  25 mg Oral Q6H PRN Lindon Romp A, NP   25 mg at 11/13/17 1537  . loperamide (IMODIUM) capsule 2-4 mg  2-4 mg Oral PRN Lindon Romp A, NP   2 mg at 11/13/17 1538  . LORazepam (ATIVAN) tablet 1 mg  1 mg Oral Q6H PRN Lindon Romp A, NP   1 mg at 11/13/17 0302  . LORazepam (ATIVAN) tablet 1 mg  1 mg Oral QID Lindon Romp A, NP   1 mg at 11/13/17 1631   Followed by  . [START ON 11/14/2017] LORazepam (ATIVAN) tablet 1 mg  1 mg Oral TID Rozetta Nunnery, NP       Followed by  . [START ON 11/15/2017] LORazepam (ATIVAN) tablet 1 mg  1 mg Oral BID Rozetta Nunnery, NP       Followed by  . [START ON 11/17/2017] LORazepam (ATIVAN) tablet 1 mg  1 mg Oral Daily Lindon Romp A, NP      . magnesium hydroxide (MILK OF MAGNESIA) suspension 30 mL  30 mL Oral Daily PRN Lindon Romp A, NP      . nicotine (NICODERM CQ - dosed in mg/24 hours) patch 21 mg  21 mg Transdermal Daily Cobos, Myer Peer, MD   21 mg at 11/13/17 0754  . ondansetron (ZOFRAN-ODT) disintegrating tablet 4 mg  4 mg Oral Q6H PRN Lindon Romp A, NP   4 mg at 11/13/17 1539  . Oxcarbazepine (TRILEPTAL) tablet 300 mg  300 mg Oral BID Lindon Romp A, NP   300 mg at 11/13/17 1632  . prazosin (MINIPRESS) capsule 1 mg  1 mg Oral QHS Lindon Romp A, NP   1 mg at 11/12/17 2059  . traZODone (DESYREL) tablet 50 mg  50 mg Oral QHS PRN Rozetta Nunnery, NP   50 mg at 11/12/17 2059   PTA Medications: Medications Prior to Admission  Medication Sig Dispense Refill Last Dose  . albuterol (PROVENTIL HFA;VENTOLIN HFA) 108 (90 BASE) MCG/ACT inhaler Inhale 2 puffs into the lungs every 6 (six) hours as needed. :For shortness of breath.   11/12/2017 at Unknown time  . ALPRAZolam (XANAX) 0.5 MG tablet Take 0.5 mg by mouth 3 (three) times daily as needed for anxiety.   Past Week at Unknown time  . atorvastatin (LIPITOR) 20 MG tablet Take 1 tablet (20 mg total) by mouth daily at 6 PM. 30 tablet 0 Past Week at  Unknown time  . mometasone-formoterol (DULERA) 200-5 MCG/ACT AERO Inhale 2 puffs into the lungs 2 (two) times daily. For wheezing/shortness of breath. 1 Inhaler 3 Past Week at Unknown time  . montelukast (SINGULAIR) 10 MG tablet Take 10 mg by mouth at bedtime.   Past Week at Unknown time  . Oxcarbazepine (TRILEPTAL) 600 MG tablet Take 1 tablet (600 mg total) by mouth 2 (two) times daily. 60 tablet 0 Past Week at Unknown time  . risperiDONE (RISPERDAL) 2 MG tablet Take 2 mg by mouth daily.   Past Week at Unknown time  . tiotropium (SPIRIVA) 18 MCG inhalation capsule Place 18 mcg into inhaler and inhale daily.   Past Week at Unknown time  . FLUoxetine (PROZAC) 40 MG capsule Take 1 capsule (40 mg total) by mouth daily. (  Patient not taking: Reported on 11/13/2017) 30 capsule 0 Not Taking at Unknown time  . hydrOXYzine (ATARAX/VISTARIL) 50 MG tablet Take 1 tablet (50 mg total) by mouth every 6 (six) hours as needed for anxiety. (Patient not taking: Reported on 11/13/2017) 30 tablet 0 Not Taking at Unknown time  . nicotine (NICODERM CQ - DOSED IN MG/24 HOURS) 21 mg/24hr patch Place 1 patch (21 mg total) onto the skin daily. (Patient not taking: Reported on 11/13/2017) 28 patch 0 Not Taking at Unknown time  . prazosin (MINIPRESS) 1 MG capsule Take 1 capsule (1 mg total) by mouth at bedtime. (Patient not taking: Reported on 11/13/2017) 30 capsule 0 Not Taking at Unknown time  . traZODone (DESYREL) 50 MG tablet Take 1 tablet (50 mg total) by mouth at bedtime. (Patient not taking: Reported on 11/13/2017) 30 tablet 0 Not Taking at Unknown time    Musculoskeletal: Strength & Muscle Tone: within normal limits Gait & Station: broad based Patient leans: N/A  Psychiatric Specialty Exam: Physical Exam  Constitutional: No distress.  HENT:  Head: Normocephalic.  Respiratory: Effort normal.  Neurological: He is alert.  Skin: He is not diaphoretic.  Psychiatric:  As above    ROS   Blood pressure 128/85,  pulse 88, temperature 97.7 F (36.5 C), temperature source Oral, resp. rate 20, height 5' 9"  (1.753 m), weight 93.9 kg (207 lb).Body mass index is 30.57 kg/m.  General Appearance: Poor grooming, casually dressed, not sweaty, not confused. Normal conjugate eye movements. Not internally distressed. Appropriate behavior.   Eye Contact:  Good  Speech:  Spontaneous. Normal tone and rate  Volume:  Normal  Mood:  Feeling better  Affect:  Restricted but appropriate   Thought Process:  Linear  Orientation:  Full (Time, Place, and Person)  Thought Content:  Rumination on his relapse. No delusional theme. No preoccupation with violent thoughts. No hallucination in any modality.   Suicidal Thoughts:  No current suicidal thoughts  Homicidal Thoughts:  No  Memory:  WNL  Judgement:  Fair  Insight:  Partial   Psychomotor Activity:  Normal  Concentration:  Fair  Recall:  Did not assess  Fund of Knowledge:  Fair  Language:  Good  Akathisia:  NA  Handed:    AIMS (if indicated):     Assets:  Communication Skills Desire for Improvement Social Support  ADL's:  Impaired  Cognition:  WNL  Sleep:  Number of Hours: 3.75    Treatment Plan Summary: Patient is coming off alcohol. No complications so far. Limited motivation to get into rehab. We plan to detxo and continue his home medications.   Psychiatric: MDD recurrent AUD  Medical: Seizure disorder HTN HLD GERD Chronic pain COPD  Psychosocial:  Limited support Homelessness  PLAN: 1. Alcohol withdrawal protocol 2. Prozac 20 mg daily 3  Trazodone 50 mg HS 4. Continue medical medications at home dose.  5. Encourage unit groups and activities 6. Monitor mood, behavior and interaction with peers 7. Motivational enhancement  8. SW would coordinate disposition and aftercare   Observation Level/Precautions:  Detox 15 minute checks  Laboratory:    Psychotherapy:    Medications:    Consultations:    Discharge Concerns:    Estimated  LOS: 5-7 days  Other:     Physician Treatment Plan for Primary Diagnosis: <principal problem not specified> Long Term Goal(s): Improvement in symptoms so as ready for discharge  Short Term Goals: Ability to identify changes in lifestyle to reduce recurrence of condition will improve,  Ability to verbalize feelings will improve, Ability to disclose and discuss suicidal ideas, Ability to demonstrate self-control will improve, Ability to identify and develop effective coping behaviors will improve, Ability to maintain clinical measurements within normal limits will improve, Compliance with prescribed medications will improve and Ability to identify triggers associated with substance abuse/mental health issues will improve  Physician Treatment Plan for Secondary Diagnosis: Active Problems:   Severe recurrent major depression without psychotic features ([place])  Long Term Goal(s): Improvement in symptoms so as ready for discharge  Short Term Goals: Ability to identify changes in lifestyle to reduce recurrence of condition will improve, Ability to verbalize feelings will improve, Ability to disclose and discuss suicidal ideas, Ability to demonstrate self-control will improve, Ability to identify and develop effective coping behaviors will improve, Ability to maintain clinical measurements within normal limits will improve, Compliance with prescribed medications will improve and Ability to identify triggers associated with substance abuse/mental health issues will improve  I certify that inpatient services furnished can reasonably be expected to improve the patient's condition.    [place], MD 12/21/20184:52 PM

## 2017-11-13 NOTE — B[facility] Suicide Risk Assessment (Signed)
[facility]   Nursing information obtained from:    Demographic factors:    Current Mental Status:    Loss Factors:    Historical Factors:    Risk Reduction Factors:     Total Time spent with patient: 30 minutes Principal Problem: Alcohol dependence (HCC) Diagnosis:   Patient Active Problem List   Diagnosis Date Noted  . Alcohol abuse with intoxication (HCC) [F10.129]   . Severe recurrent major depression without psychotic features (HCC) [F33.2] 06/13/2017  . Alcohol dependence (HCC) [F10.20] 11/27/2012  . GERD (gastroesophageal reflux disease) [K21.9] 02/12/2012  . Hypertension [I10] 01/06/2012  . COPD (chronic obstructive pulmonary disease) (HCC) [J44.9] 01/06/2012  . Hyperlipidemia [E78.5] 01/06/2012  . Anxiety [F41.9] 01/06/2012  . Neck pain, chronic [M54.2, G89.29] 01/06/2012   Subjective Data:  56 y.o Caucasian male, single, lives alone, on SSI. Background history of Alcohol Use Disorder and MDD. [facility] Presented there intoxicated with alcohol.  He was having auditory of hallucinations. Heard voices telling him to hurt others. He was reported to have overdosed on Xanax. Patient is coming off alcohol and benzodiazepines. He has past history of impulsivity. Past suicidal behavior. Psychosis is resolving. No evidence of mania. No cognitive impairment. No access to weapons. He is cooperative with care. He has agreed to treatment recommendations. He has agreed to communicate suicidal thoughts of with staff if the thoughts becomes overwhelming.      Continued Clinical Symptoms:  Alcohol Use Disorder Identification Test Final Score (AUDIT): 36 The "Alcohol Use Disorders Identification Test", Guidelines for Use in Primary Care, Second Edition.  World Science writerHealth Organization [facility](WHO). Score between 0-7:  no or low risk or alcohol related problems. Score between 8-15:  moderate risk of alcohol related problems. Score between 16-19:  high risk  of alcohol related problems. Score 20 or above:  warrants further diagnostic evaluation for alcohol dependence and treatment.   CLINICAL FACTORS:   Alcohol/Substance Abuse/Dependencies   Musculoskeletal: Strength & Muscle Tone: within normal limits Gait & Station: normal Patient leans: N/A  Psychiatric Specialty Exam: Physical Exam  ROS  Blood pressure 128/85, pulse 88, temperature 97.7 F (36.5 C), temperature source Oral, resp. rate 20, height 5\' 9"  (1.753 m), weight 93.9 kg (207 lb).Body mass index is 30.57 kg/m.  General Appearance: As in H&P  Eye Contact:    Speech:    Volume:    Mood:    Affect:    Thought Process:    Orientation:    Thought Content:  As in H&P  Suicidal Thoughts:    Homicidal Thoughts:    Memory:    Judgement:    Insight:    Psychomotor Activity:    Concentration:    Recall:  As in H&P  Fund of Knowledge:    Language:    Akathisia:    Handed:    AIMS (if indicated):     Assets:    ADL's:    Cognition:  As in H&P  Sleep:  Number of Hours: 3.75      COGNITIVE FEATURES THAT CONTRIBUTE TO RISK:  None    SUICIDE RISK:   Minimal: No identifiable suicidal ideation.  Patients presenting with no risk factors but with morbid ruminations; may be classified as minimal risk based on the severity of the depressive symptoms  PLAN OF CARE:  As in H&P  I certify that inpatient services furnished can reasonably be expected to improve the patient's condition.   Georgiann CockerVincent A Permelia Bamba, MD 11/13/2017,  5:07 PM
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
## 2017-11-13 NOTE — Progress Notes (Signed)
D. Pt appears anxious and depressed on approach, complaint of continued headache, withdrawal symptoms.  Pt was positive for evening AA group, minimal interaction with peers on unit.  Pt denies SI/HI/ at this time but does report some auditory hallucinations.  A.  Support and encouragement offered, medication given as ordered for withdrawal.  R.  Pt remains safe on the unit, will continue to monitor.

## 2017-11-13 NOTE — Tx Team (Signed)
Interdisciplinary Treatment and Diagnostic Plan Update  11/13/2017 Time of Session: [place] MRN: [id number]  Principal Diagnosis: MDD, recurrent, severe  Secondary Diagnoses: Active Problems:   Severe recurrent major depression without psychotic features (HCC)   Current Medications:  Current Facility-Administered Medications  Medication Dose Route Frequency Provider Last Rate Last Dose  . acetaminophen (TYLENOL) tablet 650 mg  650 mg Oral Q6H PRN Lindon Romp A, NP   650 mg at 11/13/17 0910  . alum & mag hydroxide-simeth (MAALOX/MYLANTA) 200-200-20 MG/5ML suspension 30 mL  30 mL Oral Q4H PRN Lindon Romp A, NP      . atorvastatin (LIPITOR) tablet 20 mg  20 mg Oral q1800 Lindon Romp A, NP      . FLUoxetine (PROZAC) capsule 20 mg  20 mg Oral Daily Lindon Romp A, NP   20 mg at 11/13/17 0754  . hydrOXYzine (ATARAX/VISTARIL) tablet 25 mg  25 mg Oral Q6H PRN Lindon Romp A, NP   25 mg at 11/13/17 0910  . loperamide (IMODIUM) capsule 2-4 mg  2-4 mg Oral PRN Lindon Romp A, NP   4 mg at 11/13/17 0910  . LORazepam (ATIVAN) tablet 1 mg  1 mg Oral Q6H PRN Lindon Romp A, NP   1 mg at 11/13/17 0302  . LORazepam (ATIVAN) tablet 1 mg  1 mg Oral QID Lindon Romp A, NP   1 mg at 11/13/17 0754   Followed by  . [START ON 11/14/2017] LORazepam (ATIVAN) tablet 1 mg  1 mg Oral TID Rozetta Nunnery, NP       Followed by  . [START ON 11/15/2017] LORazepam (ATIVAN) tablet 1 mg  1 mg Oral BID Rozetta Nunnery, NP       Followed by  . [START ON 11/17/2017] LORazepam (ATIVAN) tablet 1 mg  1 mg Oral Daily Lindon Romp A, NP      . magnesium hydroxide (MILK OF MAGNESIA) suspension 30 mL  30 mL Oral Daily PRN Lindon Romp A, NP      . nicotine (NICODERM CQ - dosed in mg/24 hours) patch 21 mg  21 mg Transdermal Daily Cobos, Myer Peer, MD   21 mg at 11/13/17 0754  . ondansetron (ZOFRAN-ODT) disintegrating tablet 4 mg  4 mg Oral Q6H PRN Lindon Romp A, NP   4 mg at 11/13/17 0910  . Oxcarbazepine  (TRILEPTAL) tablet 300 mg  300 mg Oral BID Lindon Romp A, NP   300 mg at 11/13/17 0754  . prazosin (MINIPRESS) capsule 1 mg  1 mg Oral QHS Lindon Romp A, NP   1 mg at 11/12/17 2059  . traZODone (DESYREL) tablet 50 mg  50 mg Oral QHS PRN Rozetta Nunnery, NP   50 mg at 11/12/17 2059   PTA Medications: Medications Prior to Admission  Medication Sig Dispense Refill Last Dose  . albuterol (PROVENTIL HFA;VENTOLIN HFA) 108 (90 BASE) MCG/ACT inhaler Inhale 2 puffs into the lungs every 6 (six) hours as needed. :For shortness of breath.     Marland Kitchen atorvastatin (LIPITOR) 20 MG tablet Take 1 tablet (20 mg total) by mouth daily at 6 PM. 30 tablet 0   . FLUoxetine (PROZAC) 40 MG capsule Take 1 capsule (40 mg total) by mouth daily. 30 capsule 0   . hydrOXYzine (ATARAX/VISTARIL) 50 MG tablet Take 1 tablet (50 mg total) by mouth every 6 (six) hours as needed for anxiety. 30 tablet 0   . mometasone-formoterol (DULERA) 200-5 MCG/ACT AERO Inhale 2 puffs into the  lungs 2 (two) times daily. For wheezing/shortness of breath. 1 Inhaler 3   . nicotine (NICODERM CQ - DOSED IN MG/24 HOURS) 21 mg/24hr patch Place 1 patch (21 mg total) onto the skin daily. 28 patch 0   . Oxcarbazepine (TRILEPTAL) 600 MG tablet Take 1 tablet (600 mg total) by mouth 2 (two) times daily. 60 tablet 0   . prazosin (MINIPRESS) 1 MG capsule Take 1 capsule (1 mg total) by mouth at bedtime. 30 capsule 0   . traZODone (DESYREL) 50 MG tablet Take 1 tablet (50 mg total) by mouth at bedtime. 30 tablet 0     Patient Stressors: Health problems Substance abuse  Patient Strengths: Ability for insight Average or above average intelligence Capable of independent living Communication skills Motivation for treatment/growth  Treatment Modalities: Medication Management, Group therapy, Case management,  1 to 1 session with clinician, Psychoeducation, Recreational therapy.  Physician Treatment Plan for Primary Diagnosis: MDD, recurrent, severe  Medication  Management: Evaluate patient's response, side effects, and tolerance of medication regimen.  Therapeutic Interventions: 1 to 1 sessions, Unit Group sessions and Medication administration.  Evaluation of Outcomes: Not Met  Physician Treatment Plan for Secondary Diagnosis: Active Problems:   Severe recurrent major depression without psychotic features ([place])  Medication Management: Evaluate patient's response, side effects, and tolerance of medication regimen.  Therapeutic Interventions: 1 to 1 sessions, Unit Group sessions and Medication administration.  Evaluation of Outcomes: Not Met   RN Treatment Plan for Primary Diagnosis: MDD, recurrent, severe Long Term Goal(s): Knowledge of disease and therapeutic regimen to maintain health will improve  Short Term Goals: Ability to remain free from injury will improve, Ability to disclose and discuss suicidal ideas and Ability to identify and develop effective coping behaviors will improve  Medication Management: RN will administer medications as ordered by provider, will assess and evaluate patient's response and provide education to patient for prescribed medication. RN will report any adverse and/or side effects to prescribing provider.  Therapeutic Interventions: 1 on 1 counseling sessions, Psychoeducation, Medication administration, Evaluate responses to treatment, Monitor vital signs and CBGs as ordered, Perform/monitor CIWA, COWS, AIMS and Fall Risk screenings as ordered, Perform wound care treatments as ordered.  Evaluation of Outcomes: Not Met   LCSW Treatment Plan for Primary Diagnosis: MDD, recurrent, severe Long Term Goal(s): Safe transition to appropriate next level of care at discharge, Engage patient in therapeutic group addressing interpersonal concerns.  Short Term Goals: Engage patient in aftercare planning with referrals and resources, Facilitate patient progression through stages of change regarding substance use diagnoses  and concerns and Identify triggers associated with mental health/substance abuse issues  Therapeutic Interventions: Assess for all discharge needs, 1 to 1 time with Social worker, Explore available resources and support systems, Assess for adequacy in community support network, Educate family and significant other(s) on suicide prevention, Complete Psychosocial Assessment, Interpersonal group therapy.  Evaluation of Outcomes: Not Met   Progress in Treatment: Attending groups: No. New to unit. Continuing to assess.  Participating in groups: No. Taking medication as prescribed: Yes. Toleration medication: Yes. Family/Significant other contact made: No, will contact:  family member if patient consents.  Patient understands diagnosis: Yes. Discussing patient identified problems/goals with staff: Yes. Medical problems stabilized or resolved: Yes. Denies suicidal/homicidal ideation: Yes. Issues/concerns per patient self-inventory: No. Other: n/a  New problem(s) identified: No, Describe:  n/a  New Short Term/Long Term Goal(s): detox, medication management for mood stabilization, elimination of SI thoughts, development of comprehensive mental wellness/sobriety plan.   Discharge  Plan or Barriers: CSW assessing for appropriate referrals. Pt [facility] and [facility] to [facility]. He is currently homeless and reports no current mental health providers.   Reason for Continuation of Hospitalization: Anxiety Depression Medication stabilization Suicidal ideation Withdrawal symptoms  Estimated Length of Stay: Monday, 11/16/17  Attendees: Patient: 11/13/2017 10:55 AM  Physician: Dr. Sanjuana Letters MD; Dr. Nancy Fetter MD 11/13/2017 10:55 AM  Nursing: Linna Hoff RN; [facility] RN 11/13/2017 10:55 AM  RN Care Manager:x 11/13/2017 10:55 AM  Social Worker: Press photographer, LCSW 11/13/2017 10:55 AM  Recreational Therapist: x 11/13/2017 10:55 AM  Other: Lindell Spar NP; Darnelle Maffucci Money NP 11/13/2017 10:55 AM   Other:  11/13/2017 10:55 AM  Other: 11/13/2017 10:55 AM    Scribe for Treatment Team: [place], LCSW 11/13/2017 10:55 AM

## 2017-11-13 NOTE — Progress Notes (Signed)
Patient attended the evening A.A. meeting and was appropriate.  

## 2017-11-13 NOTE — Plan of Care (Signed)
Nurse discussed anxiety, depression, coping skills with patient. 

## 2017-11-14 DIAGNOSIS — F10230 Alcohol dependence with withdrawal, uncomplicated: Secondary | ICD-10-CM

## 2017-11-14 MED ORDER — TIOTROPIUM BROMIDE MONOHYDRATE 18 MCG IN CAPS
18.0000 ug | ORAL_CAPSULE | Freq: Every day | RESPIRATORY_TRACT | Status: DC
  Administered 2017-11-14 – 2017-11-16 (×3): 18 ug via RESPIRATORY_TRACT
  Filled 2017-11-14: qty 5

## 2017-11-14 MED ORDER — TRAZODONE HCL 100 MG PO TABS
100.0000 mg | ORAL_TABLET | Freq: Every evening | ORAL | Status: DC | PRN
  Administered 2017-11-14 – 2017-11-15 (×2): 100 mg via ORAL
  Filled 2017-11-14 (×2): qty 1

## 2017-11-14 MED ORDER — ALBUTEROL SULFATE HFA 108 (90 BASE) MCG/ACT IN AERS
2.0000 | INHALATION_SPRAY | Freq: Four times a day (QID) | RESPIRATORY_TRACT | Status: DC | PRN
  Filled 2017-11-14: qty 6.7

## 2017-11-14 MED ORDER — MOMETASONE FURO-FORMOTEROL FUM 200-5 MCG/ACT IN AERO
2.0000 | INHALATION_SPRAY | Freq: Two times a day (BID) | RESPIRATORY_TRACT | Status: DC
  Administered 2017-11-14 – 2017-11-16 (×5): 2 via RESPIRATORY_TRACT
  Filled 2017-11-14: qty 8.8

## 2017-11-14 NOTE — Progress Notes (Signed)
D: Pt denies SI/HI/AVH. Pt is pleasant and cooperative. Pt forwards little information. Pt stated he was a little nauseous and given PRN Zofran per [facility] with some effectiveness.    A: Pt was offered support and encouragement. Pt was given scheduled medications. Pt was encourage to attend groups. Q 15 minute checks were done for safety.   R:Pt attends groups and interacts well with peers and staff. Pt is taking medication. Pt receptive to treatment and safety maintained on unit.

## 2017-11-14 NOTE — Progress Notes (Signed)
D. Pt presents with an anxious affect and behavior. Pt sitting in dayroom upon initial approach. Pt complaining of anxiety and head ache 6/10. Per pt's self inventory, pt rates his depression, hopelessness and anxiety a 8/8/8, respectively. Pt reports that his goal today is "getting better" and "take meds and rest". Pt currently denies SI/HI.  A. Labs and vitals monitored. Pt given prn med for headache and reported that it "took the edge off". Pt compliant with scheduled meds. Pt  supported emotionally and encouraged to express concerns and ask questions.   R. Pt in bed resting- appears to be sleeping- resp even and unlabored. Pt remains safe with 15 minute checks. Will continue POC.

## 2017-11-14 NOTE — BHH Group Notes (Signed)
LCSW Group Therapy Note  Date/Time:  11/14/2017   1:15-2:15pm  Type of Therapy and Topic:  Group Therapy:  Fears and Unhealthy/Healthy Coping Skills  Participation Level:  Active   Description of Group:  The focus of this group was to discuss some of the prevalent fears that patients experience, and to identify the commonalities among group members.  A fun exercise was used to initiate the discussion, followed by writing on the white board a group-generated list of unhealthy coping and healthy coping techniques to deal with each fear.    Therapeutic Goals: 1. Patient will be able to distinguish between healthy and unhealthy coping skills 2. Patient will identify and describe 3 fears they experience 3. Patient will identify one positive coping strategy for each fear they experience 4. Patient will respond empathetically to peers' statements regarding fears they experience  Summary of Patient Progress:  The patient expressed himself freely throughout group and was interested, engaged.  Therapeutic Modalities Cognitive Behavioral Therapy Motivational Interviewing  Ambrose MantleMareida Grossman-Orr, LCSW

## 2017-11-14 NOTE — Progress Notes (Signed)
[facility] MD Progress Note  11/14/2017 11:30 AM Jason Bean  MRN:  [id number] Subjective:  Alert, oriented, dysthymic, feels nausea and anxious HPI: as per admission and records "56 y.o Caucasian male, single, lives alone, on SSI. Background history of Alcohol Use Disorder and MDD. Transferred from [facility] ER. Presented there intoxicated with alcohol.  He was having auditory of hallucinations. [place] voices telling him to hurt others. He was reported to have overdosed on Xanax." On evaluation today remains dysphoric but not suicidal. sasys he was hanging out with the wrong people Alcohol detox with ativan started. Still feels anxious, nauseated . Not sweating or retching. No fullness of head. On prozac  On trileptal for seizures  no headaches   Principal Problem: Alcohol dependence ([place]) Diagnosis:   Patient Active Problem List   Diagnosis Date Noted  . Alcohol abuse with intoxication ([place]) [F10.129]   . Severe recurrent major depression without psychotic features ([place]) [F33.2] 06/13/2017  . Alcohol dependence ([place]) [F10.20] 11/27/2012  . GERD (gastroesophageal reflux disease) [K21.9] 02/12/2012  . Hypertension [I10] 01/06/2012  . COPD (chronic obstructive pulmonary disease) ([place]) [J44.9] 01/06/2012  . Hyperlipidemia [E78.5] 01/06/2012  . Anxiety [F41.9] 01/06/2012  . Neck pain, chronic [M54.2, G89.29] 01/06/2012   Total Time spent with patient: 30 minutes  Past Psychiatric History: depression, alcohol use  Past Medical History:  Past Medical History:  Diagnosis Date  . Arthritis   . COPD (chronic obstructive pulmonary disease) ([place])   . Depression   . Hyperlipidemia   . Hypertension     Past Surgical History:  Procedure Laterality Date  . BACK SURGERY    . NECK SURGERY     Family History: History reviewed. No pertinent family history. Family Psychiatric  History: see chart Social History:  Social History   Substance and Sexual Activity  Alcohol Use Yes   Comment:  Pt drinks daily . unknown amount     Social History   Substance and Sexual Activity  Drug Use No    Social History   Socioeconomic History  . Marital status: Single    Spouse name: None  . Number of children: None  . Years of education: None  . Highest education level: None  Social Needs  . Financial resource strain: None  . Food insecurity - worry: None  . Food insecurity - inability: None  . Transportation needs - medical: None  . Transportation needs - non-medical: None  Occupational History  . None  Tobacco Use  . Smoking status: Current Every Day Smoker    Packs/day: 1.00    Years: 30.00    Pack years: 30.00    Types: Cigarettes  . Smokeless tobacco: Never Used  Substance and Sexual Activity  . Alcohol use: Yes    Comment: Pt drinks daily . unknown amount  . Drug use: No  . Sexual activity: Not Currently  Other Topics Concern  . 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   Additional Social History:    Pain Medications: N/A Prescriptions: Atorvatatin Calcium, Fluticasone/Vilanterol, Montelucast Sodium, Albuterol, Alprazolam, Flonase, Robaxin 811, Requip, Folic Acid, Thiamine Over the Counter: Unknown History of alcohol / drug use?: Yes Name of Substance 1: ETOH 1 - Age of First Use: Unknown 1 - Amount (size/oz): Varies 1 - Frequency: Daily over the lst several days 1 - Duration: unknown 1 - Last Use / Amount: 12/20  Sleep: Poor  Appetite:  Fair  Current Medications: Current Facility-Administered Medications  Medication Dose Route Frequency Provider Last Rate Last Dose  . acetaminophen (TYLENOL) tablet 650 mg  650 mg Oral Q6H PRN Lindon Romp A, NP   650 mg at 11/14/17 0848  . albuterol (PROVENTIL HFA;VENTOLIN HFA) 108 (90 Base) MCG/ACT inhaler 2 puff  2 puff Inhalation Q6H PRN Lindon Romp A, NP      . alum & mag hydroxide-simeth (MAALOX/MYLANTA) 200-200-20 MG/5ML  suspension 30 mL  30 mL Oral Q4H PRN Lindon Romp A, NP      . atorvastatin (LIPITOR) tablet 20 mg  20 mg Oral q1800 Lindon Romp A, NP   20 mg at 11/13/17 1812  . FLUoxetine (PROZAC) capsule 20 mg  20 mg Oral Daily Lindon Romp A, NP   20 mg at 11/14/17 0751  . hydrOXYzine (ATARAX/VISTARIL) tablet 25 mg  25 mg Oral Q6H PRN Lindon Romp A, NP   25 mg at 11/13/17 2130  . loperamide (IMODIUM) capsule 2-4 mg  2-4 mg Oral PRN Rozetta Nunnery, NP   2 mg at 11/13/17 2127  . LORazepam (ATIVAN) tablet 1 mg  1 mg Oral Q6H PRN Lindon Romp A, NP   1 mg at 11/14/17 0528  . LORazepam (ATIVAN) tablet 1 mg  1 mg Oral TID Rozetta Nunnery, NP       Followed by  . [START ON 11/15/2017] LORazepam (ATIVAN) tablet 1 mg  1 mg Oral BID Rozetta Nunnery, NP       Followed by  . [START ON 11/17/2017] LORazepam (ATIVAN) tablet 1 mg  1 mg Oral Daily Lindon Romp A, NP      . magnesium hydroxide (MILK OF MAGNESIA) suspension 30 mL  30 mL Oral Daily PRN Lindon Romp A, NP      . mometasone-formoterol (DULERA) 200-5 MCG/ACT inhaler 2 puff  2 puff Inhalation BID Lindon Romp A, NP   2 puff at 11/14/17 0753  . nicotine (NICODERM CQ - dosed in mg/24 hours) patch 21 mg  21 mg Transdermal Daily Cobos, Myer Peer, MD   21 mg at 11/14/17 0807  . ondansetron (ZOFRAN-ODT) disintegrating tablet 4 mg  4 mg Oral Q6H PRN Lindon Romp A, NP   4 mg at 11/13/17 2129  . Oxcarbazepine (TRILEPTAL) tablet 300 mg  300 mg Oral BID Lindon Romp A, NP   300 mg at 11/14/17 0751  . prazosin (MINIPRESS) capsule 1 mg  1 mg Oral QHS Lindon Romp A, NP   1 mg at 11/13/17 2127  . tiotropium (SPIRIVA) inhalation capsule 18 mcg  18 mcg Inhalation Daily Lindon Romp A, NP   18 mcg at 11/14/17 0754  . traZODone (DESYREL) tablet 50 mg  50 mg Oral QHS,MR X 1 Lindon Romp A, NP   50 mg at 11/13/17 2149    Lab Results:  Results for orders placed or performed during the hospital encounter of 11/12/17 (from the past 48 hour(s))  Hemoglobin A1c     Status: Abnormal    Collection Time: 11/13/17  7:58 AM  Result Value Ref Range   Hgb A1c MFr Bld 5.8 (H) 4.8 - 5.6 %    Comment: (NOTE) Pre diabetes:          5.7%-6.4% Diabetes:              >6.4% Glycemic control for   <7.0% adults with diabetes    Mean Plasma Glucose 119.76 mg/dL    Comment:  Performed at [facility], [place] [address] [place], [place] [address]  Lipid panel     Status: Abnormal   Collection Time: 11/13/17  7:58 AM  Result Value Ref Range   Cholesterol 146 0 - 200 mg/dL   Triglycerides 237 (H) <150 mg/dL   HDL 82 >40 mg/dL   Total CHOL/HDL Ratio 1.8 RATIO   VLDL 47 (H) 0 - 40 mg/dL   LDL Cholesterol 17 0 - 99 mg/dL    Comment:        Total Cholesterol/HDL:CHD Risk Coronary Heart Disease Risk Table                     Men   Women  1/2 Average Risk   3.4   3.3  Average Risk       5.0   4.4  2 X Average Risk   9.6   7.1  3 X Average Risk  23.4   11.0        Use the calculated Patient Ratio above and the CHD Risk Table to determine the patient's CHD Risk.        ATP III CLASSIFICATION (LDL):  <100     mg/dL   Optimal  100-129  mg/dL   Near or Above                    Optimal  130-159  mg/dL   Borderline  160-189  mg/dL   High  >190     mg/dL   Very High Performed at [facility] [address], [place], [place] [address]   TSH     Status: None   Collection Time: 11/13/17  7:58 AM  Result Value Ref Range   TSH 1.973 0.350 - 4.500 uIU/mL    Comment: Performed by a 3rd Generation assay with a functional sensitivity of <=0.01 uIU/mL. Performed at [facility], [place] [address] [place], [place] [address]     Blood Alcohol level:  Lab Results  Component Value Date   ETH 316 (H) 11/26/2012   ETH 212 (H) 79/48/0165    Metabolic Disorder Labs: Lab Results  Component Value Date   HGBA1C 5.8 (H) 11/13/2017   MPG 119.76 11/13/2017   MPG 123 06/14/2017   No results found for: PROLACTIN Lab Results  Component Value Date    CHOL 146 11/13/2017   TRIG 237 (H) 11/13/2017   HDL 82 11/13/2017   CHOLHDL 1.8 11/13/2017   VLDL 47 (H) 11/13/2017   LDLCALC 17 11/13/2017   LDLCALC 47 06/14/2017    Physical Findings: AIMS: Facial and Oral Movements Muscles of Facial Expression: None, normal Lips and Perioral Area: None, normal Jaw: None, normal Tongue: None, normal,Extremity Movements Upper (arms, wrists, hands, fingers): None, normal Lower (legs, knees, ankles, toes): None, normal, Trunk Movements Neck, shoulders, hips: None, normal, Overall Severity Severity of abnormal movements (highest score from questions above): None, normal Incapacitation due to abnormal movements: None, normal Patient's awareness of abnormal movements (rate only patient's report): No Awareness, Dental Status Current problems with teeth and/or dentures?: Yes Does patient usually wear dentures?: Yes  CIWA:  CIWA-Ar Total: 6 COWS:  COWS Total Score: 2  Musculoskeletal: Strength & Muscle Tone: within normal limits Gait & Station: normal Patient leans: no lean  Psychiatric Specialty Exam: Physical Exam  Review of Systems  Cardiovascular: Negative for chest pain.  Skin: Negative for rash.  Psychiatric/Behavioral: Positive for depression and substance abuse.  Blood pressure 133/82, pulse 82, temperature 98.7 F (37.1 C), temperature source Oral, resp. rate 18, height 5' 9"  (1.753 m), weight 93.9 kg (207 lb).Body mass index is 30.57 kg/m.  General Appearance: Casual  Eye Contact:  Fair  Speech:  Slow  Volume:  Decreased  Mood:  Dysphoric  Affect:  Congruent and Constricted  Thought Process:  Goal Directed  Orientation:  Full (Time, Place, and Person)  Thought Content:  Rumination  Suicidal Thoughts:  No  Homicidal Thoughts:  No  Memory:  Immediate;   Fair Recent;   Fair  Judgement:  Poor  Insight:  Shallow  Psychomotor Activity:  Decreased  Concentration:  Concentration: Fair and Attention Span: Fair  Recall:  [employer] of Knowledge:  Fair  Language:  Fair  Akathisia:  Negative  Handed:  Right  AIMS (if indicated):     Assets:  Desire for Improvement  ADL's:  Intact  Cognition:  WNL  Sleep:  Number of Hours: 4.5     Treatment Plan Summary: Daily contact with patient to assess and evaluate symptoms and progress in treatment and Medication management  As follows  1. Major depression severe; continue prozac 2. Aclohol use and withdrawals: continue detox with ativan. Vitals stable  Continue other meds and attending groups.  Somewhat better then yesterday . To continue detox to stabilize  Merian Capron, MD 11/14/2017, 11:30 AM

## 2017-11-14 NOTE — Plan of Care (Signed)
  Activity: Sleeping patterns will improve 11/14/2017 2052 - Progressing by Delos HaringPhillips, Tamlyn Sides A, RN Note Pt slept 4.5   Safety: Periods of time without injury will increase 11/14/2017 2052 - Progressing by Delos HaringPhillips, Vasilia Dise A, RN Note Pt safe on the unit at this time

## 2017-11-15 NOTE — Progress Notes (Signed)
D:  Patient's self inventory sheet, patient has good sleep, sleep medication helpful.  Good appetite, normal energy level, poor concentration.  Rated depression and hopeless 7, anxiety 10.  Withdrawals, tremors, diarrhea, chilling, cramping, agitation, nausea, irritability.  Denied SI on form, then checked "sometimes".  Denied physical problems,  Denied physical pain.  Denied pain medication. A:  Medications administered per MD orders.  Emotional support and encouragement given patient. R:  Denied SI and HI while talking to nurse this morning, contracts for safety.  Denied A/V hallucinations.  Safety maintained with 15 minute checks.

## 2017-11-15 NOTE — Progress Notes (Signed)
D: Pt denies SI/HI/AVH. Pt is pleasant and cooperative. Pt seen keeping to himself for a little while this evening, but pt stated he was feeling a little better   A: Pt was offered support and encouragement. Pt was given scheduled medications. Pt was encourage to attend groups. Q 15 minute checks were done for safety.   R:Pt attends groups and interacts well with peers and staff. Pt is taking medication. Pt has no complaints at this time .Pt receptive to treatment and safety maintained on unit.

## 2017-11-15 NOTE — [facility] Group Notes (Signed)

## 2017-11-15 NOTE — Progress Notes (Signed)
[facility] MD Progress Note  11/15/2017 10:27 AM Jason Bean  MRN:  [id number] Subjective:  Alert, oriented, dysthymic, feels nausea and anxious HPI: as per admission and records "56 y.o Caucasian male, single, lives alone, on SSI. Background history of Alcohol Use Disorder and MDD. Transferred from [facility] ER. Presented there intoxicated with alcohol.  He was having auditory of hallucinations. [name] telling him to hurt others. He was reported to have overdosed on Xanax."  On evalutation doing better, tolerating withdrawals. On prozac, feels can go in one or two days Slept better. Ativan helping detox   Principal Problem: Alcohol dependence ([place]) Diagnosis:   Patient Active Problem List   Diagnosis Date Noted  . Alcohol abuse with intoxication ([place]) [F10.129]   . Severe recurrent major depression without psychotic features ([place]) [F33.2] 06/13/2017  . Alcohol dependence ([place]) [F10.20] 11/27/2012  . GERD (gastroesophageal reflux disease) [K21.9] 02/12/2012  . Hypertension [I10] 01/06/2012  . COPD (chronic obstructive pulmonary disease) ([place]) [J44.9] 01/06/2012  . Hyperlipidemia [E78.5] 01/06/2012  . Anxiety [F41.9] 01/06/2012  . Neck pain, chronic [M54.2, G89.29] 01/06/2012   Total Time spent with patient: 30 minutes  Past Psychiatric History: depression, alcohol use  Past Medical History:  Past Medical History:  Diagnosis Date  . Arthritis   . COPD (chronic obstructive pulmonary disease) ([place])   . Depression   . Hyperlipidemia   . Hypertension     Past Surgical History:  Procedure Laterality Date  . BACK SURGERY    . NECK SURGERY     Family History: History reviewed. No pertinent family history. Family Psychiatric  History: see chart Social History:  Social History   Substance and Sexual Activity  Alcohol Use Yes   Comment: Pt drinks daily . unknown amount     Social History   Substance and Sexual Activity  Drug Use No    Social History   Socioeconomic  History  . Marital status: Single    Spouse name: None  . Number of children: None  . Years of education: None  . Highest education level: None  Social Needs  . Financial resource strain: None  . Food insecurity - worry: None  . Food insecurity - inability: None  . Transportation needs - medical: None  . Transportation needs - non-medical: None  Occupational History  . None  Tobacco Use  . Smoking status: Current Every Day Smoker    Packs/day: 1.00    Years: 30.00    Pack years: 30.00    Types: Cigarettes  . Smokeless tobacco: Never Used  Substance and Sexual Activity  . Alcohol use: Yes    Comment: Pt drinks daily . unknown amount  . Drug use: No  . Sexual activity: Not Currently  Other Topics Concern  . 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   Additional Social History:    Pain Medications: N/A Prescriptions: Atorvatatin Calcium, Fluticasone/Vilanterol, Montelucast Sodium, Albuterol, Alprazolam, Flonase, Robaxin 409, Requip, Folic Acid, Thiamine Over the Counter: Unknown History of alcohol / drug use?: Yes Name of Substance 1: ETOH 1 - Age of First Use: Unknown 1 - Amount (size/oz): Varies 1 - Frequency: Daily over the lst several days 1 - Duration: unknown 1 - Last Use / Amount: 12/20                  Sleep: Poor  Appetite:  Fair  Current Medications: Current Facility-Administered Medications  Medication Dose  Route Frequency Provider Last Rate Last Dose  . acetaminophen (TYLENOL) tablet 650 mg  650 mg Oral Q6H PRN Lindon Romp A, NP   650 mg at 11/15/17 0845  . albuterol (PROVENTIL HFA;VENTOLIN HFA) 108 (90 Base) MCG/ACT inhaler 2 puff  2 puff Inhalation Q6H PRN Lindon Romp A, NP      . alum & mag hydroxide-simeth (MAALOX/MYLANTA) 200-200-20 MG/5ML suspension 30 mL  30 mL Oral Q4H PRN Lindon Romp A, NP      . atorvastatin (LIPITOR) tablet 20 mg  20 mg Oral q1800 Lindon Romp A, NP   20  mg at 11/14/17 1655  . FLUoxetine (PROZAC) capsule 20 mg  20 mg Oral Daily Lindon Romp A, NP   20 mg at 11/15/17 0731  . hydrOXYzine (ATARAX/VISTARIL) tablet 25 mg  25 mg Oral Q6H PRN Lindon Romp A, NP   25 mg at 11/14/17 2123  . loperamide (IMODIUM) capsule 2-4 mg  2-4 mg Oral PRN Lindon Romp A, NP   2 mg at 11/14/17 1147  . LORazepam (ATIVAN) tablet 1 mg  1 mg Oral Q6H PRN Lindon Romp A, NP   1 mg at 11/14/17 2123  . LORazepam (ATIVAN) tablet 1 mg  1 mg Oral BID Rozetta Nunnery, NP       Followed by  . [START ON 11/17/2017] LORazepam (ATIVAN) tablet 1 mg  1 mg Oral Daily Lindon Romp A, NP      . magnesium hydroxide (MILK OF MAGNESIA) suspension 30 mL  30 mL Oral Daily PRN Lindon Romp A, NP      . mometasone-formoterol (DULERA) 200-5 MCG/ACT inhaler 2 puff  2 puff Inhalation BID Lindon Romp A, NP   2 puff at 11/15/17 0732  . nicotine (NICODERM CQ - dosed in mg/24 hours) patch 21 mg  21 mg Transdermal Daily Cobos, Myer Peer, MD   21 mg at 11/15/17 8546  . ondansetron (ZOFRAN-ODT) disintegrating tablet 4 mg  4 mg Oral Q6H PRN Lindon Romp A, NP   4 mg at 11/14/17 1943  . Oxcarbazepine (TRILEPTAL) tablet 300 mg  300 mg Oral BID Lindon Romp A, NP   300 mg at 11/15/17 0729  . prazosin (MINIPRESS) capsule 1 mg  1 mg Oral QHS Lindon Romp A, NP   1 mg at 11/14/17 2124  . tiotropium (SPIRIVA) inhalation capsule 18 mcg  18 mcg Inhalation Daily Lindon Romp A, NP   18 mcg at 11/15/17 0730  . traZODone (DESYREL) tablet 100 mg  100 mg Oral QHS PRN Lindon Romp A, NP   100 mg at 11/14/17 2124    Lab Results:  No results found for this or any previous visit (from the past 48 hour(s)).  Blood Alcohol level:  Lab Results  Component Value Date   ETH 316 (H) 11/26/2012   ETH 212 (H) 27/01/5008    Metabolic Disorder Labs: Lab Results  Component Value Date   HGBA1C 5.8 (H) 11/13/2017   MPG 119.76 11/13/2017   MPG 123 06/14/2017   No results found for: PROLACTIN Lab Results  Component Value  Date   CHOL 146 11/13/2017   TRIG 237 (H) 11/13/2017   HDL 82 11/13/2017   CHOLHDL 1.8 11/13/2017   VLDL 47 (H) 11/13/2017   LDLCALC 17 11/13/2017   LDLCALC 47 06/14/2017    Physical Findings: AIMS: Facial and Oral Movements Muscles of Facial Expression: None, normal Lips and Perioral Area: None, normal Jaw: None, normal Tongue: None, normal,Extremity Movements Upper (arms, wrists,  hands, fingers): None, normal Lower (legs, knees, ankles, toes): None, normal, Trunk Movements Neck, shoulders, hips: None, normal, Overall Severity Severity of abnormal movements (highest score from questions above): None, normal Incapacitation due to abnormal movements: None, normal Patient's awareness of abnormal movements (rate only patient's report): No Awareness, Dental Status Current problems with teeth and/or dentures?: Yes Does patient usually wear dentures?: Yes  CIWA:  CIWA-Ar Total: 3 COWS:  COWS Total Score: 2  Musculoskeletal: Strength & Muscle Tone: within normal limits Gait & Station: normal Patient leans: no lean  Psychiatric Specialty Exam: Physical Exam  Review of Systems  Cardiovascular: Negative for palpitations.  Genitourinary: Negative for dysuria.  Skin: Negative for rash.  Psychiatric/Behavioral: Positive for depression and substance abuse.    Blood pressure 120/76, pulse 92, temperature 98.8 F (37.1 C), temperature source Oral, resp. rate 18, height 5' 9"  (1.753 m), weight 93.9 kg (207 lb).Body mass index is 30.57 kg/m.  General Appearance: Casual  Eye Contact:  Fair  Speech:  Slow  Volume:  Decreased  Mood:  Somewhat dysphoric  Affect: more reactive  Thought Process:  Goal Directed  Orientation:  Full (Time, Place, and Person)  Thought Content:  Rumination  Suicidal Thoughts:  No  Homicidal Thoughts:  No  Memory:  Immediate;   Fair Recent;   Fair  Judgement:  Poor  Insight:  Shallow  Psychomotor Activity:  Decreased  Concentration:  Concentration: Fair  and Attention Span: Fair  Recall:  [employer] of Knowledge:  Fair  Language:  Fair  Akathisia:  Negative  Handed:  Right  AIMS (if indicated):     Assets:  Desire for Improvement  ADL's:  Intact  Cognition:  WNL  Sleep:  Number of Hours: 6.75     Treatment Plan Summary: Daily contact with patient to assess and evaluate symptoms and progress in treatment and Medication management  As follows  1. Major depression severe; improved. Continue prozac 2. Aclohol use and withdrawals: vitals stable. Continue detox. May go home in one or two days Continue other meds and attending groups.  Somewhat better then yesterday . To continue detox to stabilize  Merian Capron, MD 11/15/2017, 10:27 AM

## 2017-11-15 NOTE — Plan of Care (Signed)
Nurse discussed depression, anxiety, coping skills with patient.  

## 2017-11-15 NOTE — Plan of Care (Signed)
  Activity: Sleeping patterns will improve 11/15/2017 2009 - Progressing by Delos HaringPhillips, Terrius Gentile A, RN Note Pt slept over 5 hrs last night   Self-Concept: Ability to disclose and discuss suicidal ideas will improve 11/15/2017 2009 - Progressing by Delos HaringPhillips, Revin Corker A, RN Note Pt denies SI at this time

## 2017-11-15 NOTE — [facility] Suicide Risk Assessment (Addendum)
[facility] INPATIENT:  Family/Significant Other Suicide Prevention Education  Suicide Prevention Education:  Contact Attempts: exgirlfriend Kennith Maeseggy Clem [phone] has been identified by the patient as the family member/significant other with whom the patient will be residing, and identified as the person(s) who will aid the patient in the event of a mental health crisis.  With written consent from the patient, two attempts were made to provide suicide prevention education, prior to and/or following the patient's discharge.  We were unsuccessful in providing suicide prevention education.  A suicide education pamphlet was given to the patient to share with family/significant other.  Date and time of first attempt:  11/15/2017  /8:40 AM  Date and time of second attempt:  11/15/2017  /  1:05PM  Carloyn JaegerMareida J Grossman-Orr 11/15/2017, 8:40 AM

## 2017-11-15 NOTE — [facility] Group Notes (Signed)
[facility]  Date/Time:  11/15/2017 1:30-2:30pm  Type of Therapy and Topic:  Group Therapy:  Healthy and Unhealthy Supports  Participation Level:  Active   Description of Group:  Patients in this group were introduced to the idea of adding a variety of healthy supports to address the various needs in their lives.  Because of one patient's resistance and insistence that drugs help rather than harm, the Stages of Change were introduced and ideas proposed about appropriate supports for each stage.  Therapeutic Goals:   1)  discuss importance of adding supports to stay well once out of the hospital  2)  compare healthy versus unhealthy supports and identify some examples of each  3)  Describe Stages of Change  3)  generate ideas and descriptions of healthy supports that can be added for each Stage  5)  encourage active participation in and adherence to discharge plan    Summary of Patient Progress:  The patient shared well in group and gave feedback to others.   He asked questions and was attentive.   Therapeutic Modalities:   Motivational Interviewing Brief Solution-Focused Therapy  Jason MantleMareida Grossman-Orr, LCSW

## 2017-11-16 MED ORDER — OXCARBAZEPINE 300 MG PO TABS: 300.0000 mg | ORAL_TABLET | Freq: Two times a day (BID) | ORAL | 0 refills | Status: DC

## 2017-11-16 MED ORDER — MOMETASONE FURO-FORMOTEROL FUM 200-5 MCG/ACT IN AERO: 2.0000 | INHALATION_SPRAY | Freq: Two times a day (BID) | RESPIRATORY_TRACT | 3 refills | Status: DC

## 2017-11-16 MED ORDER — PRAZOSIN HCL 1 MG PO CAPS: 1.0000 mg | ORAL_CAPSULE | Freq: Every day | ORAL | 0 refills | Status: DC

## 2017-11-16 MED ORDER — ATORVASTATIN CALCIUM 20 MG PO TABS: 20.0000 mg | ORAL_TABLET | Freq: Every day | ORAL | 0 refills | Status: DC

## 2017-11-16 MED ORDER — HYDROXYZINE HCL 50 MG PO TABS: 50.0000 mg | ORAL_TABLET | Freq: Four times a day (QID) | ORAL | 0 refills | Status: DC | PRN

## 2017-11-16 MED ORDER — FLUOXETINE HCL 20 MG PO CAPS: 20.0000 mg | ORAL_CAPSULE | Freq: Every day | ORAL | 0 refills | Status: DC

## 2017-11-16 MED ORDER — ALBUTEROL SULFATE HFA 108 (90 BASE) MCG/ACT IN AERS: 2.0000 | INHALATION_SPRAY | Freq: Four times a day (QID) | RESPIRATORY_TRACT | Status: DC | PRN

## 2017-11-16 MED ORDER — TRAZODONE HCL 100 MG PO TABS: 100.0000 mg | ORAL_TABLET | Freq: Every evening | ORAL | 0 refills | Status: DC | PRN

## 2017-11-16 MED ORDER — NICOTINE 21 MG/24HR TD PT24: 21.0000 mg | MEDICATED_PATCH | Freq: Every day | TRANSDERMAL | 0 refills | Status: DC

## 2017-11-16 MED ORDER — TIOTROPIUM BROMIDE MONOHYDRATE 18 MCG IN CAPS: 18.0000 ug | ORAL_CAPSULE | Freq: Every day | RESPIRATORY_TRACT | 0 refills | Status: DC

## 2017-11-16 NOTE — [facility] Suicide Risk Assessment (Signed)
[facility]   Principal Problem: Alcohol dependence [facility](HCC) Discharge Diagnoses:  Patient Active Problem List   Diagnosis Date Noted  . Alcohol abuse with intoxication (HCC) [F10.129]   . Severe recurrent major depression without psychotic features (HCC) [F33.2] 06/13/2017  . Alcohol dependence (HCC) [F10.20] 11/27/2012  . GERD (gastroesophageal reflux disease) [K21.9] 02/12/2012  . Hypertension [I10] 01/06/2012  . COPD (chronic obstructive pulmonary disease) (HCC) [J44.9] 01/06/2012  . Hyperlipidemia [E78.5] 01/06/2012  . Anxiety [F41.9] 01/06/2012  . Neck pain, chronic [M54.2, G89.29] 01/06/2012    Total Time spent with patient: 30 minutes  Musculoskeletal: Strength & Muscle Tone: within normal limits Gait & Station: normal Patient leans: no lean  Psychiatric Specialty Exam: Review of Systems  Cardiovascular: Negative for chest pain and palpitations.  Psychiatric/Behavioral: Negative for depression.    Blood pressure 115/75, pulse 86, temperature 97.9 F (36.6 C), temperature source Oral, resp. rate 18, height 5\' 9"  (1.753 m), weight 93.9 kg (207 lb).Body mass index is 30.57 kg/m.  General Appearance: Casual  Eye Contact::  Fair  Speech:  Slow409  Volume:  Normal  Mood:  Euthymic  Affect:  Constricted  Thought Process:  Goal Directed  Orientation:  Full (Time, Place, and Person)  Thought Content:  Logical  Suicidal Thoughts:  No  Homicidal Thoughts:  No  Memory:  Immediate;   Fair Recent;   Fair  Judgement:  Fair  Insight:  Fair  Psychomotor Activity:  Normal  Concentration:  Good  Recall:  Good  Fund of Knowledge:Good  Language: Good  Akathisia:  Negative  Handed:  Right  AIMS (if indicated):     Assets:  Desire for Improvement  Sleep:  Number of Hours: 6.75  Cognition: WNL  ADL's:  Intact   Mental Status Per Nursing Assessment::   On Admission:     Demographic Factors:  Male  Loss Factors: Decrease in vocational  status  Historical Factors: NA  Risk Reduction Factors:   Sense of responsibility to family  Continued Clinical Symptoms:  Dysthymia  Cognitive Features That Contribute To Risk:  None    Suicide Risk:  Minimal: No identifiable suicidal ideation.  Patients presenting with no risk factors but with morbid ruminations; may be classified as minimal risk based on the severity of the depressive symptoms  Follow-up Information    [facility].   Why:  Social worker updated information in your chart-the scheduler asked that you call their office on Wed 12/26 in order to schedule follow-up with Franki MonteStephanie Hudlell NP, as it is not possible on Friday. Thank you.  Contact information: [address] [place], [place] [address] Phone: [phone] Fax: [phone]          Plan Of Care/Follow-up recommendations:  Activity:  as tolerated   Discharge today. Follow up with recommendations. See discharge summary for details Thresa RossNadeem Susana Duell, MD 11/16/2017, 10:29 AM

## 2017-11-16 NOTE — Progress Notes (Signed)
Writer assume care for Pt. Pt is seen laying in bed, awake. Will monitor.

## 2017-11-16 NOTE — Plan of Care (Signed)
  Progressing Activity: Interest or engagement in activities will improve 11/16/2017 0938 - Progressing by Angela AdamBeaudry, Jaynie Hitch E, RN Sleeping patterns will improve 11/16/2017 [phone] - Progressing by Angela AdamBeaudry, Pius Byrom E, RN Education: Knowledge of [facility] Education information/materials will improve 11/16/2017 [phone] - Progressing by Angela AdamBeaudry, Draden Cottingham E, RN Emotional status will improve 11/16/2017 0938 - Progressing by Angela AdamBeaudry, Alexsander Cavins E, RN Mental status will improve 11/16/2017 [phone] Progressing by Angela AdamBeaudry, Aviyon Hocevar E, RN Health Behavior/Discharge Planning: Identification of resources available to assist in meeting health care needs will improve 11/16/2017 0938 - Progressing by Angela AdamBeaudry, Naylene Foell E, RN Activity: Interest or engagement in leisure activities will improve 11/16/2017 0938 - Progressing by Angela AdamBeaudry, Darden Flemister E, RN Imbalance in normal sleep/wake cycle will improve 11/16/2017 0938 - Progressing by Angela AdamBeaudry, Katena Petitjean E, RN Coping: Ability to cope will improve 11/16/2017 0938 - Progressing by Angela AdamBeaudry, Davan Nawabi E, RN

## 2017-11-16 NOTE — Progress Notes (Signed)
D: Patient report good sleep and appetite; her energy level is normal and her concentration is good.  He rates his depression, hopelessness and anxiety as a 6.  He denies any thoughts of self harm.  He rates his depression, hopelessness and anxiety as a 6.  He is attending groups and appears engaged. Patient reports minimal withdrawal symptoms.  Patient is complaining of a headache, however, did not request any medication.  A: Continue to monitor medication management and MD orders.  Safety checks continued every 15 minutes per protocol.  Offer support and encouragement as needed.  R: Patient is receptive to staff; his behavior is appropriate.

## 2017-11-16 NOTE — Progress Notes (Signed)
Discharge note:  Patient discharged per MD order.  He received all personal belongings from unit and locker.  Reviewed AVS/transition record with patient and she indicated understanding.  He denies any thoughts of self harm.  Patient left with prescriptions.  Patient left [place] [facility] [place] [facility]

## 2017-11-16 NOTE — Progress Notes (Signed)
Patient requesting ativan and does not meet CIWA protocol.  He states, "ask for some more questions and I'll give you some more points."  Explained to patient that was not how it worked.  MD informed patient he was probably being discharged today.  Explained to patient that he not receive the ativan since he was being discharged.  Patient irate because the social worker was not aware of his discharge disposition.  Patient states that the sheriff's office was supposed to pick him up after discharge.  He was agitated and irate because this had not been set up.  Explained to patient that usually this was set up by social work the day before.

## 2017-11-16 NOTE — Discharge Summary (Signed)
Physician Discharge Summary Note  Patient:  Jason Bean is an 56 y.o., male MRN:  [id number] DOB:  Apr 12, 1961 Patient phone:  [phone] (home)  Patient address:   [address] [place] [address],  Total Time spent with patient: Greater than 30 minutes  Date of Admission:  11/12/2017 Date of Discharge: 11/16/2017  Reason for Admission: Auditory hallucinations/alcohol intoxication & overdose on Xanax.  Principal Problem: Alcohol dependence [facility](HCC)  Discharge Diagnoses: Patient Active Problem List   Diagnosis Date Noted  . Alcohol abuse with intoxication (HCC) [F10.129]   . Severe recurrent major depression without psychotic features (HCC) [F33.2] 06/13/2017  . Alcohol dependence (HCC) [F10.20] 11/27/2012  . GERD (gastroesophageal reflux disease) [K21.9] 02/12/2012  . Hypertension [I10] 01/06/2012  . COPD (chronic obstructive pulmonary disease) (HCC) [J44.9] 01/06/2012  . Hyperlipidemia [E78.5] 01/06/2012  . Anxiety [F41.9] 01/06/2012  . Neck pain, chronic [M54.2, G89.29] 01/06/2012    Past Psychiatric History: Patient has been treated for depression with Prozac. He takes Amitriptyline for sleep. He is on Trileptal as a mood stabilizer and for treatment of seizures.  Patient has had multiple inpatient admissions over the years. Says it is usually related to suicidal thoughts/ behavior while under the influence of substances. He had overdosed in the past. He has cut his wrist a couple of times in the past. Has transient psychosis while coming off alcohol. No past history of mania. No past history of violent behavior.   Past Medical History:  Past Medical History:  Diagnosis Date  . Arthritis   . COPD (chronic obstructive pulmonary disease) (HCC)   . Depression   . Hyperlipidemia   . Hypertension     Past Surgical History:  Procedure Laterality Date  . BACK SURGERY    . NECK SURGERY     Family History: History reviewed. No pertinent family history.  Family  Psychiatric  History:  Family history of addiction. No family history of suicide. No family history of mental illness.   Social History:  Social History   Substance and Sexual Activity  Alcohol Use Yes   Comment: Pt drinks daily . unknown amount     Social History   Substance and Sexual Activity  Drug Use No    Social History   Socioeconomic History  . Marital status: Single    Spouse name: None  . Number of children: None  . Years of education: None  . Highest education level: None  Social Needs  . Financial resource strain: None  . Food insecurity - worry: None  . Food insecurity - inability: None  . Transportation needs - medical: None  . Transportation needs - non-medical: None  Occupational History  . None  Tobacco Use  . Smoking status: Current Every Day Smoker    Packs/day: 1.00    Years: 30.00    Pack years: 30.00    Types: Cigarettes  . Smokeless tobacco: Never Used  Substance and Sexual Activity  . Alcohol use: Yes    Comment: Pt drinks daily . unknown amount  . Drug use: No  . Sexual activity: Not Currently  Other Topics Concern  . 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  Has daughter   Hospital Course: (Per admission notes): 56 y.o Caucasian male, single, lives alone, on SSI. Background history of Alcohol Use Disorder and MDD. [facility] Presented there intoxicated with alcohol.  He was having auditory of hallucinations. Marthe PatchHeard  voices telling him to hurt others. He was reported to have overdosed on Xanax.  At interview patient tells me that he was sober until two weeks ago. Says he started hanging out with the wrong people, he stopped attending AA meetings. Says he lost control and started popping more xanax. Says his PCP prescribes it for him. Patient says he felt suicidal while intoxicated. Tells me that he feels okay now. Denies any lingering suicidal thoughts. Says he wants to detox  and go back home. Says he was seeing things and hearing thins before. Says the medications has helped. Currently he denies any sweatiness, no headaches. No retching, nausea or vomiting. No fullness in the head. No visual, tactile or auditory hallucination. No internal restlessness. No suicidal or homicidal thoughts. Patient is optimistic about the future. Says he wants to get back on his medications. No access to weapons.   After the above admission assessment, Jason Bean  presenting symptoms were identified. His UDS was positive for Benzodiazepines. He presented with a history of alcohol use disorder. Detoxification treatments were administered as appropriate using ativan detoxification regimen on a tapering dose format . He was also medicated & discharged on; Prozac to 20 mg daily for depression, Hydroxyzine 50 mg prn for anxiety, Trileptal 300 mg po bid for mood stabilization, Minipress 1 mg for nightmares, Nicotine patch 21 mg for smoking cessation Trazodone 100 mg po daily at bedtime for insomnia. He received other medication regimen for the other medical issues presented. He was enrolled & actively  participated in the group counseling sessions, AA/NA meetings being offered & held on this unit. He was able to verbalize coping skills that should help him cope better to maintain depression/sobriety/mood stability after discharge.  During the course of his hospitalization, Jason Bean's improvement was monitored by observation & his daily report of symptom reduction noted. This is evidenced by his presentation of good affect, improved mood & behavior. Jason Bean's case was presented during treatment team meeting this morning. The team members all agreed that he was both mentally & medically stable to be discharged to continue mental health care/substance abuse treatment on an outpatient basis as noted below. He was provided with all the necessary information needed to make this appointment without problems.  Upon  discharge, Jason Bean denied any SIHI, AVH, delusional thoughts or paranoia. He also denied any substance withdrawal symptoms. He was provided with a  prescription for his [facility] discharge medications to be taken to his local pharmacy to be filled. He left [facility] with all personal belongings in no apparent distress. Transportation per his arrangement.  Physical Findings: AIMS: Facial and Oral Movements Muscles of Facial Expression: None, normal Lips and Perioral Area: None, normal Jaw: None, normal Tongue: None, normal,Extremity Movements Upper (arms, wrists, hands, fingers): None, normal Lower (legs, knees, ankles, toes): None, normal, Trunk Movements Neck, shoulders, hips: None, normal, Overall Severity Severity of abnormal movements (highest score from questions above): None, normal Incapacitation due to abnormal movements: None, normal Patient's awareness of abnormal movements (rate only patient's report): No Awareness, Dental Status Current problems with teeth and/or dentures?: Yes Does patient usually wear dentures?: Yes  CIWA:  CIWA-Ar Total: 0 COWS:  COWS Total Score: 1  Musculoskeletal: Strength & Muscle Tone: within normal limits Gait & Station: normal Patient leans: N/A  Psychiatric Specialty Exam: SEE SRA BY MD  Physical Exam  Nursing note and vitals reviewed. Constitutional: He is oriented to person, place, and time. He appears well-developed.  HENT:  Head: Normocephalic.  Eyes: Pupils  are equal, round, and reactive to light.  Neck: Normal range of motion.  Cardiovascular: Normal rate.  Respiratory: Effort normal.  GI: Soft.  Genitourinary:  Genitourinary Comments: Deferred  Musculoskeletal: Normal range of motion.  Neurological: He is alert and oriented to person, place, and time.  Skin: Skin is warm.    Review of Systems  Psychiatric/Behavioral: Positive for substance abuse (Hx of substance abuse ). Negative for hallucinations, memory loss and suicidal ideas (Hx of  substance abuse ). Depression: IMPROVED. Nervous/anxious: improved. Insomnia: improved.   All other systems reviewed and are negative.   Blood pressure 115/75, pulse 86, temperature 97.9 F (36.6 C), temperature source Oral, resp. rate 18, height 5\' 9"  (1.753 m), weight 93.9 kg (207 lb).Body mass index is 30.57 kg/m.   Have you used any form of tobacco in the last 30 days? (Cigarettes, Smokeless Tobacco, Cigars, and/or Pipes): Yes  Has this patient used any form of tobacco in the last 30 days? (Cigarettes, Smokeless Tobacco, Cigars, and/or Pipes): Yes, an FDA-approved tobacco cessation medication was offered at discharge.  Blood Alcohol level:  Lab Results  Component Value Date   ETH 316 (H) 11/26/2012   ETH 212 (H) 03/09/2012    Metabolic Disorder Labs:  Lab Results  Component Value Date   HGBA1C 5.8 (H) 11/13/2017   MPG 119.76 11/13/2017   MPG 123 06/14/2017   No results found for: PROLACTIN Lab Results  Component Value Date   CHOL 146 11/13/2017   TRIG 237 (H) 11/13/2017   HDL 82 11/13/2017   CHOLHDL 1.8 11/13/2017   VLDL 47 (H) 11/13/2017   LDLCALC 17 11/13/2017   LDLCALC 47 06/14/2017   See Psychiatric Specialty Exam and Suicide Risk Assessment completed by Attending Physician prior to discharge.  Discharge destination:  Home  Is patient on multiple antipsychotic therapies at discharge:  No   Has Patient had three or more failed trials of antipsychotic monotherapy by history:  No  Recommended Plan for Multiple Antipsychotic Therapies: NA  Allergies as of 11/16/2017   No Known Allergies     Medication List    STOP taking these medications   ALPRAZolam 0.5 MG tablet Commonly known as:  XANAX   montelukast 10 MG tablet Commonly known as:  SINGULAIR   risperiDONE 2 MG tablet Commonly known as:  RISPERDAL     TAKE these medications     Indication  albuterol 108 (90 Base) MCG/ACT inhaler Commonly known as:  PROVENTIL HFA;VENTOLIN HFA Inhale 2 puffs  into the lungs every 6 (six) hours as needed. :For shortness of breath.  Indication:  Asthma, SOB   atorvastatin 20 MG tablet Commonly known as:  LIPITOR Take 1 tablet (20 mg total) by mouth daily at 6 PM. For high cholesterol What changed:  additional instructions  Indication:  High Amount of Triglycerides in the Blood   FLUoxetine 20 MG capsule Commonly known as:  PROZAC Take 1 capsule (20 mg total) by mouth daily. For depression Start taking on:  11/17/2017 What changed:    medication strength  how much to take  additional instructions  Indication:  Depression   hydrOXYzine 50 MG tablet Commonly known as:  ATARAX/VISTARIL Take 1 tablet (50 mg total) by mouth every 6 (six) hours as needed for anxiety.  Indication:  Feeling Anxious, ANXIETY   mometasone-formoterol 200-5 MCG/ACT Aero Commonly known as:  DULERA Inhale 2 puffs into the lungs 2 (two) times daily. For wheezing/shortness of breath.  Indication:  Asthma   nicotine 21  mg/24hr patch Commonly known as:  NICODERM CQ - dosed in mg/24 hours Place 1 patch (21 mg total) onto the skin daily. (May buy from over the counter): For smoking cessation Start taking on:  11/17/2017 What changed:  additional instructions  Indication:  Nicotine Addiction   Oxcarbazepine 300 MG tablet Commonly known as:  TRILEPTAL Take 1 tablet (300 mg total) by mouth 2 (two) times daily. For mood stabilization What changed:    medication strength  how much to take  additional instructions  Indication:  MOOD STABILIZAION   prazosin 1 MG capsule Commonly known as:  MINIPRESS Take 1 capsule (1 mg total) by mouth at bedtime. For nightmares What changed:  additional instructions  Indication:  Nightmares   tiotropium 18 MCG inhalation capsule Commonly known as:  SPIRIVA Place 1 capsule (18 mcg total) into inhaler and inhale daily. For Asthma Start taking on:  11/17/2017 What changed:    when to take this  additional instructions   Indication:  Chronic Obstructive Lung Disease   traZODone 100 MG tablet Commonly known as:  DESYREL Take 1 tablet (100 mg total) by mouth at bedtime as needed for sleep. What changed:    medication strength  how much to take  when to take this  reasons to take this  Indication:  Trouble Sleeping      Follow-up Information    [facility].   Why:  Social worker updated information in your chart-the scheduler asked that you call their office on Wed 12/26 in order to schedule follow-up with Franki MonteStephanie Hudlell NP, as it is not possible on Friday. Thank you.  Contact information: [address] [place], [place] [address] Phone: [phone] Fax: [phone]         Follow-up recommendations:  Follow up with your outpatient provided for any medical issues. Activity & diet as recommended by your primary care provider.  Comments:  Patient is instructed prior to discharge to: Take all medications as prescribed by his/her mental healthcare provider. Report any adverse effects and or reactions from the medicines to his/her outpatient provider promptly. Patient has been instructed & cautioned: To not engage in alcohol and or illegal drug use while on prescription medicines. In the event of worsening symptoms, patient is instructed to call the crisis hotline, 911 and or go to the nearest ED for appropriate evaluation and treatment of symptoms. To follow-up with his/her primary care provider for your other medical issues, concerns and or health care needs.  Signed: Armandina StammerAgnes Nwoko, NP 11/16/2017, 10:59 AM  Patient was seen . Agree to notes. Suicide assessment was done

## 2017-11-16 NOTE — Progress Notes (Signed)
Recreation Therapy Notes  Date: 11/16/17 Time: 0930 Location: 300 Hall Group Room  Group Topic: Stress Management  Goal Area(s) Addresses:  Patient will verbalize importance of using healthy stress management.  Patient will identify positive emotions associated with healthy stress management.   Intervention: Stress Management  Activity :  Meditation.  LRT introduced the stress management technique of meditation.  LRT played a meditation from the Calm app.  Patients were to listen and follow along as meditation was played.  Education: Stress Management, Discharge Planning.   Education Outcome: Acknowledges edcuation/In group clarification offered/Needs additional education  Clinical Observations/Feedback: Pt did not attend group.   Caroll RancherMarjette Jasher Barkan, LRT/CTRS          Caroll RancherLindsay, Eamon Tantillo A 11/16/2017 12:07 PM

## 2017-11-16 NOTE — Progress Notes (Signed)
  [facility] :  Will you be returning to the same living situation after discharge:  Yes,  home At discharge, do you have transportation home?: Yes,  sheriff or cab Do you have the ability to pay for your medications: Yes,  insurance  Release of information consent forms completed and in the chart;  Patient's signature needed at discharge.  Patient to Follow up at: Follow-up Information    [facility].   Why:  Social worker updated information in your chart-the scheduler asked that you call their office on Wed 12/26 in order to schedule follow-up with Franki MonteStephanie Hudlell NP, as it is not possible on Friday. Thank you.  Contact information: [address] [place], [place] [address] Phone: [phone] Fax: [phone]          Next level of care provider has access to [facility] Link:no  Safety Planning and Suicide Prevention discussed: Yes,  yes  Have you used any form of tobacco in the last 30 days? (Cigarettes, Smokeless Tobacco, Cigars, and/or Pipes): Yes  Has patient been referred to the Quitline?: Patient refused referral  Patient has been referred for addiction treatment: Pt. refused referral  Ida RogueRodney B Kortney Potvin, LCSW 11/16/2017, 11:20 AM

## 2017-11-16 NOTE — Plan of Care (Signed)
Completed/Met Activity: Interest or engagement in activities will improve 11/16/2017 1308 - Completed/Met by Joice Lofts, RN 11/16/2017 [phone] - Progressing by Joice Lofts, RN Sleeping patterns will improve 11/16/2017 1308 - Completed/Met by Joice Lofts, RN 11/16/2017 [phone] - Progressing by Joice Lofts, RN Education: Knowledge of [place] Education information/materials will improve 11/16/2017 1308 - Completed/Met by Joice Lofts, RN 11/16/2017 [phone] - Progressing by Joice Lofts, RN Emotional status will improve 11/16/2017 1308 - Completed/Met by Joice Lofts, RN 11/16/2017 [phone] - Progressing by Joice Lofts, RN Mental status will improve 11/16/2017 1308 - Completed/Met by Joice Lofts, RN 11/16/2017 [phone] - Progressing by Joice Lofts, RN Verbalization of understanding the information provided will improve 11/16/2017 1308 - Completed/Met by Joice Lofts, RN Coping: Ability to verbalize frustrations and anger appropriately will improve 11/16/2017 1308 - Completed/Met by Joice Lofts, RN Ability to demonstrate self-control will improve 11/16/2017 1308 - Completed/Met by Joice Lofts, RN Health Behavior/Discharge Planning: Identification of resources available to assist in meeting health care needs will improve 11/16/2017 1308 - Completed/Met by Joice Lofts, RN Compliance with treatment plan for underlying cause of condition will improve 11/16/2017 0938 - Completed/Met by Joice Lofts, RN Physical Regulation: Ability to maintain clinical measurements within normal limits will improve 11/16/2017 0938 - Completed/Met by Joice Lofts, RN Safety: Periods of time without injury will increase 11/16/2017 0938 - Completed/Met by Joice Lofts, RN Education: Ability to make informed decisions regarding treatment will improve 11/16/2017 1308 - Completed/Met by  Joice Lofts, RN Coping: Ability to cope will improve 11/16/2017 1308 - Completed/Met by Joice Lofts, RN Health Behavior/Discharge Planning: Identification of resources available to assist in meeting health care needs will improve 11/16/2017 1308 - Completed/Met by Joice Lofts, RN 11/16/2017 [phone] - Progressing by Joice Lofts, RN Medication: Compliance with prescribed medication regimen will improve 11/16/2017 1308 - Completed/Met by Joice Lofts, RN Self-Concept: Ability to disclose and discuss suicidal ideas will improve 11/16/2017 1308 - Completed/Met by Joice Lofts, RN Ability to verbalize positive feelings about self will improve 11/16/2017 1308 - Completed/Met by Joice Lofts, RN Activity: Interest or engagement in leisure activities will improve 11/16/2017 1308 - Completed/Met by Joice Lofts, RN 11/16/2017 [phone] - Progressing by Joice Lofts, RN Imbalance in normal sleep/wake cycle will improve 11/16/2017 1308 - Completed/Met by Joice Lofts, RN 11/16/2017 [phone] - Progressing by Joice Lofts, RN Education: Utilization of techniques to improve thought processes will improve 11/16/2017 1308 - Completed/Met by Joice Lofts, RN Knowledge of the prescribed therapeutic regimen will improve 11/16/2017 1308 - Completed/Met by Joice Lofts, RN Coping: Ability to cope will improve 11/16/2017 1308 - Completed/Met by Joice Lofts, RN 11/16/2017 [phone] - Progressing by Joice Lofts, RN Ability to verbalize feelings will improve 11/16/2017 1308 - Completed/Met by Joice Lofts, [place] Behavior/Discharge Planning: Ability to make decisions will improve 11/16/2017 1308 - Completed/Met by Joice Lofts, RN Compliance with therapeutic regimen will improve 11/16/2017 1308 - Completed/Met by Joice Lofts, RN Role Relationship: Ability to demonstrate positive  changes in social behaviors and relationships will improve 11/16/2017 1308 - Completed/Met by Joice Lofts, RN Safety: Ability to disclose and discuss suicidal ideas will improve 11/16/2017 1308 - Completed/Met by Joice Lofts, RN Ability to identify and utilize support systems that promote safety will improve 11/16/2017 1308 - Completed/Met by Mayra Neer  E, RN Self-Concept: Ability to verbalize positive feelings about self will improve 11/16/2017 1308 - Completed/Met by Joice Lofts, RN Level of anxiety will decrease 11/16/2017 1308 - Completed/Met by Joice Lofts, RN Education: Knowledge of disease or condition will improve 11/16/2017 1308 - Completed/Met by Joice Lofts, RN Understanding of discharge needs will improve 11/16/2017 1308 - Completed/Met by Joice Lofts, [place] Behavior/Discharge Planning: Ability to identify changes in lifestyle to reduce recurrence of condition will improve 11/16/2017 1308 - Completed/Met by Joice Lofts, RN Identification of resources available to assist in meeting health care needs will improve 11/16/2017 1308 - Completed/Met by Joice Lofts, RN Physical Regulation: Complications related to the disease process, condition or treatment will be avoided or minimized 11/16/2017 1308 - Completed/Met by Joice Lofts, RN Safety: Ability to remain free from injury will improve 11/16/2017 1308 - Completed/Met by Joice Lofts, RN Spiritual Needs Ability to function at adequate level 11/16/2017 1308 - Completed/Met by Joice Lofts, RN

## 2017-11-21 DIAGNOSIS — Z79899 Other long term (current) drug therapy: Secondary | ICD-10-CM | POA: Diagnosis not present

## 2017-11-21 DIAGNOSIS — Z751 Person awaiting admission to adequate facility elsewhere: Secondary | ICD-10-CM | POA: Diagnosis not present

## 2017-11-21 DIAGNOSIS — J45909 Unspecified asthma, uncomplicated: Secondary | ICD-10-CM | POA: Diagnosis not present

## 2017-11-21 DIAGNOSIS — K219 Gastro-esophageal reflux disease without esophagitis: Secondary | ICD-10-CM | POA: Diagnosis not present

## 2017-11-21 DIAGNOSIS — E7800 Pure hypercholesterolemia, unspecified: Secondary | ICD-10-CM | POA: Diagnosis not present

## 2017-11-21 DIAGNOSIS — R197 Diarrhea, unspecified: Secondary | ICD-10-CM | POA: Diagnosis not present

## 2017-11-21 DIAGNOSIS — I10 Essential (primary) hypertension: Secondary | ICD-10-CM | POA: Diagnosis not present

## 2017-11-22 ENCOUNTER — Inpatient Hospital Stay (HOSPITAL_COMMUNITY)
Admission: AD | Admit: 2017-11-22 | Discharge: 2017-11-26 | DRG: 885 | Disposition: A | Discharge status: Home / Self Care | Payer: Medicare Other | Source: Intra-hospital | Attending: Psychiatry | Admitting: Psychiatry

## 2017-11-22 ENCOUNTER — Encounter (HOSPITAL_COMMUNITY): Payer: Self-pay | Admitting: *Deleted

## 2017-11-22 ENCOUNTER — Other Ambulatory Visit: Payer: Self-pay

## 2017-11-22 DIAGNOSIS — G8929 Other chronic pain: Secondary | ICD-10-CM | POA: Diagnosis not present

## 2017-11-22 DIAGNOSIS — F332 Major depressive disorder, recurrent severe without psychotic features: Secondary | ICD-10-CM | POA: Diagnosis not present

## 2017-11-22 DIAGNOSIS — T446X6A Underdosing of alpha-adrenoreceptor antagonists, initial encounter: Secondary | ICD-10-CM | POA: Diagnosis present

## 2017-11-22 DIAGNOSIS — K219 Gastro-esophageal reflux disease without esophagitis: Secondary | ICD-10-CM | POA: Diagnosis not present

## 2017-11-22 DIAGNOSIS — T43226A Underdosing of selective serotonin reuptake inhibitors, initial encounter: Secondary | ICD-10-CM | POA: Diagnosis present

## 2017-11-22 DIAGNOSIS — E785 Hyperlipidemia, unspecified: Secondary | ICD-10-CM | POA: Diagnosis present

## 2017-11-22 DIAGNOSIS — F1910 Other psychoactive substance abuse, uncomplicated: Secondary | ICD-10-CM | POA: Diagnosis not present

## 2017-11-22 DIAGNOSIS — F1310 Sedative, hypnotic or anxiolytic abuse, uncomplicated: Secondary | ICD-10-CM | POA: Diagnosis not present

## 2017-11-22 DIAGNOSIS — I10 Essential (primary) hypertension: Secondary | ICD-10-CM | POA: Diagnosis present

## 2017-11-22 DIAGNOSIS — G40909 Epilepsy, unspecified, not intractable, without status epilepticus: Secondary | ICD-10-CM | POA: Diagnosis present

## 2017-11-22 DIAGNOSIS — G4700 Insomnia, unspecified: Secondary | ICD-10-CM | POA: Diagnosis present

## 2017-11-22 DIAGNOSIS — Z79899 Other long term (current) drug therapy: Secondary | ICD-10-CM | POA: Diagnosis not present

## 2017-11-22 DIAGNOSIS — T421X6A Underdosing of iminostilbenes, initial encounter: Secondary | ICD-10-CM | POA: Diagnosis present

## 2017-11-22 DIAGNOSIS — J45909 Unspecified asthma, uncomplicated: Secondary | ICD-10-CM | POA: Diagnosis not present

## 2017-11-22 DIAGNOSIS — F1020 Alcohol dependence, uncomplicated: Secondary | ICD-10-CM | POA: Diagnosis not present

## 2017-11-22 DIAGNOSIS — F17210 Nicotine dependence, cigarettes, uncomplicated: Secondary | ICD-10-CM | POA: Diagnosis present

## 2017-11-22 DIAGNOSIS — F322 Major depressive disorder, single episode, severe without psychotic features: Secondary | ICD-10-CM | POA: Diagnosis present

## 2017-11-22 DIAGNOSIS — Z751 Person awaiting admission to adequate facility elsewhere: Secondary | ICD-10-CM | POA: Diagnosis not present

## 2017-11-22 DIAGNOSIS — E7800 Pure hypercholesterolemia, unspecified: Secondary | ICD-10-CM | POA: Diagnosis not present

## 2017-11-22 DIAGNOSIS — R45851 Suicidal ideations: Secondary | ICD-10-CM | POA: Diagnosis present

## 2017-11-22 DIAGNOSIS — Z91128 Patient's intentional underdosing of medication regimen for other reason: Secondary | ICD-10-CM | POA: Diagnosis not present

## 2017-11-22 DIAGNOSIS — F410 Panic disorder [episodic paroxysmal anxiety] without agoraphobia: Secondary | ICD-10-CM | POA: Diagnosis present

## 2017-11-22 DIAGNOSIS — F419 Anxiety disorder, unspecified: Secondary | ICD-10-CM | POA: Diagnosis present

## 2017-11-22 DIAGNOSIS — J449 Chronic obstructive pulmonary disease, unspecified: Secondary | ICD-10-CM | POA: Diagnosis present

## 2017-11-22 DIAGNOSIS — F1024 Alcohol dependence with alcohol-induced mood disorder: Secondary | ICD-10-CM | POA: Diagnosis present

## 2017-11-22 DIAGNOSIS — F10230 Alcohol dependence with withdrawal, uncomplicated: Secondary | ICD-10-CM | POA: Diagnosis not present

## 2017-11-22 DIAGNOSIS — R450 Nervousness: Secondary | ICD-10-CM | POA: Diagnosis not present

## 2017-11-22 DIAGNOSIS — F1099 Alcohol use, unspecified with unspecified alcohol-induced disorder: Secondary | ICD-10-CM | POA: Diagnosis not present

## 2017-11-22 DIAGNOSIS — R197 Diarrhea, unspecified: Secondary | ICD-10-CM | POA: Diagnosis not present

## 2017-11-22 MED ORDER — LORAZEPAM 1 MG PO TABS
1.0000 mg | ORAL_TABLET | Freq: Three times a day (TID) | ORAL | Status: AC
  Administered 2017-11-23 – 2017-11-24 (×3): 1 mg via ORAL
  Filled 2017-11-22 (×3): qty 1

## 2017-11-22 MED ORDER — ONDANSETRON 4 MG PO TBDP
4.0000 mg | ORAL_TABLET | Freq: Four times a day (QID) | ORAL | Status: AC | PRN
  Administered 2017-11-23: 4 mg via ORAL
  Filled 2017-11-22: qty 1

## 2017-11-22 MED ORDER — CHLORDIAZEPOXIDE HCL 25 MG PO CAPS: 25.0000 mg | ORAL_CAPSULE | Freq: Four times a day (QID) | ORAL | Status: DC | PRN

## 2017-11-22 MED ORDER — LORAZEPAM 1 MG PO TABS: 1.0000 mg | ORAL_TABLET | Freq: Every day | ORAL | Status: DC

## 2017-11-22 MED ORDER — CHLORDIAZEPOXIDE HCL 25 MG PO CAPS: 25.0000 mg | ORAL_CAPSULE | Freq: Every day | ORAL | Status: DC

## 2017-11-22 MED ORDER — CHLORDIAZEPOXIDE HCL 25 MG PO CAPS: 25.0000 mg | ORAL_CAPSULE | ORAL | Status: DC

## 2017-11-22 MED ORDER — VITAMIN B-1 100 MG PO TABS
100.0000 mg | ORAL_TABLET | Freq: Every day | ORAL | Status: DC
  Filled 2017-11-22 (×2): qty 1

## 2017-11-22 MED ORDER — ONDANSETRON 4 MG PO TBDP
4.0000 mg | ORAL_TABLET | Freq: Four times a day (QID) | ORAL | Status: DC | PRN
  Administered 2017-11-22: 4 mg via ORAL
  Filled 2017-11-22: qty 1

## 2017-11-22 MED ORDER — LOPERAMIDE HCL 2 MG PO CAPS
2.0000 mg | ORAL_CAPSULE | ORAL | Status: AC | PRN
  Administered 2017-11-23 (×4): 2 mg via ORAL
  Filled 2017-11-22 (×4): qty 1

## 2017-11-22 MED ORDER — CHLORDIAZEPOXIDE HCL 25 MG PO CAPS: 25.0000 mg | ORAL_CAPSULE | Freq: Three times a day (TID) | ORAL | Status: DC

## 2017-11-22 MED ORDER — CHLORDIAZEPOXIDE HCL 25 MG PO CAPS
25.0000 mg | ORAL_CAPSULE | Freq: Four times a day (QID) | ORAL | Status: DC
  Administered 2017-11-22: 25 mg via ORAL
  Filled 2017-11-22: qty 1

## 2017-11-22 MED ORDER — ADULT MULTIVITAMIN W/MINERALS CH
1.0000 | ORAL_TABLET | Freq: Every day | ORAL | Status: DC
  Administered 2017-11-22: 1 via ORAL
  Filled 2017-11-22 (×4): qty 1

## 2017-11-22 MED ORDER — ACETAMINOPHEN 325 MG PO TABS
650.0000 mg | ORAL_TABLET | Freq: Four times a day (QID) | ORAL | Status: DC | PRN
  Administered 2017-11-22 – 2017-11-24 (×4): 650 mg via ORAL
  Filled 2017-11-22 (×4): qty 2

## 2017-11-22 MED ORDER — LORAZEPAM 1 MG PO TABS
1.0000 mg | ORAL_TABLET | Freq: Four times a day (QID) | ORAL | Status: AC
  Administered 2017-11-22 – 2017-11-23 (×4): 1 mg via ORAL
  Filled 2017-11-22 (×4): qty 1

## 2017-11-22 MED ORDER — ALBUTEROL SULFATE HFA 108 (90 BASE) MCG/ACT IN AERS: 2.0000 | INHALATION_SPRAY | Freq: Four times a day (QID) | RESPIRATORY_TRACT | Status: DC | PRN

## 2017-11-22 MED ORDER — THIAMINE HCL 100 MG/ML IJ SOLN
100.0000 mg | Freq: Once | INTRAMUSCULAR | Status: AC
  Administered 2017-11-22: 100 mg via INTRAMUSCULAR
  Filled 2017-11-22: qty 2

## 2017-11-22 MED ORDER — NICOTINE 21 MG/24HR TD PT24
21.0000 mg | MEDICATED_PATCH | Freq: Every day | TRANSDERMAL | Status: DC
  Administered 2017-11-22 – 2017-11-26 (×4): 21 mg via TRANSDERMAL
  Filled 2017-11-22 (×9): qty 1

## 2017-11-22 MED ORDER — ADULT MULTIVITAMIN W/MINERALS CH
1.0000 | ORAL_TABLET | Freq: Every day | ORAL | Status: DC
  Administered 2017-11-23 – 2017-11-26 (×3): 1 via ORAL
  Filled 2017-11-22 (×9): qty 1

## 2017-11-22 MED ORDER — LORAZEPAM 1 MG PO TABS
1.0000 mg | ORAL_TABLET | Freq: Two times a day (BID) | ORAL | Status: DC
  Administered 2017-11-24: 1 mg via ORAL
  Filled 2017-11-22: qty 1

## 2017-11-22 MED ORDER — HYDROXYZINE HCL 25 MG PO TABS: 25.0000 mg | ORAL_TABLET | Freq: Three times a day (TID) | ORAL | Status: DC | PRN

## 2017-11-22 MED ORDER — MAGNESIUM HYDROXIDE 400 MG/5ML PO SUSP: 30.0000 mL | Freq: Every day | ORAL | Status: DC | PRN

## 2017-11-22 MED ORDER — LORAZEPAM 1 MG PO TABS
1.0000 mg | ORAL_TABLET | Freq: Four times a day (QID) | ORAL | Status: AC | PRN
  Administered 2017-11-23: 1 mg via ORAL
  Filled 2017-11-22: qty 1

## 2017-11-22 MED ORDER — THIAMINE HCL 100 MG/ML IJ SOLN: 100.0000 mg | Freq: Once | INTRAMUSCULAR | Status: DC

## 2017-11-22 MED ORDER — VITAMIN B-1 100 MG PO TABS
100.0000 mg | ORAL_TABLET | Freq: Every day | ORAL | Status: DC
  Administered 2017-11-23 – 2017-11-26 (×3): 100 mg via ORAL
  Filled 2017-11-22 (×7): qty 1

## 2017-11-22 MED ORDER — LOPERAMIDE HCL 2 MG PO CAPS: 2.0000 mg | ORAL_CAPSULE | ORAL | Status: DC | PRN

## 2017-11-22 MED ORDER — HYDROXYZINE HCL 25 MG PO TABS
25.0000 mg | ORAL_TABLET | Freq: Four times a day (QID) | ORAL | Status: AC | PRN
  Administered 2017-11-22 – 2017-11-24 (×5): 25 mg via ORAL
  Filled 2017-11-22 (×6): qty 1

## 2017-11-22 MED ORDER — ALUM & MAG HYDROXIDE-SIMETH 200-200-20 MG/5ML PO SUSP: 30.0000 mL | ORAL | Status: DC | PRN

## 2017-11-22 NOTE — Progress Notes (Signed)
D: Pt was at the nurse's station upon initial approach.  Pt presents with anxious, depressed affect and mood.  When asked about his day, he states "it was going bad but then I got it straightened out, I'm good now that I'm changed from Librium."  Pt denies SI/HI, denies hallucinations, reports pain from headache of 8/10.  Pt has been visible in milieu interacting with peers and staff appropriately.  Reports withdrawal symptoms of anxiety, headache, and tremor.  Pt attended evening group.    A: Introduced self to pt.  Actively listened to pt and offered support and encouragement. Medication administered per order.  PRN medication administered for pain and anxiety.  Q15 minute safety checks maintained.  R: Pt is safe on the unit.  Pt is compliant with medications.  Pt verbally contracts for safety.  Will continue to monitor and assess.

## 2017-11-22 NOTE — H&P (Signed)
Psychiatric Admission Assessment Adult  Patient Identification: Jason Bean MRN:  [id number] Date of Evaluation:  11/22/2017 Chief Complaint:  " I need help for my alcohol problem , I need to detox" Principal Diagnosis: Alcohol Dependence, Alcohol Induced Mood Disorder versus MDD Diagnosis:   Patient Active Problem List   Diagnosis Date Noted  . MDD (major depressive disorder), severe (HCC) [F32.2] 11/22/2017  . Alcohol abuse with intoxication (HCC) [F10.129]   . Severe recurrent major depression without psychotic features (HCC) [F33.2] 06/13/2017  . Alcohol dependence (HCC) [F10.20] 11/27/2012  . GERD (gastroesophageal reflux disease) [K21.9] 02/12/2012  . Hypertension [I10] 01/06/2012  . COPD (chronic obstructive pulmonary disease) (HCC) [J44.9] 01/06/2012  . Hyperlipidemia [E78.5] 01/06/2012  . Anxiety [F41.9] 01/06/2012  . Neck pain, chronic [M54.2, G89.29] 01/06/2012   History of Present Illness: 56 year old male , known to our unit from recent admission. Presented to the ED voluntarily, reporting depression, suicidal ideations. Patient states " I get like that when I am drinking, what I really needed was help to detox". Denies suicidal ideations at this time, and states " I just said those things because I was shaking and needed to get help". Of note he is known to our unit from recent admission form 12/20 - 12/24 . At the time presented with alcohol intoxication, reported xanax overdose and reporting auditory hallucinations. He was diagnosed with alcohol dependence, depression.   Was detoxified with Ativan protocol. He was discharged on 12/24 on Prozac, Trileptal, Minipress. Patient states that he stopped taking medications after his discharge . States he relapsed on alcohol about 4-5 days ago, drinking about 5 24 ounce beers per day. States he has not been taking any BZDs recently  ( chart notes indicate he had been abusing Xanax prior to his earlier admission)  States " I just  started feeling worse ".  Endorses some neuro-vegetative symptoms as below. Of note, denies suicidal ideations, and states that he made suicidal statements in ED because he was intoxicated .     Associated Signs/Symptoms: Depression Symptoms:  depressed mood, insomnia, loss of energy/fatigue, decreased appetite, (Hypo) Manic Symptoms:   No  Anxiety Symptoms:  Reports he has frequent panic attacks  Psychotic Symptoms:  Denies  PTSD Symptoms: Denies  Total Time spent with patient: 45 minutes  Past Psychiatric History: reports several psychiatric admissions, for alcohol dependence and depression.  Denies any history of suicidal attempts, denies history of self injurious ideations. Denies history of mania .  Denies history of psychosis, other than possibly having time limited auditory hallucinations when intoxicated with alcohol.  Endorses history of panic attacks and agoraphobia.   Is the patient at risk to self? Yes.    Has the patient been a risk to self in the past 6 months? Yes.    Has the patient been a risk to self within the distant past? No.  Is the patient a risk to others? No.  Has the patient been a risk to others in the past 6 months? No.  Has the patient been a risk to others within the distant past? No.   Prior Inpatient Therapy: Prior Inpatient Therapy: Yes Prior Therapy Dates: 05/2017; 10/2017  Prior Therapy Facilty/Provider(s): [facility] Reason for Treatment: SI; Alcoholism Prior Outpatient Therapy: Prior Outpatient Therapy: Yes Prior Therapy Dates: Unknown Does patient have an ACCT team?: No Does patient have Intensive In-House Services?  : No Does patient have [facility] services? : No Does patient have P4CC services?: No  Alcohol Screening: 3.  How often do you have six or more drinks on one occasion?: (varies) Intervention/Follow-up: Alcohol Education Substance Abuse History in the last 12 months:  Patient reports history of alcohol use disorder, states he has had  long episodes of sobriety, up to 3-4 years at one time. He states he relapsed a few weeks ago. Denies other drug abuse and at this time denies BZD abuse .  Consequences of Substance Abuse: History of a withdrawal seizure 2-3 years ago, history of alcohol blackouts, reports history of DUIs  Previous Psychotropic Medications: most recently prescribed Prozac, Minipress, Trileptal- has not been taking them since his prior discharge on 12/24. Of note, Trileptal was prescribed for Mood Disorder, NOT for history of seizure, and states he does not take this medication regularly . Psychological Evaluations: No  Past Medical History:  Past Medical History:  Diagnosis Date  . Arthritis   . COPD (chronic obstructive pulmonary disease) (HCC)   . Depression   . Hyperlipidemia   . Hypertension     Past Surgical History:  Procedure Laterality Date  . BACK SURGERY    . NECK SURGERY     Family History: both parents deceased, mother died from complications of COPD, father died of unknown respiratory illness, no siblings . Family Psychiatric  History: no history of psychiatric illness in family, no suicides in family, no history of alcohol abuse in family Tobacco Screening: Have you used any form of tobacco in the last 30 days? (Cigarettes, Smokeless Tobacco, Cigars, and/or Pipes): Yes Tobacco use, Select all that apply: 5 or more cigarettes per day Are you interested in Tobacco Cessation Medications?: No, patient refused Counseled patient on smoking cessation including recognizing danger situations, developing coping skills and basic information about quitting provided: Refused/Declined practical counseling Social History: single, has one adult daughter, lives alone, on disability , denies legal issues . Social History   Substance and Sexual Activity  Alcohol Use Yes   Comment: Pt drinks daily . unknown amount pt. reports "about 5-6 40 oz beers daily over last week     Social History   Substance and  Sexual Activity  Drug Use Yes  . Types: Benzodiazepines   Comment: pt. reports "alprazolam 0.5 TID    Additional Social History: Marital status: Single    Pain Medications: N/A Prescriptions: Atorvatatin Calcium, Fluticasone/Vilanterol, Montelucast Sodium, Albuterol, Alprazolam, Flonase, Robaxin 750, Requip, Folic Acid, Thiamine Over the Counter: Unknown History of alcohol / drug use?: Yes Longest period of sobriety (when/how long): 3.5 years/@ 5 months ago Withdrawal Symptoms: Seizures, Irritability, Diarrhea, Nausea / Vomiting, Tremors Date of most recent seizure: doesn't recall Name of Substance 1: ETOH 1 - Age of First Use: Unknown 1 - Amount (size/oz): Varies 1 - Frequency: Daily over the last several days 1 - Duration: unknown 1 - Last Use / Amount: 11/21/2017 (BAL was 210 upon arrival to [facility])  Allergies:  No Known Allergies Lab Results: No results found for this or any previous visit (from the past 48 hour(s)).  Blood Alcohol level:  Lab Results  Component Value Date   ETH 316 (H) 11/26/2012   ETH 212 (H) 03/09/2012    Metabolic Disorder Labs:  Lab Results  Component Value Date   HGBA1C 5.8 (H) 11/13/2017   MPG 119.76 11/13/2017   MPG 123 06/14/2017   No results found for: PROLACTIN Lab Results  Component Value Date   CHOL 146 11/13/2017   TRIG 237 (H) 11/13/2017   HDL 82 11/13/2017   CHOLHDL 1.8 11/13/2017  VLDL 47 (H) 11/13/2017   LDLCALC 17 11/13/2017   LDLCALC 47 06/14/2017    Current Medications: Current Facility-Administered Medications  Medication Dose Route Frequency Provider Last Rate Last Dose  . acetaminophen (TYLENOL) tablet 650 mg  650 mg Oral Q6H PRN Money, Gerlene Burdock, FNP      . albuterol (PROVENTIL HFA;VENTOLIN HFA) 108 (90 Base) MCG/ACT inhaler 2 puff  2 puff Inhalation Q6H PRN Money, Gerlene Burdock, FNP      . alum & mag hydroxide-simeth (MAALOX/MYLANTA) 200-200-20 MG/5ML suspension 30 mL  30 mL Oral Q4H PRN Money, Gerlene Burdock, FNP      .  chlordiazePOXIDE (LIBRIUM) capsule 25 mg  25 mg Oral Q6H PRN Money, Gerlene Burdock, FNP      . chlordiazePOXIDE (LIBRIUM) capsule 25 mg  25 mg Oral QID Money, Gerlene Burdock, FNP   25 mg at 11/22/17 1418   Followed by  . [START ON 11/23/2017] chlordiazePOXIDE (LIBRIUM) capsule 25 mg  25 mg Oral TID Money, Gerlene Burdock, FNP       Followed by  . [START ON 11/24/2017] chlordiazePOXIDE (LIBRIUM) capsule 25 mg  25 mg Oral BH-qamhs Money, Gerlene Burdock, FNP       Followed by  . [START ON 11/26/2017] chlordiazePOXIDE (LIBRIUM) capsule 25 mg  25 mg Oral Daily Money, Gerlene Burdock, FNP      . hydrOXYzine (ATARAX/VISTARIL) tablet 25 mg  25 mg Oral TID PRN Money, Gerlene Burdock, FNP      . loperamide (IMODIUM) capsule 2-4 mg  2-4 mg Oral PRN Money, Gerlene Burdock, FNP      . magnesium hydroxide (MILK OF MAGNESIA) suspension 30 mL  30 mL Oral Daily PRN Money, Gerlene Burdock, FNP      . multivitamin with minerals tablet 1 tablet  1 tablet Oral Daily Money, Gerlene Burdock, FNP   1 tablet at 11/22/17 1417  . nicotine (NICODERM CQ - dosed in mg/24 hours) patch 21 mg  21 mg Transdermal Daily Money, Feliz Beam B, FNP   21 mg at 11/22/17 1418  . ondansetron (ZOFRAN-ODT) disintegrating tablet 4 mg  4 mg Oral Q6H PRN Money, Gerlene Burdock, FNP   4 mg at 11/22/17 1418  . [START ON 11/23/2017] thiamine (VITAMIN B-1) tablet 100 mg  100 mg Oral Daily Money, Gerlene Burdock, FNP       PTA Medications: Medications Prior to Admission  Medication Sig Dispense Refill Last Dose  . albuterol (PROVENTIL HFA;VENTOLIN HFA) 108 (90 Base) MCG/ACT inhaler Inhale 2 puffs into the lungs every 6 (six) hours as needed. :For shortness of breath.     Marland Kitchen atorvastatin (LIPITOR) 20 MG tablet Take 1 tablet (20 mg total) by mouth daily at 6 PM. For high cholesterol 30 tablet 0   . FLUoxetine (PROZAC) 20 MG capsule Take 1 capsule (20 mg total) by mouth daily. For depression 30 capsule 0   . hydrOXYzine (ATARAX/VISTARIL) 50 MG tablet Take 1 tablet (50 mg total) by mouth every 6 (six) hours as needed for anxiety. 60  tablet 0   . mometasone-formoterol (DULERA) 200-5 MCG/ACT AERO Inhale 2 puffs into the lungs 2 (two) times daily. For wheezing/shortness of breath. 1 Inhaler 3   . nicotine (NICODERM CQ - DOSED IN MG/24 HOURS) 21 mg/24hr patch Place 1 patch (21 mg total) onto the skin daily. (May buy from over the counter): For smoking cessation 28 patch 0   . Oxcarbazepine (TRILEPTAL) 300 MG tablet Take 1 tablet (300 mg total) by mouth 2 (two) times daily.  For mood stabilization 60 tablet 0   . prazosin (MINIPRESS) 1 MG capsule Take 1 capsule (1 mg total) by mouth at bedtime. For nightmares 30 capsule 0   . tiotropium (SPIRIVA) 18 MCG inhalation capsule Place 1 capsule (18 mcg total) into inhaler and inhale daily. For Asthma 1 capsule 0   . traZODone (DESYREL) 100 MG tablet Take 1 tablet (100 mg total) by mouth at bedtime as needed for sleep. 30 tablet 0     Musculoskeletal: Strength & Muscle Tone: within normal limits- mild to moderate distal tremors , no diaphoresis Gait & Station: normal Patient leans: N/A  Psychiatric Specialty Exam: Physical Exam  Review of Systems  Constitutional: Negative.   HENT: Negative.   Eyes: Negative.   Respiratory: Negative.   Cardiovascular: Negative.   Gastrointestinal: Positive for nausea.  Genitourinary: Negative.   Musculoskeletal: Negative.   Skin: Negative.   Neurological: Positive for seizures.       Reports history of alcohol WDL seizure 2-3 years ago  Endo/Heme/Allergies: Negative.   Psychiatric/Behavioral: Positive for depression, substance abuse and suicidal ideas.  All other systems reviewed and are negative.   Blood pressure 132/80, pulse 88, temperature 97.8 F (36.6 C), temperature source Oral, resp. rate 18, height 5' 7.5" (1.715 m), weight 71.1 kg (156 lb 12 oz).Body mass index is 24.19 kg/m.  General Appearance: Fairly Groomed  Eye Contact:  Fair  Speech:  Normal Rate  Volume:  Normal  Mood:  vaguely depressed, anxious  Affect:  anxious,  constricted   Thought Process:  Linear and Descriptions of Associations: Intact  Orientation:  Other:  fully alert and attentive, fully oriented x 3- no current presentation of confusion or delirium  Thought Content:  no hallucinations, no delusions, not internally preoccupied   Suicidal Thoughts:  No denies suicidal or self injurious ideations, denies any homicidal ideations  Homicidal Thoughts:  No  Memory:  recent and remote grossly intact   Judgement:  Fair  Insight:  Fair  Psychomotor Activity:  slightly restless and tremulous   Concentration:  Concentration: Fair and Attention Span: Fair  Recall:  Good  Fund of Knowledge:  Good  Language:  Good  Akathisia:  Negative  Handed:  Right  AIMS (if indicated):     Assets:  Desire for Improvement Resilience  ADL's:  Intact  Cognition:  WNL  Sleep:       Treatment Plan Summary: Daily contact with patient to assess and evaluate symptoms and progress in treatment, Medication management, Plan inpatient treatment and medications as below   Observation Level/Precautions:  15 minute checks  Laboratory:  as needed   Psychotherapy:  Milieu, group therapy   Medications:  We discussed options- alcohol detox protocol warranted to minimize risk of WDL and of WDL seizure. Of note patient adamantly states Librium does not work well for him , states Ativan more effective for detox. Ativan was used for detox protocol on his last admission. Will D/C Librium and start Ativan detox Protocol. We discussed restarting Prozac for depression, anxiety, at this time states his psychiatric symptoms are related to alcohol and if he can be detoxed and remain sober he does not think he will need psychiatric medication management .  Consultations:  As needed   Discharge Concerns:-    Estimated LOS: 5 days   Other:     Physician Treatment Plan for Primary Diagnosis:  Alcohol Dependence Long Term Goal(s): Improvement in symptoms so as ready for  discharge  Short Term Goals: Ability  to identify triggers associated with substance abuse/mental health issues will improve  Physician Treatment Plan for Secondary Diagnosis: Alcohol Induced Mood Disorder versus MDD   Long Term Goal(s): Improvement in symptoms so as ready for discharge  Short Term Goals: Ability to identify changes in lifestyle to reduce recurrence of condition will improve, Ability to verbalize feelings will improve, Ability to disclose and discuss suicidal ideas, Ability to demonstrate self-control will improve, Ability to identify and develop effective coping behaviors will improve and Ability to maintain clinical measurements within normal limits will improve  I certify that inpatient services furnished can reasonably be expected to improve the patient's condition.    Craige Cotta, MD 12/30/20183:58 PM

## 2017-11-22 NOTE — Progress Notes (Addendum)
D) Pt. Is 56 year old male requesting help for SI and alcohol withdrawal. Pt. Reportedly called EMS with SI plan to OD on Xanax.  Pt. Currently states "I can't do it anymore".  Pt. Was admitted for similar issue on 12/21.  Pt. States that he became involved with the people in his apartment complex and began drinking again.  Pt. Reports he has been drinking 5-6 40 oz beer daily for one week. When asked about recent alcohol consumption and pt. Stated "I drank a whole table full".  Pt. Presents with medical history of hypertension, asthma, COPD, acid reflux, and alcohol related seizures.  Pt. Has evidence of significant tremors and reports tactile discomfort, nausea, vomiting, and diarrhea.  Pt. Unable to identify any stressor for returning to drinking other than "stress".  Pt. Reports that he is very uncomfortable with his withdrawal symptoms. Reports a period of 7-8 months of sobriety in which he has enjoyed volunteer work, but relapsed less that 2 weeks ago and did not maintain sobriety when discharged stating "I didn't go to any meetings". Pt. Has a 54 year old daughter who lives in [place] and has been reportedly in a methadone program for heroin addiction "over one year" and "is doing well".   A) Pt. Offered support and reorientation to unit.  Pt. Offered comfort measures and provider notified of patient's level of discomfort. R) Pt. Receptive and somewhat irritable wanting to go to the unit.  Pt. Remains safe at this time.

## 2017-11-22 NOTE — Plan of Care (Signed)
  Progressing Safety: Periods of time without injury will increase 11/22/2017 2323 - Progressing by Arrie Aranhurch, Tauno Falotico J, RN Note Pt has not harmed self or others tonight.  He denies SI/HI and verbally contracts for safety.
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
## 2017-11-22 NOTE — [facility] Suicide Risk Assessment (Signed)
[facility]   Nursing information obtained from:   patient and chart Demographic factors:   56 year old male, single, one adult daughter, lives alone Current Mental Status:   see below Loss Factors:   relapse on alcohol , limited support network Historical Factors:   history of alcohol dependence, prior admissions for detox and for alcohol induced mood disorder Risk Reduction Factors:   resilience   Total Time spent with patient: 45 minutes Principal Problem:  Alcohol Dependence , Alcohol WDL, Alcohol Induced Mood Disorder  Diagnosis:   Patient Active Problem List   Diagnosis Date Noted  . MDD (major depressive disorder), severe (HCC) [F32.2] 11/22/2017  . Alcohol abuse with intoxication (HCC) [F10.129]   . Severe recurrent major depression without psychotic features (HCC) [F33.2] 06/13/2017  . Alcohol dependence (HCC) [F10.20] 11/27/2012  . GERD (gastroesophageal reflux disease) [K21.9] 02/12/2012  . Hypertension [I10] 01/06/2012  . COPD (chronic obstructive pulmonary disease) (HCC) [J44.9] 01/06/2012  . Hyperlipidemia [E78.5] 01/06/2012  . Anxiety [F41.9] 01/06/2012  . Neck pain, chronic [M54.2, G89.29] 01/06/2012    Continued Clinical Symptoms:    The "Alcohol Use Disorders Identification Test", Guidelines for Use in Primary Care, Second Edition.  World Science writerHealth Organization [facility](WHO). Score between 0-7:  no or low risk or alcohol related problems. Score between 8-15:  moderate risk of alcohol related problems. Score between 16-19:  high risk of alcohol related problems. Score 20 or above:  warrants further diagnostic evaluation for alcohol dependence and treatment.   CLINICAL FACTORS:   56 year old male, history of alcohol dependence, presented to ED reporting depression, SI, psychotic symptoms. States his recollection of going to ED is minimal as he was in alcohol blackout. States he is not suicidal, and that his major issue is alcohol dependence. [facility]  [facility] , detoxed , relapsed after discharge. Vitals are stable, but presents with tremors, some restlessness, and has a history of a prior withdrawal seizure.   Psychiatric Specialty Exam: Physical Exam  ROS  Blood pressure 132/80, pulse 88, temperature 97.8 F (36.6 C), temperature source Oral, resp. rate 18, height 5' 7.5" (1.715 m), weight 71.1 kg (156 lb 12 oz).Body mass index is 24.19 kg/m.   see admit note MSE    COGNITIVE FEATURES THAT CONTRIBUTE TO RISK:  Closed-mindedness and Loss of executive function    SUICIDE RISK:   Moderate:  Frequent suicidal ideation with limited intensity, and duration, some specificity in terms of plans, no associated intent, good self-control, limited dysphoria/symptomatology, some risk factors present, and identifiable protective factors, including available and accessible social support.  PLAN OF CARE: Patient will be admitted to inpatient psychiatric unit for stabilization and safety. Will provide and encourage milieu participation. Provide medication management and maked adjustments as needed. Will also provide medication management to minimize risk of WDL.  Will follow daily.    I certify that inpatient services furnished can reasonably be expected to improve the patient's condition.   Craige CottaFernando A Mirabel Ahlgren, MD 11/22/2017, 4:35 PM

## 2017-11-22 NOTE — Progress Notes (Signed)
Adult Psychoeducational Group Note  Date:  11/22/2017 Time:  10:48 PM  Group Topic/Focus:  Wrap-Up Group:   The focus of this group is to help patients review their daily goal of treatment and discuss progress on daily workbooks.  Participation Level:  Minimal  Participation Quality:  Inattentive  Affect:  Flat  Cognitive:  Alert  Insight: None  Engagement in Group:  Limited  Modes of Intervention:  Discussion, Education and Socialization  Additional Comments:  Pt attended this evening's wrap up group and stated he had only been here for a day. Pt was no willing to share any other information at this time.  Malachy MoanJeffers, Korena Nass S 11/22/2017, 10:48 PM

## 2017-11-22 NOTE — BH Assessment (Addendum)
Assessment Note  Jason Bean is an 56 y.o. male.   Assessment narrative completed on 11/21/2017 by TTS Jason Bean:  Pt is a 57 year old single male who presents voluntarily to [facility] (ED). Pt states that he is suicidal. Pt denies HI, Pt reports auditory hallucinations. Pt reports history of mental health illness and has been feeling increasingly depressed within the last few days. Pt states he has a plan to take all his Xanax, Pt states " I do not want to live" "pt stated "the pills will put me to sleep and I probably wouldn't wake up". When asked does pt have access to any weapons, pt stated that he has access to a handgun (9 millimeter). Pt stated "something has to give". Pt acknowledges symptoms of crying spells, sadness, hopelessness, difficulty concentrating ,decreased sleep and appetite. Pt stated " I just drank myself to sleep last night" Pt stated that he has been drinking for several years and that he consumes alcohol daily. Pt denies any recent manic episodes, however pt states that he hears voices stating " I need to run". Pt states that he only remembers one other suicide attempt in his 20's of him cutting his wrists.   Pt identifies primary stressor as stress "life in general". Pt states that he worries excessively about what his daughters think about him and the choices that he is making. Pt denies any family history of suicide or use of drugs or alcohol. Pt denies having any current legal problems.   Pt stated that he was recently hospitalized for his mental health at [facility] two weeks ago. Pt stated that he was also hospitalized at [facility] a few years back. Pt is dressed in scrubs, alert, oriented X4 with normal speech, free of movement. Eye contact is good and pt appears to be hopeless. Thought process is coherent and relevant. Pt's mood is depressed and anxious. Pt's insight and judgement is poor. Pt was cooperative throughout assessment. Pt stated he does not  feel safe returning back to his apartment at this time and does not want to go there.     Diagnosis: MDD, recurrent episode, severe; Alcohol Use d/o, severe  Past Medical History:  Past Medical History:  Diagnosis Date  . Arthritis   . COPD (chronic obstructive pulmonary disease) (HCC)   . Depression   . Hyperlipidemia   . Hypertension     Past Surgical History:  Procedure Laterality Date  . BACK SURGERY    . NECK SURGERY      Family History: History reviewed. No pertinent family history.  Social History:  reports that he has been smoking cigarettes.  He has a 30.00 pack-year smoking history. he has never used smokeless tobacco. He reports that he drinks alcohol. He reports that he uses drugs. Drug: Benzodiazepines.  Additional Social History:  Alcohol / Drug Use Pain Medications: N/A Prescriptions: Atorvatatin Calcium, Fluticasone/Vilanterol, Montelucast Sodium, Albuterol, Alprazolam, Flonase, Robaxin 750, Requip, Folic Acid, Thiamine Over the Counter: Unknown History of alcohol / drug use?: Yes Longest period of sobriety (when/how long): 3.5 years/@ 5 months ago Withdrawal Symptoms: Seizures, Irritability, Diarrhea, Nausea / Vomiting, Tremors Date of most recent seizure: doesn't recall Substance #1 Name of Substance 1: ETOH 1 - Age of First Use: Unknown 1 - Amount (size/oz): Varies 1 - Frequency: Daily over the last several days 1 - Duration: unknown 1 - Last Use / Amount: 11/21/2017 (BAL was 210 upon arrival to [place])  CIWA: CIWA-Ar BP: 132/80 Pulse  Rate: 88 Nausea and Vomiting: intermittent nausea with dry heaves Tactile Disturbances: mild itching, pins and needles, burning or numbness Tremor: five Auditory Disturbances: not present Paroxysmal Sweats: no sweat visible Visual Disturbances: very mild sensitivity Anxiety: moderately anxious, or guarded, so anxiety is inferred Headache, Fullness in Head: mild Agitation: somewhat more than normal  activity Orientation and Clouding of Sensorium: oriented and can do serial additions CIWA-Ar Total: 19 COWS:    Allergies: No Known Allergies  Home Medications:  Medications Prior to Admission  Medication Sig Dispense Refill  . albuterol (PROVENTIL HFA;VENTOLIN HFA) 108 (90 Base) MCG/ACT inhaler Inhale 2 puffs into the lungs every 6 (six) hours as needed. :For shortness of breath.    Marland Kitchen. atorvastatin (LIPITOR) 20 MG tablet Take 1 tablet (20 mg total) by mouth daily at 6 PM. For high cholesterol 30 tablet 0  . FLUoxetine (PROZAC) 20 MG capsule Take 1 capsule (20 mg total) by mouth daily. For depression 30 capsule 0  . hydrOXYzine (ATARAX/VISTARIL) 50 MG tablet Take 1 tablet (50 mg total) by mouth every 6 (six) hours as needed for anxiety. 60 tablet 0  . mometasone-formoterol (DULERA) 200-5 MCG/ACT AERO Inhale 2 puffs into the lungs 2 (two) times daily. For wheezing/shortness of breath. 1 Inhaler 3  . nicotine (NICODERM CQ - DOSED IN MG/24 HOURS) 21 mg/24hr patch Place 1 patch (21 mg total) onto the skin daily. (May buy from over the counter): For smoking cessation 28 patch 0  . Oxcarbazepine (TRILEPTAL) 300 MG tablet Take 1 tablet (300 mg total) by mouth 2 (two) times daily. For mood stabilization 60 tablet 0  . prazosin (MINIPRESS) 1 MG capsule Take 1 capsule (1 mg total) by mouth at bedtime. For nightmares 30 capsule 0  . tiotropium (SPIRIVA) 18 MCG inhalation capsule Place 1 capsule (18 mcg total) into inhaler and inhale daily. For Asthma 1 capsule 0  . traZODone (DESYREL) 100 MG tablet Take 1 tablet (100 mg total) by mouth at bedtime as needed for sleep. 30 tablet 0    OB/GYN Status:  No LMP for male patient.  General Assessment Data Location of Assessment: [facility] Assessment Services TTS Assessment: Out of system Is this a Tele or Face-to-Face Assessment?: Tele Assessment Is this an Initial Assessment or a Re-assessment for this encounter?: Initial Assessment Marital status: Single Living  Arrangements: Alone Can pt return to current living arrangement?: Yes Admission Status: Voluntary Is patient capable of signing voluntary admission?: Yes Referral Source: Self/Family/Friend     Crisis Care Plan Living Arrangements: Alone Name of Psychiatrist: None Name of Therapist: None  Education Status Is patient currently in school?: No  Risk to self with the past 6 months Suicidal Ideation: Yes-Currently Present Has patient been a risk to self within the past 6 months prior to admission? : Yes Suicidal Intent: Yes-Currently Present Has patient had any suicidal intent within the past 6 months prior to admission? : Yes Is patient at risk for suicide?: Yes Suicidal Plan?: Yes-Currently Present Has patient had any suicidal plan within the past 6 months prior to admission? : Yes Specify Current Suicidal Plan: OD on Xanax Access to Means: Yes Specify Access to Suicidal Means: has rx Xanax What has been your use of drugs/alcohol within the last 12 months?: ETOH Previous Attempts/Gestures: Yes Triggers for Past Attempts: Unknown Intentional Self Injurious Behavior: None Family Suicide History: Unknown Recent stressful life event(s): Turmoil (Comment) Persecutory voices/beliefs?: Yes Depression: Yes Depression Symptoms: Despondent, Feeling worthless/self pity, Feeling angry/irritable Substance abuse history and/or  treatment for substance abuse?: Yes Suicide prevention information given to non-admitted patients: Not applicable  Risk to Others within the past 6 months Homicidal Ideation: No Does patient have any lifetime risk of violence toward others beyond the six months prior to admission? : No Thoughts of Harm to Others: No Current Homicidal Intent: No Current Homicidal Plan: No Access to Homicidal Means: No History of harm to others?: No Assessment of Violence: None Noted Does patient have access to weapons?: No Criminal Charges Pending?: No Does patient have a court  date: No Is patient on probation?: No  Psychosis Hallucinations: None noted Delusions: None noted  Mental Status Report Appearance/Hygiene: Disheveled Eye Contact: Fair Speech: Logical/coherent Level of Consciousness: Alert Mood: Depressed Affect: Appropriate to circumstance Anxiety Level: Moderate Thought Processes: Coherent, Relevant Judgement: Impaired Orientation: Person, Place, Time, Situation Obsessive Compulsive Thoughts/Behaviors: None  Cognitive Functioning Concentration: Normal Memory: Recent Intact, Remote Intact IQ: Average Insight: Poor Impulse Control: Fair Appetite: Good Sleep: No Change Vegetative Symptoms: None  ADLScreening [facility]) Patient's cognitive ability adequate to safely complete daily activities?: Yes Patient able to express need for assistance with ADLs?: Yes Independently performs ADLs?: Yes (appropriate for developmental age)  Prior Inpatient Therapy Prior Inpatient Therapy: Yes Prior Therapy Dates: 05/2017; 10/2017  [facility] Reason for Treatment: SI; Alcoholism  Prior Outpatient Therapy Prior Outpatient Therapy: Yes Prior Therapy Dates: Unknown Does patient have an ACCT team?: No Does patient have Intensive In-House Services?  : No Does patient have [facility] services? : No Does patient have P4CC services?: No  ADL Screening (condition at time of admission) Patient's cognitive ability adequate to safely complete daily activities?: Yes Is the patient deaf or have difficulty hearing?: No Does the patient have difficulty seeing, even when wearing glasses/contacts?: No Does the patient have difficulty concentrating, remembering, or making decisions?: No Patient able to express need for assistance with ADLs?: Yes Does the patient have difficulty dressing or bathing?: No Independently performs ADLs?: Yes (appropriate for developmental age) Does the patient have difficulty walking or climbing  stairs?: No Weakness of Legs: None Weakness of Arms/Hands: None  Home Assistive Devices/Equipment Home Assistive Devices/Equipment: None  Therapy Consults (therapy consults require a physician order) PT Evaluation Needed: No OT Evalulation Needed: No SLP Evaluation Needed: No Abuse/Neglect Assessment (Assessment to be complete while patient is alone) Physical Abuse: Denies Verbal Abuse: Denies Sexual Abuse: Denies Exploitation of patient/patient's resources: Denies Self-Neglect: Yes, present (Comment)(been drinking, not eating) Values / Beliefs Cultural Requests During Hospitalization: None Spiritual Requests During Hospitalization: None Consults Spiritual Care Consult Needed: No Social Work Consult Needed: No Merchant navy officerAdvance Directives (For Healthcare) Does Patient Have a Medical Advance Directive?: No Would patient like information on creating a medical advance directive?: No - Patient declined Nutrition Screen- [facility] Adult/[facility]/AP Patient's home diet: Regular Has the patient recently lost weight without trying?: No Has the patient been eating poorly because of a decreased appetite?: Yes Malnutrition Screening Tool Score: 1  Additional Information 1:1 In Past 12 Months?: No CIRT Risk: No Elopement Risk: No Does patient have medical clearance?: Yes     Disposition:  Disposition Initial Assessment Completed for this Encounter: Yes(case staffed with Assunta FoundShuvon Rankin, NP) Disposition of Patient: Inpatient treatment program Type of inpatient treatment program: Adult([facility]  On Site Evaluation by:   Reviewed with Physician:    Laddie AquasSamantha M Albert Devaul 11/22/2017 3:02 PM

## 2017-11-22 NOTE — [facility] Suicide Risk Assessment (Signed)
[facility] INPATIENT:  Family/Significant Other Suicide Prevention Education  Suicide Prevention Education:  Patient Refusal for Family/Significant Other Suicide Prevention Education: The patient Jason Bean has refused to provide written consent for family/significant other to be provided Family/Significant Other Suicide Prevention Education during admission and/or prior to discharge.  Physician notified.  Assessment does state that patient has access to a 9mm handgun, which he will not discuss currently.  Carloyn JaegerMareida J Grossman-Orr 11/22/2017, 3:42 PM

## 2017-11-22 NOTE — Tx Team (Signed)
Initial Treatment Plan 11/22/2017 7:35 PM Jason QuantJames W Hinz WUJ:[id number]    PATIENT STRESSORS:  Substance abuse   PATIENT STRENGTHS: Average or above average intelligence Capable of independent living General fund of knowledge   PATIENT IDENTIFIED PROBLEMS: Alcohol abuse  Suicidal ideation when inebriated                   DISCHARGE CRITERIA:  Ability to meet basic life and health needs Improved stabilization in mood, thinking, and/or behavior Motivation to continue treatment in a less acute level of care Reduction of life-threatening or endangering symptoms to within safe limits Verbal commitment to aftercare and medication compliance  PRELIMINARY DISCHARGE PLAN: Attend 12-step recovery group Outpatient therapy  PATIENT/FAMILY INVOLVEMENT: This treatment plan has been presented to and reviewed with the patient, Jason Bean, and/or family member, none.  The patient and family have been given the opportunity to ask questions and make suggestions.  Delila PereyraMichels, Dantae Meunier Louise, RN 11/22/2017, 7:35 PM

## 2017-11-23 DIAGNOSIS — R450 Nervousness: Secondary | ICD-10-CM

## 2017-11-23 DIAGNOSIS — F1020 Alcohol dependence, uncomplicated: Secondary | ICD-10-CM

## 2017-11-23 DIAGNOSIS — F1310 Sedative, hypnotic or anxiolytic abuse, uncomplicated: Secondary | ICD-10-CM

## 2017-11-23 DIAGNOSIS — F332 Major depressive disorder, recurrent severe without psychotic features: Secondary | ICD-10-CM

## 2017-11-23 NOTE — Progress Notes (Signed)
Patient ID: Jason Bean, male   DOB: 06-Aug-1961, 56 y.o.   MRN: [id number]  Pt currently presents with a worried affect and anxious behavior. Pt ruminates about discharge and [facility].  Pt perception is negative and thinking is dichotomous. Pt believes that he will be discharged to walk "all the way home to the other side of [place]." Pt reports good sleep with current medication regimen. Presents with signs and symptoms of withdrawal including headache, fatigue, agitation, anxiety, insomnia and diarrhea.    Pt provided with scheduled and as needed medications per providers orders. Pt's labs and vitals were monitored throughout the night. Pt given a 1:1 about emotional and mental status. Pt supported and encouraged to express concerns and questions. Pt educated on medications and [facility] discharge procedures. Pt encouraged to speak with CSW or [facility] LPC about a cab voucher at the time of discharge. Pt encouraged to use healthy coping skills and CBT to manage current ongoing anxiety.   Pt's safety ensured with 15 minute and environmental checks. Pt currently denies SI/HI and A/V hallucinations. Pt verbally agrees to seek staff if SI/HI or A/VH occurs and to consult with staff before acting on any harmful thoughts. Will continue POC.

## 2017-11-23 NOTE — Tx Team (Signed)
Interdisciplinary Treatment and Diagnostic Plan Update  11/23/2017 Time of Session: [place] MRN: [id number]  Principal Diagnosis: <principal problem not specified>  Secondary Diagnoses: Active Problems:   MDD (major depressive disorder), severe (HCC)   Current Medications:  Current Facility-Administered Medications  Medication Dose Route Frequency Provider Last Rate Last Dose  . acetaminophen (TYLENOL) tablet 650 mg  650 mg Oral Q6H PRN Money, Lowry Ram, FNP   650 mg at 11/23/17 6295  . albuterol (PROVENTIL HFA;VENTOLIN HFA) 108 (90 Base) MCG/ACT inhaler 2 puff  2 puff Inhalation Q6H PRN Money, Lowry Ram, FNP      . alum & mag hydroxide-simeth (MAALOX/MYLANTA) 200-200-20 MG/5ML suspension 30 mL  30 mL Oral Q4H PRN Money, Lowry Ram, FNP      . hydrOXYzine (ATARAX/VISTARIL) tablet 25 mg  25 mg Oral Q6H PRN Cobos, Myer Peer, MD   25 mg at 11/23/17 2841  . loperamide (IMODIUM) capsule 2-4 mg  2-4 mg Oral PRN Cobos, Myer Peer, MD   2 mg at 11/23/17 0926  . LORazepam (ATIVAN) tablet 1 mg  1 mg Oral Q6H PRN Cobos, Myer Peer, MD      . LORazepam (ATIVAN) tablet 1 mg  1 mg Oral QID Cobos, Myer Peer, MD   1 mg at 11/23/17 0810   Followed by  . LORazepam (ATIVAN) tablet 1 mg  1 mg Oral TID Cobos, Myer Peer, MD       Followed by  . [START ON 11/24/2017] LORazepam (ATIVAN) tablet 1 mg  1 mg Oral BID Cobos, Myer Peer, MD       Followed by  . [START ON 11/26/2017] LORazepam (ATIVAN) tablet 1 mg  1 mg Oral Daily Cobos, Fernando A, MD      . magnesium hydroxide (MILK OF MAGNESIA) suspension 30 mL  30 mL Oral Daily PRN Money, Darnelle Maffucci B, FNP      . multivitamin with minerals tablet 1 tablet  1 tablet Oral Daily Cobos, Myer Peer, MD   1 tablet at 11/23/17 0810  . nicotine (NICODERM CQ - dosed in mg/24 hours) patch 21 mg  21 mg Transdermal Daily Money, Lowry Ram, FNP   21 mg at 11/23/17 0810  . ondansetron (ZOFRAN-ODT) disintegrating tablet 4 mg  4 mg Oral Q6H PRN Cobos, Myer Peer, MD   4 mg at  11/23/17 0926  . thiamine (VITAMIN B-1) tablet 100 mg  100 mg Oral Daily Cobos, Myer Peer, MD   100 mg at 11/23/17 3244   PTA Medications: Medications Prior to Admission  Medication Sig Dispense Refill Last Dose  . albuterol (PROVENTIL HFA;VENTOLIN HFA) 108 (90 Base) MCG/ACT inhaler Inhale 2 puffs into the lungs every 6 (six) hours as needed. :For shortness of breath.     Marland Kitchen atorvastatin (LIPITOR) 20 MG tablet Take 1 tablet (20 mg total) by mouth daily at 6 PM. For high cholesterol 30 tablet 0   . FLUoxetine (PROZAC) 20 MG capsule Take 1 capsule (20 mg total) by mouth daily. For depression 30 capsule 0   . hydrOXYzine (ATARAX/VISTARIL) 50 MG tablet Take 1 tablet (50 mg total) by mouth every 6 (six) hours as needed for anxiety. 60 tablet 0   . mometasone-formoterol (DULERA) 200-5 MCG/ACT AERO Inhale 2 puffs into the lungs 2 (two) times daily. For wheezing/shortness of breath. 1 Inhaler 3   . nicotine (NICODERM CQ - DOSED IN MG/24 HOURS) 21 mg/24hr patch Place 1 patch (21 mg total) onto the skin daily. (May buy from over  the counter): For smoking cessation 28 patch 0   . Oxcarbazepine (TRILEPTAL) 300 MG tablet Take 1 tablet (300 mg total) by mouth 2 (two) times daily. For mood stabilization 60 tablet 0   . prazosin (MINIPRESS) 1 MG capsule Take 1 capsule (1 mg total) by mouth at bedtime. For nightmares 30 capsule 0   . tiotropium (SPIRIVA) 18 MCG inhalation capsule Place 1 capsule (18 mcg total) into inhaler and inhale daily. For Asthma 1 capsule 0   . traZODone (DESYREL) 100 MG tablet Take 1 tablet (100 mg total) by mouth at bedtime as needed for sleep. 30 tablet 0     Patient Stressors: Substance abuse  Patient Strengths: Average or above average intelligence Capable of independent living General fund of knowledge  Treatment Modalities: Medication Management, Group therapy, Case management,  1 to 1 session with clinician, Psychoeducation, Recreational therapy.   Physician Treatment Plan  for Primary Diagnosis: <principal problem not specified> Long Term Goal(s): Improvement in symptoms so as ready for discharge Improvement in symptoms so as ready for discharge   Short Term Goals: Ability to identify triggers associated with substance abuse/mental health issues will improve Ability to identify changes in lifestyle to reduce recurrence of condition will improve Ability to verbalize feelings will improve Ability to disclose and discuss suicidal ideas Ability to demonstrate self-control will improve Ability to identify and develop effective coping behaviors will improve Ability to maintain clinical measurements within normal limits will improve  Medication Management: Evaluate patient's response, side effects, and tolerance of medication regimen.  Therapeutic Interventions: 1 to 1 sessions, Unit Group sessions and Medication administration.  Evaluation of Outcomes: Not Met  Physician Treatment Plan for Secondary Diagnosis: Active Problems:   MDD (major depressive disorder), severe ([place])  Long Term Goal(s): Improvement in symptoms so as ready for discharge Improvement in symptoms so as ready for discharge   Short Term Goals: Ability to identify triggers associated with substance abuse/mental health issues will improve Ability to identify changes in lifestyle to reduce recurrence of condition will improve Ability to verbalize feelings will improve Ability to disclose and discuss suicidal ideas Ability to demonstrate self-control will improve Ability to identify and develop effective coping behaviors will improve Ability to maintain clinical measurements within normal limits will improve     Medication Management: Evaluate patient's response, side effects, and tolerance of medication regimen.  Therapeutic Interventions: 1 to 1 sessions, Unit Group sessions and Medication administration.  Evaluation of Outcomes: Not Met   RN Treatment Plan for Primary Diagnosis:  <principal problem not specified> Long Term Goal(s): Knowledge of disease and therapeutic regimen to maintain health will improve  Short Term Goals: Ability to identify and develop effective coping behaviors will improve and Compliance with prescribed medications will improve  Medication Management: RN will administer medications as ordered by provider, will assess and evaluate patient's response and provide education to patient for prescribed medication. RN will report any adverse and/or side effects to prescribing provider.  Therapeutic Interventions: 1 on 1 counseling sessions, Psychoeducation, Medication administration, Evaluate responses to treatment, Monitor vital signs and CBGs as ordered, Perform/monitor CIWA, COWS, AIMS and Fall Risk screenings as ordered, Perform wound care treatments as ordered.  Evaluation of Outcomes: Not Met   LCSW Treatment Plan for Primary Diagnosis: <principal problem not specified> Long Term Goal(s): Safe transition to appropriate next level of care at discharge, Engage patient in therapeutic group addressing interpersonal concerns.  Short Term Goals: Engage patient in aftercare planning with referrals and resources, Increase social  support and Increase skills for wellness and recovery  Therapeutic Interventions: Assess for all discharge needs, 1 to 1 time with Social worker, Explore available resources and support systems, Assess for adequacy in community support network, Educate family and significant other(s) on suicide prevention, Complete Psychosocial Assessment, Interpersonal group therapy.  Evaluation of Outcomes: Not Met   Progress in Treatment: Attending groups: No. Participating in groups: No. Taking medication as prescribed: Yes. Toleration medication: Yes. Family/Significant other contact made: No, will contact:  pt declined Patient understands diagnosis: Yes. Discussing patient identified problems/goals with staff: Yes. Medical problems  stabilized or resolved: Yes. Denies suicidal/homicidal ideation: Yes. Issues/concerns per patient self-inventory: No. Other: none  New problem(s) identified: No, Describe:  none  New Short Term/Long Term Goal(s):  Discharge Plan or Barriers:   Reason for Continuation of Hospitalization: Depression Medication stabilization  Estimated Length of Stay: 3-5 days.  Attendees: Patient: 11/23/2017   Physician: Dr. Parke Poisson, MD 11/23/2017   Nursing: Mayra Neer, RN 11/23/2017   RN Care Manager: 11/23/2017   Social Worker: Lurline Idol, LCSW 11/23/2017   Recreational Therapist:  11/23/2017   Other:  11/23/2017   Other:  11/23/2017   Other: 11/23/2017        Scribe for Treatment Team: Joanne Chars, LCSW 11/23/2017 11:14 AM

## 2017-11-23 NOTE — Progress Notes (Signed)
Recreation Therapy Notes  Date: 11/23/17 Time: 0930 Location: 300 [address] Dayroom  Group Topic: Stress Management  Goal Area(s) Addresses:  Patient will verbalize importance of using healthy stress management.  Patient will identify positive emotions associated with healthy stress management.   Intervention: Stress Management  Activity :  Guided Imagery.  LRT introduced the stress management technique of guided imagery.  LRT read a script on letting go of things that hold us back.  Patients were to listen and follow along as LRT read script to engage in activity.  Education:  Stress Management, Discharge Planning.   Education Outcome: Acknowledges edcuation/In group clarification offered/Needs additional education  Clinical Observations/Feedback: Pt did not attend group.     Mandee Pluta Linday, LRT/CTRS     Caroll RancherLindsay, Dick Hark A 11/23/2017 11:25 AM

## 2017-11-23 NOTE — Progress Notes (Signed)
Data. Patient denies SI/HI/AVH. Verbally contracts for safety on the unit and to come to staff before acting of any self harm thoughts/feelings.  Patient very irritable and complaining of nausea, diarrhea and, "Detox real bad.". Patient complained about the nurse, "...with the blond hair. She has a real bad attitude. You all need to get your stories straight." Patient also stated, "These meds. aren't working. I just won't to go home. I just want to leave tomorrow morning. I asked what I want to do coming in here, and I said detox. Well I should be able to leave tomorrow morning. And [place]." Patient signed 72 hour request for discharge at 3:30pm. On his self assessment he reports 7/10 for depression and hopelessness and 8/10 for anxiety. His goal for today is, "Feel better, back to being myself." Action. Emotional support and encouragement offered. Education provided on medication, indications and side effect. Q 15 minute checks done for safety. Response. Safety on the unit maintained through 15 minute checks.  Medications taken as prescribed.

## 2017-11-23 NOTE — Progress Notes (Signed)
[facility] MD Progress Note  11/23/2017 12:57 PM Jason Bean  MRN:  [id number]   Subjective:  Patient reports that he is having some withdrawal symptoms, but since he got the Ativan he feels much better. He reports withdrawal symptoms of headaches, loss of appetite, and "tremors on the inside." He denies any SI/HI/AVH and contracts for safety. He states that he doesn't want to go to a rehab facility and wants to discharge after detox.   Objective: Patient's chart and findings reviewed and discussed with treatment team. Patient is pleasant and cooperative. No withdrawal symptoms are visible. Patient has been in the day room interacting appropriately. Patient will be continued on current medications.   Principal Problem: MDD (major depressive disorder), severe (HCC) Diagnosis:   Patient Active Problem List   Diagnosis Date Noted  . MDD (major depressive disorder), severe (HCC) [F32.2] 11/22/2017  . Alcohol abuse with intoxication (HCC) [F10.129]   . Severe recurrent major depression without psychotic features (HCC) [F33.2] 06/13/2017  . Alcohol dependence (HCC) [F10.20] 11/27/2012  . GERD (gastroesophageal reflux disease) [K21.9] 02/12/2012  . Hypertension [I10] 01/06/2012  . COPD (chronic obstructive pulmonary disease) (HCC) [J44.9] 01/06/2012  . Hyperlipidemia [E78.5] 01/06/2012  . Anxiety [F41.9] 01/06/2012  . Neck pain, chronic [M54.2, G89.29] 01/06/2012   Total Time spent with patient: 15 minutes  Past Psychiatric History: See H&P  Past Medical History:  Past Medical History:  Diagnosis Date  . Arthritis   . COPD (chronic obstructive pulmonary disease) (HCC)   . Depression   . Hyperlipidemia   . Hypertension     Past Surgical History:  Procedure Laterality Date  . BACK SURGERY    . NECK SURGERY     Family History: History reviewed. No pertinent family history. Family Psychiatric  History: See H&P Social History:  Social History   Substance and Sexual Activity  Alcohol  Use Yes   Comment: Pt drinks daily . unknown amount pt. reports "about 5-6 40 oz beers daily over last week     Social History   Substance and Sexual Activity  Drug Use Yes  . Types: Benzodiazepines   Comment: pt. reports "alprazolam 0.5 TID    Social History   Socioeconomic History  . Marital status: Single    Spouse name: None  . Number of children: None  . Years of education: None  . Highest education level: None  Social Needs  . Financial resource strain: None  . Food insecurity - worry: None  . Food insecurity - inability: None  . Transportation needs - medical: None  . Transportation needs - non-medical: None  Occupational History  . None  Tobacco Use  . Smoking status: Current Every Day Smoker    Packs/day: 1.00    Years: 30.00    Pack years: 30.00    Types: Cigarettes  . Smokeless tobacco: Never Used  Substance and Sexual Activity  . Alcohol use: Yes    Comment: Pt drinks daily . unknown amount pt. reports "about 5-6 40 oz beers daily over last week  . Drug use: Yes    Types: Benzodiazepines    Comment: pt. reports "alprazolam 0.5 TID  . Sexual activity: Not Currently  Other Topics Concern  . 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  Has daughter   Additional Social History:    Pain Medications: N/A Prescriptions: Atorvatatin Calcium, Fluticasone/Vilanterol, Montelucast Sodium, Albuterol, Alprazolam, Flonase, Robaxin 750, Requip, Folic Acid, Thiamine Over the Counter:  Unknown History of alcohol / drug use?: Yes Longest period of sobriety (when/how long): 3.5 years/@ 5 months ago Withdrawal Symptoms: Seizures, Irritability, Diarrhea, Nausea / Vomiting, Tremors Date of most recent seizure: doesn't recall Name of Substance 1: ETOH 1 - Age of First Use: Unknown 1 - Amount (size/oz): Varies 1 - Frequency: Daily over the last several days 1 - Duration: unknown 1 - Last Use / Amount: 11/21/2017 (BAL [place] 210  [place])                  Sleep: Good  Appetite:  Good  Current Medications: Current Facility-Administered Medications  Medication Dose Route Frequency Provider Last Rate Last Dose  . acetaminophen (TYLENOL) tablet 650 mg  650 mg Oral Q6H PRN Money, Gerlene Burdockravis B, FNP   650 mg at 11/23/17 [id number]  . albuterol (PROVENTIL HFA;VENTOLIN HFA) 108 (90 Base) MCG/ACT inhaler 2 puff  2 puff Inhalation Q6H PRN Money, Gerlene Burdockravis B, FNP      . alum & mag hydroxide-simeth (MAALOX/MYLANTA) 200-200-20 MG/5ML suspension 30 mL  30 mL Oral Q4H PRN Money, Gerlene Burdockravis B, FNP      . hydrOXYzine (ATARAX/VISTARIL) tablet 25 mg  25 mg Oral Q6H PRN Cobos, Rockey SituFernando A, MD   25 mg at 11/23/17 [id number]  . loperamide (IMODIUM) capsule 2-4 mg  2-4 mg Oral PRN Cobos, Rockey SituFernando A, MD   2 mg at 11/23/17 1202  . LORazepam (ATIVAN) tablet 1 mg  1 mg Oral Q6H PRN Cobos, Fernando A, MD      . LORazepam (ATIVAN) tablet 1 mg  1 mg Oral TID Cobos, Rockey SituFernando A, MD       Followed by  . [START ON 11/24/2017] LORazepam (ATIVAN) tablet 1 mg  1 mg Oral BID Cobos, Rockey SituFernando A, MD       Followed by  . [START ON 11/26/2017] LORazepam (ATIVAN) tablet 1 mg  1 mg Oral Daily Cobos, Fernando A, MD      . magnesium hydroxide (MILK OF MAGNESIA) suspension 30 mL  30 mL Oral Daily PRN Money, Feliz Beamravis B, FNP      . multivitamin with minerals tablet 1 tablet  1 tablet Oral Daily Cobos, Rockey SituFernando A, MD   1 tablet at 11/23/17 0810  . nicotine (NICODERM CQ - dosed in mg/24 hours) patch 21 mg  21 mg Transdermal Daily Money, Gerlene Burdockravis B, FNP   21 mg at 11/23/17 0810  . ondansetron (ZOFRAN-ODT) disintegrating tablet 4 mg  4 mg Oral Q6H PRN Cobos, Rockey SituFernando A, MD   4 mg at 11/23/17 0926  . thiamine (VITAMIN B-1) tablet 100 mg  100 mg Oral Daily Cobos, Rockey SituFernando A, MD   100 mg at 11/23/17 [id number]    Lab Results: No results found for this or any previous visit (from the past 48 hour(s)).  Blood Alcohol level:  Lab Results  Component Value Date   ETH 316 (H)  11/26/2012   ETH 212 (H) 03/09/2012    Metabolic Disorder Labs: Lab Results  Component Value Date   HGBA1C 5.8 (H) 11/13/2017   MPG 119.76 11/13/2017   MPG 123 06/14/2017   No results found for: PROLACTIN Lab Results  Component Value Date   CHOL 146 11/13/2017   TRIG 237 (H) 11/13/2017   HDL 82 11/13/2017   CHOLHDL 1.8 11/13/2017   VLDL 47 (H) 11/13/2017   LDLCALC 17 11/13/2017   LDLCALC 47 06/14/2017    Physical Findings: AIMS: Facial and Oral Movements Muscles of Facial Expression: None,  normal Lips and Perioral Area: None, normal Jaw: None, normal Tongue: None, normal,Extremity Movements Upper (arms, wrists, hands, fingers): Mild Lower (legs, knees, ankles, toes): None, normal, Trunk Movements Neck, shoulders, hips: None, normal, Overall Severity Severity of abnormal movements (highest score from questions above): None, normal Incapacitation due to abnormal movements: None, normal Patient's awareness of abnormal movements (rate only patient's report): No Awareness, Dental Status Current problems with teeth and/or dentures?: Yes Does patient usually wear dentures?: Yes  CIWA:  CIWA-Ar Total: 7 COWS:     Musculoskeletal: Strength & Muscle Tone: within normal limits Gait & Station: normal Patient leans: N/A  Psychiatric Specialty Exam: Physical Exam  Nursing note and vitals reviewed. Constitutional: He is oriented to person, place, and time. He appears well-developed and well-nourished.  Cardiovascular: Normal rate.  Respiratory: Effort normal.  Musculoskeletal: Normal range of motion.  Neurological: He is alert and oriented to person, place, and time.  Skin: Skin is warm.    Review of Systems  Constitutional: Negative.   HENT: Negative.   Eyes: Negative.   Respiratory: Negative.   Cardiovascular: Negative.   Gastrointestinal: Negative.   Genitourinary: Negative.   Musculoskeletal: Negative.   Skin: Negative.   Neurological: Negative.    Endo/Heme/Allergies: Negative.   Psychiatric/Behavioral: Positive for depression. Negative for hallucinations and suicidal ideas. The patient is nervous/anxious.     Blood pressure 123/71, pulse 70, temperature 97.8 F (36.6 C), temperature source Oral, resp. rate 18, height 5' 7.5" (1.715 m), weight 71.1 kg (156 lb 12 oz).Body mass index is 24.19 kg/m.  General Appearance: Casual  Eye Contact:  Good  Speech:  Clear and Coherent and Normal Rate  Volume:  Normal  Mood:  Depressed  Affect:  Flat  Thought Process:  Goal Directed and Descriptions of Associations: Intact  Orientation:  Full (Time, Place, and Person)  Thought Content:  WDL  Suicidal Thoughts:  No  Homicidal Thoughts:  No  Memory:  Immediate;   Good Recent;   Good Remote;   Good  Judgement:  Good  Insight:  Fair  Psychomotor Activity:  Normal  Concentration:  Concentration: Good and Attention Span: Good  Recall:  Good  Fund of Knowledge:  Good  Language:  Good  Akathisia:  No  Handed:  Right  AIMS (if indicated):     Assets:  Communication Skills Desire for Improvement Financial Resources/Insurance Housing Physical Health Social Support Transportation  ADL's:  Intact  Cognition:  WNL  Sleep:  Number of Hours: 6.5   Problems Addressed: MDD severe Alcohol Dependence  Treatment Plan Summary: Daily contact with patient to assess and evaluate symptoms and progress in treatment, Medication management and Plan is to:  -Continue Ativan Detox Protocol -Continue Vistaril 25 mg PO Q6H PRN for anxiety -Encourage group therapy placement  Maryfrances Bunnellravis B Money, FNP 11/23/2017, 12:57 PM   Agree with NP Progress Note

## 2017-11-24 DIAGNOSIS — I10 Essential (primary) hypertension: Secondary | ICD-10-CM

## 2017-11-24 DIAGNOSIS — G8929 Other chronic pain: Secondary | ICD-10-CM

## 2017-11-24 DIAGNOSIS — E785 Hyperlipidemia, unspecified: Secondary | ICD-10-CM

## 2017-11-24 DIAGNOSIS — K219 Gastro-esophageal reflux disease without esophagitis: Secondary | ICD-10-CM

## 2017-11-24 DIAGNOSIS — F1099 Alcohol use, unspecified with unspecified alcohol-induced disorder: Secondary | ICD-10-CM

## 2017-11-24 DIAGNOSIS — J449 Chronic obstructive pulmonary disease, unspecified: Secondary | ICD-10-CM

## 2017-11-24 NOTE — Plan of Care (Signed)
  Safety: Periods of time without injury will increase 11/24/2017 2203 - Progressing by Jason Bean, Jason Gotwalt A, RN Note Pt safe on the unit at this time

## 2017-11-24 NOTE — Progress Notes (Signed)
D: Pt denies SI/HI/AVH. Pt is pleasant and cooperative. Pt stated he was "better" , pt concern was when he was leaving  A: Pt was offered support and encouragement. Pt was given scheduled medications. Pt was encourage to attend groups. Q 15 minute checks were done for safety.   R:Pt attends groups and interacts well with peers and staff. Pt is taking medication. Pt has no complaints.Pt receptive to treatment and safety maintained on unit.

## 2017-11-24 NOTE — Progress Notes (Signed)
[facility] MD Progress Note  11/24/2017 4:34 PM IRINEO GAULIN  MRN:  [id number] Subjective:   57 y.o Caucasian male, single, lives alone, on SSI. Background history of Alcohol Use Disorder and MDD. Transferred from [facility] ER. Intoxicated with alcohol. Expressed suicidal thoughts at presentation. Patient was recently discharged from our unit.  Chart reviewed today. Patient discussed at team today  Staff reports that he has been very focused on discharge. He is still on scheduled Ativan. He has repeatedly denied any suicidal thoughts. He has been focused on being discharged.   Seen today. Says when he left, he went back into the same situation. Says he started drinking again. Patient says he came in for detox. He is surprised about documentation relating to suicide. Says he was intoxicated when he said it. says he does not own any weapon and he does not have access to any weapon. Patient states that his bills are due today. Says he would pay a late fee charge if he does not get it sorted out. He has agreed to have paper sent to his landlord.   Says he is still taking Ativan. No sweatiness, no headaches. No retching, nausea or vomiting. No fullness in the head. No visual, tactile or auditory hallucination. No internal restlessness. No suicidal or homicidal thoughts.     Principal Problem: MDD Recurrent Diagnosis:   Patient Active Problem List   Diagnosis Date Noted  . MDD (major depressive disorder), severe (HCC) [F32.2] 11/22/2017  . Alcohol abuse with intoxication (HCC) [F10.129]   . Severe recurrent major depression without psychotic features (HCC) [F33.2] 06/13/2017  . Alcohol dependence (HCC) [F10.20] 11/27/2012  . GERD (gastroesophageal reflux disease) [K21.9] 02/12/2012  . Hypertension [I10] 01/06/2012  . COPD (chronic obstructive pulmonary disease) (HCC) [J44.9] 01/06/2012  . Hyperlipidemia [E78.5] 01/06/2012  . Anxiety [F41.9] 01/06/2012  . Neck pain, chronic [M54.2, G89.29] 01/06/2012    Total Time spent with patient: 20 minutes  Past Psychiatric History: As in H&P  Past Medical History:  Past Medical History:  Diagnosis Date  . Arthritis   . COPD (chronic obstructive pulmonary disease) (HCC)   . Depression   . Hyperlipidemia   . Hypertension     Past Surgical History:  Procedure Laterality Date  . BACK SURGERY    . NECK SURGERY     Family History: History reviewed. No pertinent family history. Family Psychiatric  History: As in H&P Social History:  Social History   Substance and Sexual Activity  Alcohol Use Yes   Comment: Pt drinks daily . unknown amount pt. reports "about 5-6 40 oz beers daily over last week     Social History   Substance and Sexual Activity  Drug Use Yes  . Types: Benzodiazepines   Comment: pt. reports "alprazolam 0.5 TID    Social History   Socioeconomic History  . Marital status: Single    Spouse name: None  . Number of children: None  . Years of education: None  . Highest education level: None  Social Needs  . Financial resource strain: None  . Food insecurity - worry: None  . Food insecurity - inability: None  . Transportation needs - medical: None  . Transportation needs - non-medical: None  Occupational History  . None  Tobacco Use  . Smoking status: Current Every Day Smoker    Packs/day: 1.00    Years: 30.00    Pack years: 30.00    Types: Cigarettes  . Smokeless tobacco: Never Used  Substance and Sexual  Activity  . Alcohol use: Yes    Comment: Pt drinks daily . unknown amount pt. reports "about 5-6 40 oz beers daily over last week  . Drug use: Yes    Types: Benzodiazepines    Comment: pt. reports "alprazolam 0.5 TID  . Sexual activity: Not Currently  Other Topics Concern  . 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  Has daughter   Additional Social History:    Pain Medications: N/A Prescriptions: Atorvatatin Calcium, Fluticasone/Vilanterol,  Montelucast Sodium, Albuterol, Alprazolam, Flonase, Robaxin 750, Requip, Folic Acid, Thiamine Over the Counter: Unknown History of alcohol / drug use?: Yes Longest period of sobriety (when/how long): 3.5 years/@ 5 months ago Withdrawal Symptoms: Seizures, Irritability, Diarrhea, Nausea / Vomiting, Tremors Date of most recent seizure: doesn't recall Name of Substance 1: ETOH 1 - Age of First Use: Unknown 1 - Amount (size/oz): Varies 1 - Frequency: Daily over the last several days 1 - Duration: unknown 1 - Last Use / Amount: 11/21/2017 (BAL was 210 upon arrival to [facility])     Sleep: Good  Appetite:  Good  Current Medications: Current Facility-Administered Medications  Medication Dose Route Frequency Provider Last Rate Last Dose  . acetaminophen (TYLENOL) tablet 650 mg  650 mg Oral Q6H PRN Money, Gerlene Burdockravis B, FNP   650 mg at 11/24/17 1000  . albuterol (PROVENTIL HFA;VENTOLIN HFA) 108 (90 Base) MCG/ACT inhaler 2 puff  2 puff Inhalation Q6H PRN Money, Gerlene Burdockravis B, FNP      . alum & mag hydroxide-simeth (MAALOX/MYLANTA) 200-200-20 MG/5ML suspension 30 mL  30 mL Oral Q4H PRN Money, Gerlene Burdockravis B, FNP      . hydrOXYzine (ATARAX/VISTARIL) tablet 25 mg  25 mg Oral Q6H PRN Cobos, Rockey SituFernando A, MD   25 mg at 11/24/17 [id number]  . loperamide (IMODIUM) capsule 2-4 mg  2-4 mg Oral PRN Cobos, Rockey SituFernando A, MD   2 mg at 11/23/17 2055  . LORazepam (ATIVAN) tablet 1 mg  1 mg Oral Q6H PRN Cobos, Rockey SituFernando A, MD   1 mg at 11/23/17 2055  . LORazepam (ATIVAN) tablet 1 mg  1 mg Oral BID Cobos, Rockey SituFernando A, MD       Followed by  . [START ON 11/26/2017] LORazepam (ATIVAN) tablet 1 mg  1 mg Oral Daily Cobos, Fernando A, MD      . magnesium hydroxide (MILK OF MAGNESIA) suspension 30 mL  30 mL Oral Daily PRN Money, Feliz Beamravis B, FNP      . multivitamin with minerals tablet 1 tablet  1 tablet Oral Daily Cobos, Rockey SituFernando A, MD   1 tablet at 11/24/17 0732  . nicotine (NICODERM CQ - dosed in mg/24 hours) patch 21 mg  21 mg Transdermal Daily  Money, Gerlene Burdockravis B, FNP   21 mg at 11/24/17 0733  . ondansetron (ZOFRAN-ODT) disintegrating tablet 4 mg  4 mg Oral Q6H PRN Cobos, Rockey SituFernando A, MD   4 mg at 11/23/17 0926  . thiamine (VITAMIN B-1) tablet 100 mg  100 mg Oral Daily Cobos, Rockey SituFernando A, MD   100 mg at 11/24/17 0732    Lab Results:  No results found for this or any previous visit (from the past 48 hour(s)).  Blood Alcohol level:  Lab Results  Component Value Date   ETH 316 (H) 11/26/2012   ETH 212 (H) 03/09/2012    Metabolic Disorder Labs: Lab Results  Component Value Date   HGBA1C 5.8 (H) 11/13/2017   MPG 119.76 11/13/2017  MPG 123 06/14/2017   No results found for: PROLACTIN Lab Results  Component Value Date   CHOL 146 11/13/2017   TRIG 237 (H) 11/13/2017   HDL 82 11/13/2017   CHOLHDL 1.8 11/13/2017   VLDL 47 (H) 11/13/2017   LDLCALC 17 11/13/2017   LDLCALC 47 06/14/2017    Physical Findings: AIMS: Facial and Oral Movements Muscles of Facial Expression: None, normal Lips and Perioral Area: None, normal Jaw: None, normal Tongue: None, normal,Extremity Movements Upper (arms, wrists, hands, fingers): Mild Lower (legs, knees, ankles, toes): None, normal, Trunk Movements Neck, shoulders, hips: None, normal, Overall Severity Severity of abnormal movements (highest score from questions above): None, normal Incapacitation due to abnormal movements: None, normal Patient's awareness of abnormal movements (rate only patient's report): No Awareness, Dental Status Current problems with teeth and/or dentures?: Yes(Patient has dentures, not with him.) Does patient usually wear dentures?: Yes  CIWA:  CIWA-Ar Total: 6 COWS:     Musculoskeletal: Strength & Muscle Tone: within normal limits Gait & Station: broad based Patient leans: N/A  Psychiatric Specialty Exam: Physical Exam  Constitutional: He is oriented to person, place, and time. He appears well-developed and well-nourished.  HENT:  Head: Normocephalic.   Eyes: Pupils are equal, round, and reactive to light.  Neck: Normal range of motion. Neck supple.  Cardiovascular: Normal rate.  Neurological: He is alert and oriented to person, place, and time.  Psychiatric:  As above    ROS  Blood pressure 118/82, pulse (!) 104, temperature 99.1 F (37.3 C), resp. rate 16, height 5' 7.5" (1.715 m), weight 71.1 kg (156 lb 12 oz).Body mass index is 24.19 kg/m.  General Appearance: No tremors . Not sweaty. Slightly flushed. irritated about hospital stay. .   Eye Contact:  Good  Speech:  Spontaneous, normal prosody. Normal tone and rate.   Volume:  Normal  Mood:  Says he is feeling good.   Affect:  Irritable   Thought Process:  Linear  Orientation:  Full (Time, Place, and Person)  Thought Content:  Focused on being discharged. No delusional theme. No preoccupation with violent thoughts. No negative ruminations. No obsession.  No hallucination in any modality.   Suicidal Thoughts:  No  Homicidal Thoughts:  No  Memory:  WNL  Judgement:  Fair  Insight:  Shallow  Psychomotor Activity:  Normal  Concentration:  WNL  Recall:  WNL  Fund of Knowledge:  Good  Language:  Good  Akathisia:  Negative  Handed:    AIMS (if indicated):     Assets:  Communication Skills Desire for Improvement Resilience  ADL's:  Intact  Cognition:  WNL  Sleep:  Number of Hours: 6.5     Treatment Plan Summary:  Patient is still coming off alcohol. We are still tapering benzodiazepines. Hopeful discharge by Thursday.   Psychiatric: MDD recurrent AUD  Medical: Seizure disorder HTN HLD GERD Chronic pain COPD  Psychosocial:  Limited support Homelessness  PLAN: 1. Decrease Ativan to 1 mg BID 2. Continue other medications at current dose  2. Continue to monitor mood, behavior and interactions with peers  Georgiann Cocker, MD 11/24/2017, 4:34 PMPatient ID: Beryle Quant, male   DOB: 11-19-1961, 57 y.o.   MRN: [id number]

## 2017-11-24 NOTE — Plan of Care (Signed)
Patient states that he wants to go home, expressing worry about missing his rent payment, though verbalizes understanding of need to remain admitted to unit at this time. Verbalizes understanding of CIWA protocol and Ativan administration. States "I am done detoxing, but its the Doctors call".

## 2017-11-24 NOTE — Progress Notes (Signed)
D: Patient alert and oriented. Affect/mood: flat, irritable at times however is pleasant and cooperative. Denies SI, HI, AVH at this time. Endorsed headache pain this morning, PRN Tylenol at 1000 given with some relief. CIWA of 6 this afternoon. Patient requests Ativan as scheduled. Goal: "to find a way home so I dont got to walk or ride a bus". Patient reports to this writer that he feels he has finished detoxing from alcohol. Looks forward to discharging and abstaining from alcohol once home.   A: Scheduled medications administered to patient per MD order. Support and encouragement provided. Routine safety checks conducted every 15 minutes. Patient informed to notify staff with problems or concerns.  R: No adverse drug reactions noted. Patient contracts for safety at this time. Patient compliant with medications and treatment plan. Patient receptive, calm, and cooperative. Patient interacts well with others on the unit. Patient remains safe at this time.

## 2017-11-25 MED ORDER — HYDROXYZINE HCL 25 MG PO TABS
25.0000 mg | ORAL_TABLET | Freq: Once | ORAL | Status: AC
  Administered 2017-11-25: 25 mg via ORAL
  Filled 2017-11-25 (×2): qty 1

## 2017-11-25 MED ORDER — TRAZODONE HCL 100 MG PO TABS
100.0000 mg | ORAL_TABLET | Freq: Once | ORAL | Status: AC
  Administered 2017-11-25: 100 mg via ORAL
  Filled 2017-11-25 (×2): qty 1

## 2017-11-25 NOTE — Plan of Care (Signed)
  Progressing Activity: Sleeping patterns will improve 11/25/2017 2236 - Progressing by Arrie Aranhurch, Read Bonelli J, RN Note Slept 6.5 hours last night according to flowsheet.

## 2017-11-25 NOTE — Progress Notes (Signed)
[facility] MD Progress Note  11/25/2017 2:28 PM Jason Bean  MRN:  [id number]   Subjective:  Patient reports that he feel really good today. He denies any withdrawal asymptoms and he refused his last dose of Ativan as well. He wants to discharge, but agrees to wait another day. He states that he needs transportation to his house once he is discharged. He denies any SI/HI/AVH and contracts for safety.    Objective: Patient's chart and findings reviewed and discussed with treatment team. Patient presents in the day room interacting appropriately and has been attending groups. He has been much more cooperative than at admission. Will continue current medications and will plan discharge for tomorrow. Patient continues to refuse any antidepressants and denies he need for any. Since patient has stopped the detox early will monitor until tomorrow for withdrawal symptoms and then will plan for discharge tomorrow.  Principal Problem: MDD (major depressive disorder), severe (HCC) Diagnosis:   Patient Active Problem List   Diagnosis Date Noted  . MDD (major depressive disorder), severe (HCC) [F32.2] 11/22/2017  . Alcohol abuse with intoxication (HCC) [F10.129]   . Severe recurrent major depression without psychotic features (HCC) [F33.2] 06/13/2017  . Alcohol dependence (HCC) [F10.20] 11/27/2012  . GERD (gastroesophageal reflux disease) [K21.9] 02/12/2012  . Hypertension [I10] 01/06/2012  . COPD (chronic obstructive pulmonary disease) (HCC) [J44.9] 01/06/2012  . Hyperlipidemia [E78.5] 01/06/2012  . Anxiety [F41.9] 01/06/2012  . Neck pain, chronic [M54.2, G89.29] 01/06/2012   Total Time spent with patient: 15 minutes  Past Psychiatric History: See H&P  Past Medical History:  Past Medical History:  Diagnosis Date  . Arthritis   . COPD (chronic obstructive pulmonary disease) (HCC)   . Depression   . Hyperlipidemia   . Hypertension     Past Surgical History:  Procedure Laterality Date  . BACK  SURGERY    . NECK SURGERY     Family History: History reviewed. No pertinent family history. Family Psychiatric  History: See H&P Social History:  Social History   Substance and Sexual Activity  Alcohol Use Yes   Comment: Pt drinks daily . unknown amount pt. reports "about 5-6 40 oz beers daily over last week     Social History   Substance and Sexual Activity  Drug Use Yes  . Types: Benzodiazepines   Comment: pt. reports "alprazolam 0.5 TID    Social History   Socioeconomic History  . Marital status: Single    Spouse name: None  . Number of children: None  . Years of education: None  . Highest education level: None  Social Needs  . Financial resource strain: None  . Food insecurity - worry: None  . Food insecurity - inability: None  . Transportation needs - medical: None  . Transportation needs - non-medical: None  Occupational History  . None  Tobacco Use  . Smoking status: Current Every Day Smoker    Packs/day: 1.00    Years: 30.00    Pack years: 30.00    Types: Cigarettes  . Smokeless tobacco: Never Used  Substance and Sexual Activity  . Alcohol use: Yes    Comment: Pt drinks daily . unknown amount pt. reports "about 5-6 40 oz beers daily over last week  . Drug use: Yes    Types: Benzodiazepines    Comment: pt. reports "alprazolam 0.5 TID  . Sexual activity: Not Currently  Other Topics Concern  . 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   Additional Social History:    Pain Medications: N/A Prescriptions: Atorvatatin Calcium, Fluticasone/Vilanterol, Montelucast Sodium, Albuterol, Alprazolam, Flonase, Robaxin 750, Requip, Folic Acid, Thiamine Over the Counter: Unknown History of alcohol / drug use?: Yes Longest period of sobriety (when/how long): 3.5 years/@ 5 months ago Withdrawal Symptoms: Seizures, Irritability, Diarrhea, Nausea / Vomiting, Tremors Date of most recent seizure: doesn't  recall Name of Substance 1: ETOH 1 - Age of First Use: Unknown 1 - Amount (size/oz): Varies 1 - Frequency: Daily over the last several days 1 - Duration: unknown 1 - Last Use / Amount: 11/21/2017 (BAL was 210 upon arrival to [place])                  Sleep: Good  Appetite:  Good  Current Medications: Current Facility-Administered Medications  Medication Dose Route Frequency Provider Last Rate Last Dose  . acetaminophen (TYLENOL) tablet 650 mg  650 mg Oral Q6H PRN Money, Gerlene Burdock, FNP   650 mg at 11/24/17 1000  . albuterol (PROVENTIL HFA;VENTOLIN HFA) 108 (90 Base) MCG/ACT inhaler 2 puff  2 puff Inhalation Q6H PRN Money, Gerlene Burdock, FNP      . alum & mag hydroxide-simeth (MAALOX/MYLANTA) 200-200-20 MG/5ML suspension 30 mL  30 mL Oral Q4H PRN Money, Gerlene Burdock, FNP      . hydrOXYzine (ATARAX/VISTARIL) tablet 25 mg  25 mg Oral Q6H PRN Cobos, Rockey Situ, MD   25 mg at 11/24/17 0981  . loperamide (IMODIUM) capsule 2-4 mg  2-4 mg Oral PRN Cobos, Rockey Situ, MD   2 mg at 11/23/17 2055  . LORazepam (ATIVAN) tablet 1 mg  1 mg Oral Q6H PRN Cobos, Rockey Situ, MD   1 mg at 11/23/17 2055  . LORazepam (ATIVAN) tablet 1 mg  1 mg Oral BID Cobos, Rockey Situ, MD   1 mg at 11/24/17 1658   Followed by  . [START ON 11/26/2017] LORazepam (ATIVAN) tablet 1 mg  1 mg Oral Daily Cobos, Fernando A, MD      . magnesium hydroxide (MILK OF MAGNESIA) suspension 30 mL  30 mL Oral Daily PRN Money, Feliz Beam B, FNP      . multivitamin with minerals tablet 1 tablet  1 tablet Oral Daily Cobos, Rockey Situ, MD   1 tablet at 11/24/17 0732  . nicotine (NICODERM CQ - dosed in mg/24 hours) patch 21 mg  21 mg Transdermal Daily Money, Gerlene Burdock, FNP   21 mg at 11/24/17 0733  . ondansetron (ZOFRAN-ODT) disintegrating tablet 4 mg  4 mg Oral Q6H PRN Cobos, Rockey Situ, MD   4 mg at 11/23/17 0926  . thiamine (VITAMIN B-1) tablet 100 mg  100 mg Oral Daily Cobos, Rockey Situ, MD   100 mg at 11/24/17 0732    Lab Results: No results found  for this or any previous visit (from the past 48 hour(s)).  Blood Alcohol level:  Lab Results  Component Value Date   ETH 316 (H) 11/26/2012   ETH 212 (H) 03/09/2012    Metabolic Disorder Labs: Lab Results  Component Value Date   HGBA1C 5.8 (H) 11/13/2017   MPG 119.76 11/13/2017   MPG 123 06/14/2017   No results found for: PROLACTIN Lab Results  Component Value Date   CHOL 146 11/13/2017   TRIG 237 (H) 11/13/2017   HDL 82 11/13/2017   CHOLHDL 1.8 11/13/2017   VLDL 47 (H) 11/13/2017   LDLCALC 17 11/13/2017   LDLCALC 47 06/14/2017  Physical Findings: AIMS: Facial and Oral Movements Muscles of Facial Expression: None, normal Lips and Perioral Area: None, normal Jaw: None, normal Tongue: None, normal,Extremity Movements Upper (arms, wrists, hands, fingers): Mild Lower (legs, knees, ankles, toes): None, normal, Trunk Movements Neck, shoulders, hips: None, normal, Overall Severity Severity of abnormal movements (highest score from questions above): None, normal Incapacitation due to abnormal movements: None, normal Patient's awareness of abnormal movements (rate only patient's report): No Awareness, Dental Status Current problems with teeth and/or dentures?: Yes(Patient has dentures, not with him.) Does patient usually wear dentures?: Yes  CIWA:  CIWA-Ar Total: 1 COWS:     Musculoskeletal: Strength & Muscle Tone: within normal limits Gait & Station: normal Patient leans: N/A  Psychiatric Specialty Exam: Physical Exam  Nursing note and vitals reviewed. Constitutional: He appears well-developed and well-nourished.  Cardiovascular: Normal rate.  Respiratory: Effort normal.  Musculoskeletal: Normal range of motion.  Neurological: He is alert.  Skin: Skin is warm.    Review of Systems  Constitutional: Negative.   HENT: Negative.   Eyes: Negative.   Respiratory: Negative.   Cardiovascular: Negative.   Gastrointestinal: Negative.   Genitourinary: Negative.    Musculoskeletal: Negative.   Skin: Negative.   Neurological: Negative.   Endo/Heme/Allergies: Negative.   Psychiatric/Behavioral: Negative.     Blood pressure 122/78, pulse 74, temperature 97.9 F (36.6 C), temperature source Oral, resp. rate 18, height 5' 7.5" (1.715 m), weight 71.1 kg (156 lb 12 oz).Body mass index is 24.19 kg/m.  General Appearance: Casual and Fairly Groomed  Eye Contact:  Good  Speech:  Clear and Coherent and Normal Rate  Volume:  Normal  Mood:  Euthymic  Affect:  Appropriate  Thought Process:  Goal Directed and Descriptions of Associations: Intact  Orientation:  Full (Time, Place, and Person)  Thought Content:  WDL  Suicidal Thoughts:  No  Homicidal Thoughts:  No  Memory:  Immediate;   Good Recent;   Good Remote;   Good  Judgement:  Good  Insight:  Good  Psychomotor Activity:  Normal  Concentration:  Concentration: Good and Attention Span: Good  Recall:  Good  Fund of Knowledge:  Good  Language:  Good  Akathisia:  No  Handed:  Right  AIMS (if indicated):     Assets:  Communication Skills Desire for Improvement Financial Resources/Insurance Housing Physical Health Social Support Transportation  ADL's:  Intact  Cognition:  WNL  Sleep:  Number of Hours: 6.5   Problems Addressed: MDD Severe Alcohol dependence  Treatment Plan Summary: Daily contact with patient to assess and evaluate symptoms and progress in treatment, Medication management and Plan is to:  -Continue Ativan PRN CIWA  -Continue Vistaril 25 mg PO Q6H PRN for anxiety or CIWA >10 -Continue Trazodone 50 mg PO QHS PRN for insoimnia -Encourage group therapy participation -CSW to arrange assistance with transportation  [employer] Groupravis B Money, FNP 11/25/2017, 2:28 PM

## 2017-11-25 NOTE — Progress Notes (Signed)
The patient verbalized that he accomplished his goal for the day which was to avoid taking Ativan. His goal for tomorrow is to try and get discharged.

## 2017-11-25 NOTE — Progress Notes (Signed)
Recreation Therapy Notes  Date: 11/25/17 Time: 0930 Location: [address] Dayroom  Group Topic: Stress Management  Goal Area(s) Addresses:  Patient will verbalize importance of using healthy stress management.  Patient will identify positive emotions associated with healthy stress management.   Intervention: Stress Management  Activity :  Body Scan Meditation.  LRT introduced the stress management technique of meditation.  LRT played a script that guided patients through a body scan that allowed patients to become aware of any sensations they may have been experiencing.  Education:  Stress Management, Discharge Planning.   Education Outcome: Acknowledges edcuation/In group clarification offered/Needs additional education  Clinical Observations/Feedback: Pt did not attend group.     Caroll RancherMarjette Tyrece Vanterpool, LRT/CTRS         Lillia AbedLindsay, Charita Lindenberger A 11/25/2017 1:10 PM

## 2017-11-25 NOTE — Progress Notes (Signed)
D: Pt was in the dayroom upon initial approach.  Pt presents with anxious affect and mood.  Describes his day as "alright."  His goal is "to go home" and he reports he discharges tomorrow "by 12."  Pt reports he feels safe to discharge tomorrow and he plans to go to AA and [facility] for aftercare.  Pt denies SI/HI, denies hallucinations, denies pain.  Pt has been visible in milieu interacting with peers and staff appropriately.    A: Introduced self to pt.  Actively listened to pt and offered support and encouragement. Pt reports difficulty going to sleep and anxiety because he is looking forward to discharge tomorrow.  On-site provider notified and Trazodone 100 mg POX1 and Vistaril 25 mg POX1 was ordered and administered.  Q15 minute safety checks maintained.  R: Pt is safe on the unit.  Pt is compliant with medications.  Pt verbally contracts for safety.  Will continue to monitor and assess.

## 2017-11-26 MED ORDER — ALBUTEROL SULFATE HFA 108 (90 BASE) MCG/ACT IN AERS: 2.0000 | INHALATION_SPRAY | Freq: Four times a day (QID) | RESPIRATORY_TRACT | 0 refills | Status: AC | PRN

## 2017-11-26 NOTE — Progress Notes (Signed)
CSW discussed ride home with pt.  Pt does not want to ride the bus and reports he has money to pay for a cab.  CSW called [facility]. Garner NashGregory Rabia Argote, MSW, LCSW Clinical Social Worker 11/26/2017 9:34 AM

## 2017-11-26 NOTE — [facility] Suicide Risk Assessment (Signed)
[facility] Discharge Suicide Risk Assessment   Principal Problem: MDD (major depressive disorder), severe [facility]) Discharge Diagnoses:  Patient Active Problem List   Diagnosis Date Noted  . MDD (major depressive disorder), severe (HCC) [F32.2] 11/22/2017  . Alcohol abuse with intoxication (HCC) [F10.129]   . Severe recurrent major depression without psychotic features (HCC) [F33.2] 06/13/2017  . Alcohol dependence (HCC) [F10.20] 11/27/2012  . GERD (gastroesophageal reflux disease) [K21.9] 02/12/2012  . Hypertension [I10] 01/06/2012  . COPD (chronic obstructive pulmonary disease) (HCC) [J44.9] 01/06/2012  . Hyperlipidemia [E78.5] 01/06/2012  . Anxiety [F41.9] 01/06/2012  . Neck pain, chronic [M54.2, G89.29] 01/06/2012    Total Time spent with patient: 45 minutes  Musculoskeletal: Strength & Muscle Tone: within normal limits Gait & Station: normal Patient leans: N/A  Psychiatric Specialty Exam: Review of Systems  Constitutional: Negative.   HENT: Negative.   Eyes: Negative.   Respiratory: Negative.   Cardiovascular: Negative.   Gastrointestinal: Negative.   Genitourinary: Negative.   Musculoskeletal: Negative.   Skin: Negative.   Neurological: Negative.   Endo/Heme/Allergies: Negative.   Psychiatric/Behavioral: Negative for depression, hallucinations, memory loss and suicidal ideas. The patient is not nervous/anxious and does not have insomnia.     Blood pressure 123/75, pulse 84, temperature 97.9 F (36.6 C), temperature source Oral, resp. rate 18, height 5' 7.5" (1.715 m), weight 71.1 kg (156 lb 12 oz).Body mass index is 24.19 kg/m.  General Appearance: Neatly dressed, pleasant, engaging well and cooperative. Appropriate behavior. Not in any distress. Good relatedness. Not internally stimulated.  Eye Contact::  Good  Speech: Spontaneous, normal prosody. Normal tone and rate.   Volume:  Normal  Mood:  Euthymic  Affect:  Appropriate and Full Range  Thought Process:  Linear   Orientation:  Full (Time, Place, and Person)  Thought Content:  No delusional theme. No preoccupation with violent thoughts. No negative ruminations. No obsession.  No hallucination in any modality.   Suicidal Thoughts:  No  Homicidal Thoughts:  No  Memory:  Immediate;   Good Recent;   Good Remote;   Good  Judgement:  Good  Insight:   Limited as he is not motivated to deal with his addiction at this time  Psychomotor Activity:  Normal  Concentration:  Good  Recall:  Good  Fund of Knowledge:Good  Language: Good  Akathisia:  Negative  Handed:    AIMS (if indicated):     Assets:  Communication Skills Desire for Improvement Financial Resources/Insurance Housing Resilience  Sleep:  Number of Hours: 6  Cognition: WNL  ADL's:  Intact   Clinical Assessment::   57y.o Caucasian male, single, livesalone, on SSI. Background history of Alcohol Use Disorder and MDD. Transferred from [facility] ER. Intoxicated with alcohol. Expressed suicidal while intoxicated. Stated he had a 9 mm hand gun which he was going to use. Patient has had repeated admissions here on account of alcohol intoxication. He was recently discharged from our unit. When patient sobered up, he had no recollection of expressing suicidal thoughts. He clarified that he has no access to weapons. Says he was drunk and said a lot of "crap". Patient detoxed on Ativan protocol. As he got better, he realized his bills were due. He took steps to make sure he is not charged interest or late payment fees.   Seen today. Says he has completely come off alcohol. No lingering withdrawal symptoms. Reinstated that he was not suicidal and he does not have access any weapons.  Reports that he is in good spirits.  Not feeling depressed. Reports normal energy and interest. Has been maintaining normal biological functions. He is able to think clearly. He is able to focus on task. His thoughts are not crowded or racing. No evidence of mania. No  hallucination in any modality. He is not making any delusional statement. No passivity of will/thought. He is fully in touch with reality. No thoughts of suicide. No thoughts of homicide. No violent thoughts. No overwhelming anxiety.  Nursing staff reports that patient has been appropriate on the unit. Patient has been interacting well with peers. No behavioral issues. Patient has not voiced any suicidal thoughts. Patient has not been observed to be internally stimulated. Patient has been adherent with treatment recommendations. Patient has been tolerating their medication well.   Patient was discussed at team. Team members feels that patient is back to his baseline level of function. Team agrees with plan to discharge patient today.    Demographic Factors:  Male, Caucasian, Low socioeconomic status and Living alone  Loss Factors: NA  Historical Factors: Impulsivity  Risk Reduction Factors:   Positive social support, Positive therapeutic relationship and Positive coping skills or problem solving skills  Continued Clinical Symptoms:  As above   Cognitive Features That Contribute To Risk:  None    Suicide Risk:  Low. Patient is not having any thoughts of suicide at this time. Modifiable risk factors targeted during this admission includes depression and substance use. Demographical and historical risk factors cannot be modified. Patient is now engaging well. Patient is reliable and is future oriented. We have buffered patient's support structures. At this point, patient is at low risk of suicide. Patient is aware of the effects of psychoactive substances on decision making process. Patient has been provided with emergency contacts. Patient acknowledges to use resources provided if unforseen circumstances changes their current risk stratification.    Follow-up Information    [facility] Go on 11/27/2017.   Why:  Please attend your follow up appointment at 12:15pm on  Friday, November 27, 2017.  Please bring a copy of your hospital discharge paperwork. Contact information: [address] [place] [address] [phone]           Plan Of Care/Follow-up recommendations:  1. Continue current psychotropic medications 2. Mental health and addiction follow up as arranged.  3. Provided limited quantity of prescriptions   Georgiann CockerVincent A [name], MD 11/26/2017, 9:59 AM

## 2017-11-26 NOTE — Discharge Summary (Signed)
Physician Discharge Summary Note  Patient:  Jason Bean is an 57 y.o., male MRN:  [id number] DOB:  Jan 11, 1961 Patient phone:  [phone] (home)  Patient address:   [address] [place] [address],  Total Time spent with patient: 20 minutes  Date of Admission:  11/22/2017 Date of Discharge: 11/26/17  Reason for Admission:  Worsening depression with SI  Principal Problem: MDD (major depressive disorder), severe [facility](HCC) Discharge Diagnoses: Patient Active Problem List   Diagnosis Date Noted  . MDD (major depressive disorder), severe (HCC) [F32.2] 11/22/2017  . Alcohol abuse with intoxication (HCC) [F10.129]   . Severe recurrent major depression without psychotic features (HCC) [F33.2] 06/13/2017  . Alcohol dependence (HCC) [F10.20] 11/27/2012  . GERD (gastroesophageal reflux disease) [K21.9] 02/12/2012  . Hypertension [I10] 01/06/2012  . COPD (chronic obstructive pulmonary disease) (HCC) [J44.9] 01/06/2012  . Hyperlipidemia [E78.5] 01/06/2012  . Anxiety [F41.9] 01/06/2012  . Neck pain, chronic [M54.2, G89.29] 01/06/2012    Past Psychiatric History: reports several psychiatric admissions, for alcohol dependence and depression. Denies any history of suicidal attempts, denies history of self injurious ideations. Denies history of mania . Denies history of psychosis, other than possibly having time limited auditory hallucinations when intoxicated with alcohol. Endorses history of panic attacks and agoraphobia  Past Medical History:  Past Medical History:  Diagnosis Date  . Arthritis   . COPD (chronic obstructive pulmonary disease) (HCC)   . Depression   . Hyperlipidemia   . Hypertension     Past Surgical History:  Procedure Laterality Date  . BACK SURGERY    . NECK SURGERY     Family History: History reviewed. No pertinent family history. Family Psychiatric  History: no history of psychiatric illness in family, no suicides in family, no history of alcohol abuse  in family  Social History:  Social History   Substance and Sexual Activity  Alcohol Use Yes   Comment: Pt drinks daily . unknown amount pt. reports "about 5-6 40 oz beers daily over last week     Social History   Substance and Sexual Activity  Drug Use Yes  . Types: Benzodiazepines   Comment: pt. reports "alprazolam 0.5 TID    Social History   Socioeconomic History  . Marital status: Single    Spouse name: None  . Number of children: None  . Years of education: None  . Highest education level: None  Social Needs  . Financial resource strain: None  . Food insecurity - worry: None  . Food insecurity - inability: None  . Transportation needs - medical: None  . Transportation needs - non-medical: None  Occupational History  . None  Tobacco Use  . Smoking status: Current Every Day Smoker    Packs/day: 1.00    Years: 30.00    Pack years: 30.00    Types: Cigarettes  . Smokeless tobacco: Never Used  Substance and Sexual Activity  . Alcohol use: Yes    Comment: Pt drinks daily . unknown amount pt. reports "about 5-6 40 oz beers daily over last week  . Drug use: Yes    Types: Benzodiazepines    Comment: pt. reports "alprazolam 0.5 TID  . Sexual activity: Not Currently  Other Topics Concern  . 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  Has daughter    Hospital Course:   [facility]: 57 year old single male who [facility]). Pt  states that he is suicidal. Pt denies HI, Pt reports auditory hallucinations. Pt reports history of mental health illness and has been feeling increasingly depressed within the last few days. Pt states he has a plan to take all his Xanax, Pt states " I do not want to live" "pt stated "the pills will put me to sleep and I probably wouldn't wake up". When asked does pt have access to any weapons, pt stated that he has access to a handgun (9  millimeter). Pt stated "something has to give". Pt acknowledges symptoms of crying spells, sadness, hopelessness, difficulty concentrating ,decreased sleep and appetite. Pt stated " I just drank myself to sleep last night" Pt stated that he has been drinking for several years and that he consumes alcohol daily. Pt denies any recent manic episodes, however pt states that he hears voices stating " I need to run". Pt states that he only remembers one other suicide attempt in his 20's of him cutting his wrists.  Pt identifies primary stressor as stress "life in general". Pt states that he worries excessively about what his daughters think about him and the choices that he is making. Pt denies any family history of suicide or use of drugs or alcohol. Pt denies having any current legal problems.  Pt stated that he was recently hospitalized for his mental health at [facility] two weeks ago. Pt stated that he was also hospitalized at [facility] a few years back. Pt is dressed in scrubs, alert, oriented X4 with normal speech, free of movement. Eye contact is good and pt appears to be hopeless. Thought process is coherent and relevant. Pt's mood is depressed and anxious. Pt's insight and judgement is poor. Pt was cooperative throughout assessment. Pt stated he does not feel safe returning back to his apartment at this time and does not want to go there  11/22/17 [facility] MD Assessment: 57 year old male , known to our unit from recent admission. Presented to the ED voluntarily, reporting depression, suicidal ideations. Patient states " I get like that when I am drinking, what I really needed was help to detox". Denies suicidal ideations at this time, and states " I just said those things because I was shaking and needed to get help". Of note he is known to our unit from recent admission form 12/20 - 12/24 . At the time presented with alcohol intoxication, reported xanax overdose and reporting auditory hallucinations. He  was diagnosed with alcohol dependence, depression.   Was detoxified with Ativan protocol. He was discharged on 12/24 on Prozac, Trileptal, Minipress. Patient states that he stopped taking medications after his discharge . States he relapsed on alcohol about 4-5 days ago, drinking about 5 24 ounce beers per day. States he has not been taking any BZDs recently  ( chart notes indicate he had been abusing Xanax prior to his earlier admission)  States " I just started feeling worse ".  Endorses some neuro-vegetative symptoms as below. Of note, denies suicidal ideations, and states that he made suicidal statements in ED because he was intoxicated .  Patient remained on the [facility] unit for 4 days and stabilized with medication and therapy. Patient partially completed Ativan Detox and stopped it early by his choosing. He refused any psychotropic medications. He stabilized and continued denying any SI/HI/AVH and contracts for safety. He showed improvement with improved mood, affect, sleep, appetite, and interaction. Patinet was seen in the day room interacting with peers and staff appropriately. Patient attended  groups and participated. Patient is provided with a prescription for his Albuterol inhaler upon discharge. Patient agrees to follow up at [facility], but he refused all residential programs during his stay.    Physical Findings: AIMS: Facial and Oral Movements Muscles of Facial Expression: None, normal Lips and Perioral Area: None, normal Jaw: None, normal Tongue: None, normal,Extremity Movements Upper (arms, wrists, hands, fingers): None, normal Lower (legs, knees, ankles, toes): None, normal, Trunk Movements Neck, shoulders, hips: None, normal, Overall Severity Severity of abnormal movements (highest score from questions above): None, normal Incapacitation due to abnormal movements: None, normal Patient's awareness of abnormal movements (rate only patient's report): No Awareness, Dental  Status Current problems with teeth and/or dentures?: No Does patient usually wear dentures?: Yes  CIWA:  CIWA-Ar Total: 0 COWS:     Musculoskeletal: Strength & Muscle Tone: within normal limits Gait & Station: normal Patient leans: N/A  Psychiatric Specialty Exam: Physical Exam  Nursing note and vitals reviewed. Constitutional: He is oriented to person, place, and time. He appears well-developed and well-nourished.  Cardiovascular: Normal rate.  Respiratory: Effort normal.  Musculoskeletal: Normal range of motion.  Neurological: He is alert and oriented to person, place, and time.  Skin: Skin is warm.    Review of Systems  Constitutional: Negative.   HENT: Negative.   Eyes: Negative.   Respiratory: Negative.   Cardiovascular: Negative.   Gastrointestinal: Negative.   Genitourinary: Negative.   Musculoskeletal: Negative.   Skin: Negative.   Neurological: Negative.   Endo/Heme/Allergies: Negative.   Psychiatric/Behavioral: Negative.     Blood pressure 123/75, pulse 84, temperature 97.9 F (36.6 C), temperature source Oral, resp. rate 18, height 5' 7.5" (1.715 m), weight 71.1 kg (156 lb 12 oz).Body mass index is 24.19 kg/m.  General Appearance: Casual  Eye Contact:  Good  Speech:  Clear and Coherent and Normal Rate  Volume:  Normal  Mood:  Euthymic  Affect:  Appropriate  Thought Process:  Goal Directed and Descriptions of Associations: Intact  Orientation:  Full (Time, Place, and Person)  Thought Content:  WDL  Suicidal Thoughts:  No  Homicidal Thoughts:  No  Memory:  Immediate;   Good Recent;   Good Remote;   Good  Judgement:  Good  Insight:  Good  Psychomotor Activity:  Normal  Concentration:  Concentration: Good and Attention Span: Good  Recall:  Good  Fund of Knowledge:  Good  Language:  Good  Akathisia:  No  Handed:  Right  AIMS (if indicated):     Assets:  Communication Skills Desire for Improvement Financial Resources/Insurance Housing Physical  Health Social Support Transportation  ADL's:  Intact  Cognition:  WNL  Sleep:  Number of Hours: 6     Have you used any form of tobacco in the last 30 days? (Cigarettes, Smokeless Tobacco, Cigars, and/or Pipes): Yes  Has this patient used any form of tobacco in the last 30 days? (Cigarettes, Smokeless Tobacco, Cigars, and/or Pipes) Yes, Yes, A prescription for an FDA-approved tobacco cessation medication was offered at discharge and the patient refused  Blood Alcohol level:  Lab Results  Component Value Date   ETH 316 (H) 11/26/2012   ETH 212 (H) 03/09/2012    Metabolic Disorder Labs:  Lab Results  Component Value Date   HGBA1C 5.8 (H) 11/13/2017   MPG 119.76 11/13/2017   MPG 123 06/14/2017   No results found for: PROLACTIN Lab Results  Component Value Date   CHOL 146 11/13/2017   TRIG 237 (H) 11/13/2017  HDL 82 11/13/2017   CHOLHDL 1.8 11/13/2017   VLDL 47 (H) 11/13/2017   LDLCALC 17 11/13/2017   LDLCALC 47 06/14/2017    See Psychiatric Specialty Exam and Suicide Risk Assessment completed by Attending Physician prior to discharge.  Discharge destination:  Home  Is patient on multiple antipsychotic therapies at discharge:  No   Has Patient had three or more failed trials of antipsychotic monotherapy by history:  No  Recommended Plan for Multiple Antipsychotic Therapies: NA   Allergies as of 11/26/2017   No Known Allergies     Medication List    STOP taking these medications   ALPRAZolam 0.5 MG tablet Commonly known as:  XANAX   atorvastatin 20 MG tablet Commonly known as:  LIPITOR   FLUoxetine 20 MG capsule Commonly known as:  PROZAC   hydrOXYzine 50 MG tablet Commonly known as:  ATARAX/VISTARIL   mometasone-formoterol 200-5 MCG/ACT Aero Commonly known as:  DULERA   nicotine 21 mg/24hr patch Commonly known as:  NICODERM CQ - dosed in mg/24 hours   Oxcarbazepine 300 MG tablet Commonly known as:  TRILEPTAL   prazosin 1 MG capsule Commonly  known as:  MINIPRESS   tiotropium 18 MCG inhalation capsule Commonly known as:  SPIRIVA   traZODone 100 MG tablet Commonly known as:  DESYREL     TAKE these medications     Indication  albuterol 108 (90 Base) MCG/ACT inhaler Commonly known as:  PROVENTIL HFA;VENTOLIN HFA Inhale 2 puffs into the lungs every 6 (six) hours as needed for wheezing or shortness of breath. What changed:    reasons to take this  additional instructions  Indication:  Asthma, SOB      Follow-up Information    [facility]. Go on 11/27/2017.   Why:  Please attend your follow up appointment at 12:15pm on Friday, November 27, 2017.  Please bring a copy of your hospital discharge paperwork. Contact information: [address] [place] [address] [phone]           Follow-up recommendations:  Continue activity as tolerated. Continue diet as recommended by your PCP. Ensure to keep all appointments with outpatient providers.  Comments:  Patient is instructed prior to discharge to: Take all medications as prescribed by his/her mental healthcare provider. Report any adverse effects and or reactions from the medicines to his/her outpatient provider promptly. Patient has been instructed & cautioned: To not engage in alcohol and or illegal drug use while on prescription medicines. In the event of worsening symptoms, patient is instructed to call the crisis hotline, 911 and or go to the nearest ED for appropriate evaluation and treatment of symptoms. To follow-up with his/her primary care provider for your other medical issues, concerns and or health care needs.    Signed: Gerlene Burdock Lilas Diefendorf, FNP 11/26/2017, 7:53 AM

## 2017-11-26 NOTE — Progress Notes (Signed)
Pt discharged home on a a taxi. Pt was stable and appreciative at that time. All papers were given and valuables returned, prescription for albuterol inhaler given, bur pt refused it. . Verbal understanding expressed. Denies SI/HI and A/VH. Pt given opportunity to express concerns and ask questions.

## 2017-11-26 NOTE — Progress Notes (Signed)
  [facility] :  Will you be returning to the same living situation after discharge:  Yes,  own home At discharge, do you have transportation home?: No. Pt to pay for own cab ride home. Do you have the ability to pay for your medications: Yes,  [facility]  Release of information consent forms completed and in the chart;  Patient's signature needed at discharge.  Patient to Follow up at: Follow-up Information    [facility]. Go on 11/27/2017.   Why:  Please attend your follow up appointment at 12:15pm on Friday, November 27, 2017.  Please bring a copy of your hospital discharge paperwork. Contact information: [address] [place] [address] [phone]           Next level of care provider has [facility] to [facility] Link:no  Safety Planning and Suicide Prevention discussed: No. Pt declined  Have you used any form of tobacco in the last 30 days? (Cigarettes, Smokeless Tobacco, Cigars, and/or Pipes): Yes  Has patient been referred to the Quitline?: Patient refused referral  Patient has been referred for addiction treatment: Yes  Lorri FrederickWierda, Melanie Pellot Jon, LCSW 11/26/2017, 9:35 AM

## 2017-12-02 DIAGNOSIS — Z79899 Other long term (current) drug therapy: Secondary | ICD-10-CM | POA: Diagnosis not present

## 2017-12-02 DIAGNOSIS — T50904A Poisoning by unspecified drugs, medicaments and biological substances, undetermined, initial encounter: Secondary | ICD-10-CM | POA: Diagnosis not present

## 2017-12-02 DIAGNOSIS — R0602 Shortness of breath: Secondary | ICD-10-CM | POA: Diagnosis not present

## 2017-12-03 DIAGNOSIS — R0602 Shortness of breath: Secondary | ICD-10-CM | POA: Diagnosis not present

## 2017-12-04 DIAGNOSIS — I10 Essential (primary) hypertension: Secondary | ICD-10-CM | POA: Diagnosis not present

## 2017-12-04 DIAGNOSIS — K219 Gastro-esophageal reflux disease without esophagitis: Secondary | ICD-10-CM | POA: Diagnosis not present

## 2017-12-04 DIAGNOSIS — Z79899 Other long term (current) drug therapy: Secondary | ICD-10-CM | POA: Diagnosis not present

## 2017-12-04 DIAGNOSIS — J449 Chronic obstructive pulmonary disease, unspecified: Secondary | ICD-10-CM | POA: Diagnosis not present

## 2017-12-21 DIAGNOSIS — I10 Essential (primary) hypertension: Secondary | ICD-10-CM | POA: Diagnosis not present

## 2017-12-21 DIAGNOSIS — J449 Chronic obstructive pulmonary disease, unspecified: Secondary | ICD-10-CM | POA: Diagnosis not present

## 2017-12-21 DIAGNOSIS — K219 Gastro-esophageal reflux disease without esophagitis: Secondary | ICD-10-CM | POA: Diagnosis not present

## 2017-12-21 DIAGNOSIS — E7800 Pure hypercholesterolemia, unspecified: Secondary | ICD-10-CM | POA: Diagnosis not present

## 2017-12-28 DIAGNOSIS — R05 Cough: Secondary | ICD-10-CM | POA: Diagnosis not present

## 2017-12-28 DIAGNOSIS — N200 Calculus of kidney: Secondary | ICD-10-CM | POA: Diagnosis not present

## 2017-12-28 DIAGNOSIS — R10819 Abdominal tenderness, unspecified site: Secondary | ICD-10-CM | POA: Diagnosis not present

## 2017-12-28 DIAGNOSIS — I10 Essential (primary) hypertension: Secondary | ICD-10-CM | POA: Diagnosis not present

## 2017-12-28 DIAGNOSIS — Z008 Encounter for other general examination: Secondary | ICD-10-CM | POA: Diagnosis not present

## 2017-12-28 DIAGNOSIS — Z5321 Procedure and treatment not carried out due to patient leaving prior to being seen by health care provider: Secondary | ICD-10-CM | POA: Diagnosis not present

## 2017-12-28 DIAGNOSIS — R0602 Shortness of breath: Secondary | ICD-10-CM | POA: Diagnosis not present

## 2017-12-28 DIAGNOSIS — J449 Chronic obstructive pulmonary disease, unspecified: Secondary | ICD-10-CM | POA: Diagnosis not present

## 2017-12-28 DIAGNOSIS — Z79899 Other long term (current) drug therapy: Secondary | ICD-10-CM | POA: Diagnosis not present

## 2017-12-29 DIAGNOSIS — R52 Pain, unspecified: Secondary | ICD-10-CM | POA: Diagnosis not present

## 2017-12-29 DIAGNOSIS — R1031 Right lower quadrant pain: Secondary | ICD-10-CM | POA: Diagnosis not present

## 2018-01-01 DIAGNOSIS — F10239 Alcohol dependence with withdrawal, unspecified: Secondary | ICD-10-CM

## 2018-01-01 DIAGNOSIS — I10 Essential (primary) hypertension: Secondary | ICD-10-CM | POA: Diagnosis not present

## 2018-01-01 DIAGNOSIS — R51 Headache: Secondary | ICD-10-CM | POA: Diagnosis not present

## 2018-01-01 DIAGNOSIS — E7800 Pure hypercholesterolemia, unspecified: Secondary | ICD-10-CM | POA: Diagnosis not present

## 2018-01-01 DIAGNOSIS — R740 Nonspecific elevation of levels of transaminase and lactic acid dehydrogenase [LDH]: Secondary | ICD-10-CM | POA: Diagnosis not present

## 2018-01-01 DIAGNOSIS — F1010 Alcohol abuse, uncomplicated: Secondary | ICD-10-CM

## 2018-01-01 DIAGNOSIS — R05 Cough: Secondary | ICD-10-CM | POA: Diagnosis not present

## 2018-01-01 DIAGNOSIS — J449 Chronic obstructive pulmonary disease, unspecified: Secondary | ICD-10-CM | POA: Diagnosis not present

## 2018-01-01 DIAGNOSIS — Z9119 Patient's noncompliance with other medical treatment and regimen: Secondary | ICD-10-CM | POA: Diagnosis not present

## 2018-01-01 DIAGNOSIS — R45851 Suicidal ideations: Secondary | ICD-10-CM

## 2018-01-07 DIAGNOSIS — Z79899 Other long term (current) drug therapy: Secondary | ICD-10-CM | POA: Diagnosis not present

## 2018-01-07 DIAGNOSIS — I10 Essential (primary) hypertension: Secondary | ICD-10-CM | POA: Diagnosis not present

## 2018-01-07 DIAGNOSIS — E7800 Pure hypercholesterolemia, unspecified: Secondary | ICD-10-CM | POA: Diagnosis not present

## 2018-01-07 DIAGNOSIS — K219 Gastro-esophageal reflux disease without esophagitis: Secondary | ICD-10-CM | POA: Diagnosis not present

## 2018-01-18 DIAGNOSIS — E559 Vitamin D deficiency, unspecified: Secondary | ICD-10-CM | POA: Diagnosis not present

## 2018-01-18 DIAGNOSIS — R5383 Other fatigue: Secondary | ICD-10-CM | POA: Diagnosis not present

## 2018-01-18 DIAGNOSIS — J301 Allergic rhinitis due to pollen: Secondary | ICD-10-CM | POA: Diagnosis not present

## 2018-01-18 DIAGNOSIS — J4530 Mild persistent asthma, uncomplicated: Secondary | ICD-10-CM | POA: Diagnosis not present

## 2018-01-20 DIAGNOSIS — J069 Acute upper respiratory infection, unspecified: Secondary | ICD-10-CM | POA: Diagnosis not present

## 2018-01-26 DIAGNOSIS — J449 Chronic obstructive pulmonary disease, unspecified: Secondary | ICD-10-CM | POA: Diagnosis not present

## 2018-01-26 DIAGNOSIS — E782 Mixed hyperlipidemia: Secondary | ICD-10-CM | POA: Diagnosis not present

## 2018-01-26 DIAGNOSIS — I10 Essential (primary) hypertension: Secondary | ICD-10-CM | POA: Diagnosis not present

## 2018-01-26 DIAGNOSIS — E559 Vitamin D deficiency, unspecified: Secondary | ICD-10-CM | POA: Diagnosis not present

## 2018-02-15 DIAGNOSIS — J301 Allergic rhinitis due to pollen: Secondary | ICD-10-CM | POA: Diagnosis not present

## 2018-02-15 DIAGNOSIS — I10 Essential (primary) hypertension: Secondary | ICD-10-CM | POA: Diagnosis not present

## 2018-02-15 DIAGNOSIS — Z5181 Encounter for therapeutic drug level monitoring: Secondary | ICD-10-CM | POA: Diagnosis not present

## 2018-02-15 DIAGNOSIS — G2581 Restless legs syndrome: Secondary | ICD-10-CM | POA: Diagnosis not present

## 2018-02-15 DIAGNOSIS — R454 Irritability and anger: Secondary | ICD-10-CM | POA: Diagnosis not present

## 2018-02-15 DIAGNOSIS — J4530 Mild persistent asthma, uncomplicated: Secondary | ICD-10-CM | POA: Diagnosis not present

## 2018-03-08 DIAGNOSIS — G2581 Restless legs syndrome: Secondary | ICD-10-CM | POA: Diagnosis not present

## 2018-03-08 DIAGNOSIS — J4530 Mild persistent asthma, uncomplicated: Secondary | ICD-10-CM | POA: Diagnosis not present

## 2018-03-08 DIAGNOSIS — J301 Allergic rhinitis due to pollen: Secondary | ICD-10-CM | POA: Diagnosis not present

## 2018-04-05 DIAGNOSIS — J301 Allergic rhinitis due to pollen: Secondary | ICD-10-CM | POA: Diagnosis not present

## 2018-04-05 DIAGNOSIS — G2581 Restless legs syndrome: Secondary | ICD-10-CM | POA: Diagnosis not present

## 2018-04-05 DIAGNOSIS — J4530 Mild persistent asthma, uncomplicated: Secondary | ICD-10-CM | POA: Diagnosis not present

## 2018-04-09 DIAGNOSIS — M549 Dorsalgia, unspecified: Secondary | ICD-10-CM | POA: Diagnosis not present

## 2018-04-28 DIAGNOSIS — J449 Chronic obstructive pulmonary disease, unspecified: Secondary | ICD-10-CM | POA: Diagnosis not present

## 2018-04-28 DIAGNOSIS — I10 Essential (primary) hypertension: Secondary | ICD-10-CM | POA: Diagnosis not present

## 2018-04-28 DIAGNOSIS — E559 Vitamin D deficiency, unspecified: Secondary | ICD-10-CM | POA: Diagnosis not present

## 2018-04-28 DIAGNOSIS — E782 Mixed hyperlipidemia: Secondary | ICD-10-CM | POA: Diagnosis not present

## 2018-05-03 DIAGNOSIS — G2581 Restless legs syndrome: Secondary | ICD-10-CM | POA: Diagnosis not present

## 2018-05-03 DIAGNOSIS — J301 Allergic rhinitis due to pollen: Secondary | ICD-10-CM | POA: Diagnosis not present

## 2018-05-03 DIAGNOSIS — J4530 Mild persistent asthma, uncomplicated: Secondary | ICD-10-CM | POA: Diagnosis not present

## 2018-07-01 DIAGNOSIS — M549 Dorsalgia, unspecified: Secondary | ICD-10-CM | POA: Diagnosis not present

## 2018-07-01 DIAGNOSIS — G4700 Insomnia, unspecified: Secondary | ICD-10-CM | POA: Diagnosis not present

## 2018-07-05 DIAGNOSIS — I10 Essential (primary) hypertension: Secondary | ICD-10-CM | POA: Diagnosis not present

## 2018-07-05 DIAGNOSIS — J4530 Mild persistent asthma, uncomplicated: Secondary | ICD-10-CM | POA: Diagnosis not present

## 2018-07-05 DIAGNOSIS — G2581 Restless legs syndrome: Secondary | ICD-10-CM | POA: Diagnosis not present

## 2018-07-05 DIAGNOSIS — R454 Irritability and anger: Secondary | ICD-10-CM | POA: Diagnosis not present

## 2018-07-05 DIAGNOSIS — R5383 Other fatigue: Secondary | ICD-10-CM | POA: Diagnosis not present

## 2018-07-29 DIAGNOSIS — I10 Essential (primary) hypertension: Secondary | ICD-10-CM | POA: Diagnosis not present

## 2018-07-29 DIAGNOSIS — J449 Chronic obstructive pulmonary disease, unspecified: Secondary | ICD-10-CM | POA: Diagnosis not present

## 2018-07-29 DIAGNOSIS — E782 Mixed hyperlipidemia: Secondary | ICD-10-CM | POA: Diagnosis not present

## 2018-07-29 DIAGNOSIS — E559 Vitamin D deficiency, unspecified: Secondary | ICD-10-CM | POA: Diagnosis not present

## 2018-08-09 DIAGNOSIS — G2581 Restless legs syndrome: Secondary | ICD-10-CM | POA: Diagnosis not present

## 2018-08-09 DIAGNOSIS — J301 Allergic rhinitis due to pollen: Secondary | ICD-10-CM | POA: Diagnosis not present

## 2018-08-09 DIAGNOSIS — J4530 Mild persistent asthma, uncomplicated: Secondary | ICD-10-CM | POA: Diagnosis not present

## 2018-09-12 DIAGNOSIS — Z743 Need for continuous supervision: Secondary | ICD-10-CM | POA: Diagnosis not present

## 2018-09-12 DIAGNOSIS — R0902 Hypoxemia: Secondary | ICD-10-CM | POA: Diagnosis not present

## 2018-09-12 DIAGNOSIS — S066X0A Traumatic subarachnoid hemorrhage without loss of consciousness, initial encounter: Secondary | ICD-10-CM | POA: Diagnosis not present

## 2018-09-12 DIAGNOSIS — S060X1A Concussion with loss of consciousness of 30 minutes or less, initial encounter: Secondary | ICD-10-CM | POA: Diagnosis not present

## 2018-09-12 DIAGNOSIS — S299XXA Unspecified injury of thorax, initial encounter: Secondary | ICD-10-CM | POA: Diagnosis not present

## 2018-09-12 DIAGNOSIS — R4020 Unspecified coma: Secondary | ICD-10-CM | POA: Diagnosis not present

## 2018-09-12 DIAGNOSIS — W19XXXA Unspecified fall, initial encounter: Secondary | ICD-10-CM | POA: Diagnosis not present

## 2018-09-12 DIAGNOSIS — S066X1A Traumatic subarachnoid hemorrhage with loss of consciousness of 30 minutes or less, initial encounter: Secondary | ICD-10-CM | POA: Diagnosis not present

## 2018-09-13 DIAGNOSIS — S299XXA Unspecified injury of thorax, initial encounter: Secondary | ICD-10-CM | POA: Diagnosis not present

## 2018-09-13 DIAGNOSIS — I10 Essential (primary) hypertension: Secondary | ICD-10-CM | POA: Diagnosis not present

## 2018-09-13 DIAGNOSIS — Z8669 Personal history of other diseases of the nervous system and sense organs: Secondary | ICD-10-CM | POA: Diagnosis not present

## 2018-09-13 DIAGNOSIS — S0003XA Contusion of scalp, initial encounter: Secondary | ICD-10-CM | POA: Diagnosis not present

## 2018-09-13 DIAGNOSIS — G2581 Restless legs syndrome: Secondary | ICD-10-CM | POA: Diagnosis not present

## 2018-09-13 DIAGNOSIS — Z79899 Other long term (current) drug therapy: Secondary | ICD-10-CM | POA: Diagnosis not present

## 2018-09-13 DIAGNOSIS — Z720 Tobacco use: Secondary | ICD-10-CM | POA: Diagnosis not present

## 2018-09-13 DIAGNOSIS — W19XXXA Unspecified fall, initial encounter: Secondary | ICD-10-CM | POA: Diagnosis not present

## 2018-09-13 DIAGNOSIS — E876 Hypokalemia: Secondary | ICD-10-CM | POA: Diagnosis not present

## 2018-09-13 DIAGNOSIS — S066X1A Traumatic subarachnoid hemorrhage with loss of consciousness of 30 minutes or less, initial encounter: Secondary | ICD-10-CM | POA: Diagnosis not present

## 2018-09-13 DIAGNOSIS — E785 Hyperlipidemia, unspecified: Secondary | ICD-10-CM | POA: Diagnosis not present

## 2018-09-13 DIAGNOSIS — S066X0A Traumatic subarachnoid hemorrhage without loss of consciousness, initial encounter: Secondary | ICD-10-CM | POA: Diagnosis not present

## 2018-09-13 DIAGNOSIS — Z23 Encounter for immunization: Secondary | ICD-10-CM | POA: Diagnosis not present

## 2018-09-13 DIAGNOSIS — S060X1A Concussion with loss of consciousness of 30 minutes or less, initial encounter: Secondary | ICD-10-CM | POA: Diagnosis not present

## 2018-09-13 DIAGNOSIS — J449 Chronic obstructive pulmonary disease, unspecified: Secondary | ICD-10-CM | POA: Diagnosis not present

## 2018-09-13 DIAGNOSIS — S066X9A Traumatic subarachnoid hemorrhage with loss of consciousness of unspecified duration, initial encounter: Secondary | ICD-10-CM | POA: Diagnosis not present

## 2018-09-13 DIAGNOSIS — F10929 Alcohol use, unspecified with intoxication, unspecified: Secondary | ICD-10-CM

## 2018-09-13 DIAGNOSIS — E7800 Pure hypercholesterolemia, unspecified: Secondary | ICD-10-CM | POA: Diagnosis not present

## 2018-09-15 DIAGNOSIS — S066X9A Traumatic subarachnoid hemorrhage with loss of consciousness of unspecified duration, initial encounter: Secondary | ICD-10-CM | POA: Diagnosis not present

## 2018-09-22 DIAGNOSIS — M545 Low back pain: Secondary | ICD-10-CM | POA: Diagnosis not present

## 2018-09-22 DIAGNOSIS — Z5329 Procedure and treatment not carried out because of patient's decision for other reasons: Secondary | ICD-10-CM | POA: Diagnosis not present

## 2018-09-22 DIAGNOSIS — W19XXXA Unspecified fall, initial encounter: Secondary | ICD-10-CM | POA: Diagnosis not present

## 2018-09-22 DIAGNOSIS — Z743 Need for continuous supervision: Secondary | ICD-10-CM | POA: Diagnosis not present

## 2018-09-25 DIAGNOSIS — I252 Old myocardial infarction: Secondary | ICD-10-CM | POA: Diagnosis not present

## 2018-09-25 DIAGNOSIS — J449 Chronic obstructive pulmonary disease, unspecified: Secondary | ICD-10-CM | POA: Diagnosis not present

## 2018-09-25 DIAGNOSIS — R05 Cough: Secondary | ICD-10-CM | POA: Diagnosis not present

## 2018-09-25 DIAGNOSIS — K219 Gastro-esophageal reflux disease without esophagitis: Secondary | ICD-10-CM | POA: Diagnosis not present

## 2018-09-25 DIAGNOSIS — Z79899 Other long term (current) drug therapy: Secondary | ICD-10-CM | POA: Diagnosis not present

## 2018-09-25 DIAGNOSIS — I10 Essential (primary) hypertension: Secondary | ICD-10-CM | POA: Diagnosis not present

## 2018-09-25 DIAGNOSIS — R000 Tachycardia, unspecified: Secondary | ICD-10-CM | POA: Diagnosis not present

## 2018-10-02 ENCOUNTER — Other Ambulatory Visit: Payer: Self-pay

## 2018-10-02 ENCOUNTER — Encounter (HOSPITAL_COMMUNITY): Payer: Self-pay | Admitting: *Deleted

## 2018-10-02 ENCOUNTER — Emergency Department (HOSPITAL_COMMUNITY)
Admission: EM | Admit: 2018-10-02 | Discharge: 2018-10-02 | Disposition: A | Discharge status: Home / Self Care | Payer: Medicare Other | Attending: Emergency Medicine | Admitting: Emergency Medicine

## 2018-10-02 DIAGNOSIS — I10 Essential (primary) hypertension: Secondary | ICD-10-CM | POA: Insufficient documentation

## 2018-10-02 DIAGNOSIS — J449 Chronic obstructive pulmonary disease, unspecified: Secondary | ICD-10-CM | POA: Diagnosis not present

## 2018-10-02 DIAGNOSIS — I959 Hypotension, unspecified: Secondary | ICD-10-CM | POA: Diagnosis not present

## 2018-10-02 DIAGNOSIS — R42 Dizziness and giddiness: Secondary | ICD-10-CM | POA: Diagnosis not present

## 2018-10-02 DIAGNOSIS — E860 Dehydration: Secondary | ICD-10-CM | POA: Insufficient documentation

## 2018-10-02 DIAGNOSIS — F17210 Nicotine dependence, cigarettes, uncomplicated: Secondary | ICD-10-CM | POA: Diagnosis not present

## 2018-10-02 LAB — CBC WITH DIFFERENTIAL/PLATELET
Abs Immature Granulocytes: 0.04 10*3/uL (ref 0.00–0.07)
BASOS PCT: 0 %
Basophils Absolute: 0 10*3/uL (ref 0.0–0.1)
Eosinophils Absolute: 0.2 10*3/uL (ref 0.0–0.5)
Eosinophils Relative: 2 %
HEMATOCRIT: 48.6 % (ref 39.0–52.0)
Hemoglobin: 15.8 g/dL (ref 13.0–17.0)
Immature Granulocytes: 0 %
Lymphocytes Relative: 23 %
Lymphs Abs: 2.3 10*3/uL (ref 0.7–4.0)
MCH: 31 pg (ref 26.0–34.0)
MCHC: 32.5 g/dL (ref 30.0–36.0)
MCV: 95.5 fL (ref 80.0–100.0)
MONO ABS: 1.3 10*3/uL — AB (ref 0.1–1.0)
MONOS PCT: 13 %
Neutro Abs: 6.2 10*3/uL (ref 1.7–7.7)
Neutrophils Relative %: 62 %
PLATELETS: 193 10*3/uL (ref 150–400)
RBC: 5.09 MIL/uL (ref 4.22–5.81)
RDW: 13.8 % (ref 11.5–15.5)
WBC: 10.1 10*3/uL (ref 4.0–10.5)
nRBC: 0 % (ref 0.0–0.2)

## 2018-10-02 LAB — BASIC METABOLIC PANEL
Anion gap: 14 (ref 5–15)
BUN: 10 mg/dL (ref 6–20)
CHLORIDE: 98 mmol/L (ref 98–111)
CO2: 27 mmol/L (ref 22–32)
Calcium: 9.2 mg/dL (ref 8.9–10.3)
Creatinine, Ser: 0.84 mg/dL (ref 0.61–1.24)
GFR calc Af Amer: >60 mL/min (ref >60)
GFR calc non Af Amer: >60 mL/min (ref >60)
GLUCOSE: 139 mg/dL — AB (ref 70–99)
Potassium: 2.9 mmol/L — ABNORMAL LOW (ref 3.5–5.1)
Sodium: 139 mmol/L (ref 135–145)

## 2018-10-02 LAB — I-STAT TROPONIN, ED: Troponin i, poc: 0 ng/mL (ref 0.00–0.08)

## 2018-10-02 MED ORDER — SODIUM CHLORIDE 0.9 % IV SOLN: INTRAVENOUS | Status: DC

## 2018-10-02 MED ORDER — ACETAMINOPHEN 325 MG PO TABS
650.0000 mg | ORAL_TABLET | Freq: Once | ORAL | Status: AC
  Administered 2018-10-02: 650 mg via ORAL
  Filled 2018-10-02: qty 2

## 2018-10-02 MED ORDER — POTASSIUM CHLORIDE CRYS ER 20 MEQ PO TBCR
40.0000 meq | EXTENDED_RELEASE_TABLET | Freq: Once | ORAL | Status: AC
  Administered 2018-10-02: 40 meq via ORAL
  Filled 2018-10-02: qty 2

## 2018-10-02 MED ORDER — SODIUM CHLORIDE 0.9 % IV BOLUS
2000.0000 mL | Freq: Once | INTRAVENOUS | Status: AC
  Administered 2018-10-02: 2000 mL via INTRAVENOUS

## 2018-10-02 NOTE — ED Triage Notes (Signed)
Pt states that he was sitting in a chair in an AA meeting and became hot, dizzy and sweaty. Started having some blurry vision. He said he got up and went outside to sit on a bench. He says that he fell backwards off the bench onto the ground. No LOC. Does c/o "migraine headache" but no hx of the same. Pt reports he is 4 days sober ETOH.

## 2018-10-02 NOTE — ED Notes (Signed)
Bed: WA17 Expected date:  Expected time:  Means of arrival:  Comments: EMS 57 yo male with orthostatic hypotension-SBP 60's with sitting

## 2018-10-02 NOTE — ED Provider Notes (Signed)
[facility]-EMERGENCY DEPT Provider Note   CSN: [id number] Arrival date & time: 10/02/18  2059     History   Chief Complaint No chief complaint on file.   HPI EMIT KUENZEL is a 57 y.o. male.  57 year old male who presents with hypotension while attending a meeting.  Patient just completed a 4-day stay for alcohol detox.  Denies any vomiting or black stools.  Does take diuretics chronically and he has been drinking lots of coffee.  Today became dizzy lightheaded while sitting down.  EMS called and patient's blood pressure found to be 60.  Was given 250 cc of saline and blood pressure improved to 103.  Patient feels much better at this time.     Past Medical History:  Diagnosis Date  . Arthritis   . COPD (chronic obstructive pulmonary disease) (HCC)   . Depression   . Hyperlipidemia   . Hypertension     Patient Active Problem List   Diagnosis Date Noted  . MDD (major depressive disorder), severe (HCC) 11/22/2017  . Alcohol abuse with intoxication (HCC)   . Severe recurrent major depression without psychotic features (HCC) 06/13/2017  . Alcohol dependence (HCC) 11/27/2012  . GERD (gastroesophageal reflux disease) 02/12/2012  . Hypertension 01/06/2012  . COPD (chronic obstructive pulmonary disease) (HCC) 01/06/2012  . Hyperlipidemia 01/06/2012  . Anxiety 01/06/2012  . Neck pain, chronic 01/06/2012    Past Surgical History:  Procedure Laterality Date  . BACK SURGERY    . NECK SURGERY          Home Medications    Prior to Admission medications   Medication Sig Start Date End Date Taking? Authorizing Provider  albuterol (PROVENTIL HFA;VENTOLIN HFA) 108 (90 Base) MCG/ACT inhaler Inhale 2 puffs into the lungs every 6 (six) hours as needed for wheezing or shortness of breath. 11/26/17   Money, Gerlene Burdock, FNP    Family History No family history on file.  Social History Social History   Tobacco Use  . Smoking status: Current Every Day Smoker     Packs/day: 1.00    Years: 30.00    Pack years: 30.00    Types: Cigarettes  . Smokeless tobacco: Never Used  Substance Use Topics  . Alcohol use: Yes    Comment: Pt drinks daily . unknown amount pt. reports "about 5-6 40 oz beers daily over last week  . Drug use: Yes    Types: Benzodiazepines    Comment: pt. reports "alprazolam 0.5 TID     Allergies   Patient has no known allergies.   Review of Systems Review of Systems  All other systems reviewed and are negative.    Physical Exam Updated Vital Signs There were no vitals taken for this visit.  Physical Exam  Constitutional: He is oriented to person, place, and time. He appears well-developed and well-nourished.  Non-toxic appearance. No distress.  HENT:  Head: Normocephalic and atraumatic.  Eyes: Pupils are equal, round, and reactive to light. Conjunctivae, EOM and lids are normal.  Neck: Normal range of motion. Neck supple. No tracheal deviation present. No thyroid mass present.  Cardiovascular: Normal rate, regular rhythm and normal heart sounds. Exam reveals no gallop.  No murmur heard. Pulmonary/Chest: Effort normal and breath sounds normal. No stridor. No respiratory distress. He has no decreased breath sounds. He has no wheezes. He has no rhonchi. He has no rales.  Abdominal: Soft. Normal appearance and bowel sounds are normal. He exhibits no distension. There is no tenderness. There  is no rebound and no CVA tenderness.  Musculoskeletal: Normal range of motion. He exhibits no edema or tenderness.  Neurological: He is alert and oriented to person, place, and time. He has normal strength. No cranial nerve deficit or sensory deficit. GCS eye subscore is 4. GCS verbal subscore is 5. GCS motor subscore is 6.  Skin: Skin is warm and dry. No abrasion and no rash noted.  Psychiatric: He has a normal mood and affect. His speech is normal and behavior is normal.  Nursing note and vitals reviewed.    ED Treatments /  Results  Labs (all labs ordered are listed, but only abnormal results are displayed) Labs Reviewed - No data to display  EKG None  Radiology No results found.  Procedures Procedures (including critical care time)  Medications Ordered in ED Medications  sodium chloride 0.9 % bolus 2,000 mL (has no administration in time range)  0.9 %  sodium chloride infusion (has no administration in time range)   ED ECG REPORT   Date: 10/02/2018  Rate: 72  Rhythm: normal sinus rhythm  QRS Axis: normal  Intervals: QT prolonged  ST/T Wave abnormalities: normal  Conduction Disutrbances:none  Narrative Interpretation:   Old EKG Reviewed: none available  I have personally reviewed the EKG tracing and agree with the computerized printout as noted.   Initial Impression / Assessment and Plan / ED Course  I have reviewed the triage vital signs and the nursing notes.  Pertinent labs & imaging results that were available during my care of the patient were reviewed by me and considered in my medical decision making (see chart for details).     Patient initially orthostatic and responded with IV fluids.  Suspect that he was dehydrated.  Patient with mild hypokalemia treated with oral potassium.  Renal function within normal limits.  Repeat blood pressure shows that he is no longer orthostatic.  Patient stable for discharge  Final Clinical Impressions(s) / ED Diagnoses   Final diagnoses:  None    ED Discharge Orders    None       Lorre Nick, MD 10/02/18 2240

## 2018-10-02 NOTE — ED Triage Notes (Signed)
Pt arrives via EMS with c/o dizziness. On arrival to scene, pt was diaphoretic and bp palpated in the 50's. Pt was given 250ns en route and improved to 132/60. Pt has had increased caffeine intake over the past several days.

## 2018-10-11 DIAGNOSIS — Z23 Encounter for immunization: Secondary | ICD-10-CM | POA: Diagnosis not present

## 2018-10-11 DIAGNOSIS — I10 Essential (primary) hypertension: Secondary | ICD-10-CM | POA: Diagnosis not present

## 2018-10-11 DIAGNOSIS — J4540 Moderate persistent asthma, uncomplicated: Secondary | ICD-10-CM | POA: Diagnosis not present

## 2018-10-11 DIAGNOSIS — J301 Allergic rhinitis due to pollen: Secondary | ICD-10-CM | POA: Diagnosis not present

## 2018-10-11 DIAGNOSIS — R05 Cough: Secondary | ICD-10-CM | POA: Diagnosis not present

## 2018-10-11 DIAGNOSIS — R454 Irritability and anger: Secondary | ICD-10-CM | POA: Diagnosis not present

## 2018-10-11 DIAGNOSIS — G2581 Restless legs syndrome: Secondary | ICD-10-CM | POA: Diagnosis not present

## 2018-10-28 DIAGNOSIS — E782 Mixed hyperlipidemia: Secondary | ICD-10-CM | POA: Diagnosis not present

## 2018-10-28 DIAGNOSIS — I10 Essential (primary) hypertension: Secondary | ICD-10-CM | POA: Diagnosis not present

## 2018-10-28 DIAGNOSIS — J449 Chronic obstructive pulmonary disease, unspecified: Secondary | ICD-10-CM | POA: Diagnosis not present

## 2018-10-28 DIAGNOSIS — E559 Vitamin D deficiency, unspecified: Secondary | ICD-10-CM | POA: Diagnosis not present

## 2018-11-29 DIAGNOSIS — Z765 Malingerer [conscious simulation]: Secondary | ICD-10-CM | POA: Diagnosis not present

## 2018-11-29 DIAGNOSIS — Z79899 Other long term (current) drug therapy: Secondary | ICD-10-CM | POA: Diagnosis not present

## 2018-12-01 DIAGNOSIS — Z765 Malingerer [conscious simulation]: Secondary | ICD-10-CM | POA: Diagnosis not present

## 2018-12-04 DIAGNOSIS — R52 Pain, unspecified: Secondary | ICD-10-CM | POA: Diagnosis not present

## 2018-12-04 DIAGNOSIS — M5489 Other dorsalgia: Secondary | ICD-10-CM | POA: Diagnosis not present

## 2018-12-04 DIAGNOSIS — J9601 Acute respiratory failure with hypoxia: Secondary | ICD-10-CM | POA: Diagnosis not present

## 2018-12-04 DIAGNOSIS — S82821A Torus fracture of lower end of right fibula, initial encounter for closed fracture: Secondary | ICD-10-CM | POA: Diagnosis not present

## 2018-12-04 DIAGNOSIS — R000 Tachycardia, unspecified: Secondary | ICD-10-CM | POA: Diagnosis not present

## 2018-12-04 DIAGNOSIS — W19XXXA Unspecified fall, initial encounter: Secondary | ICD-10-CM | POA: Diagnosis not present

## 2018-12-04 DIAGNOSIS — S0990XA Unspecified injury of head, initial encounter: Secondary | ICD-10-CM | POA: Diagnosis not present

## 2018-12-04 DIAGNOSIS — S82421A Displaced transverse fracture of shaft of right fibula, initial encounter for closed fracture: Secondary | ICD-10-CM | POA: Diagnosis not present

## 2018-12-04 DIAGNOSIS — S82241A Displaced spiral fracture of shaft of right tibia, initial encounter for closed fracture: Secondary | ICD-10-CM | POA: Diagnosis not present

## 2018-12-05 DIAGNOSIS — J9601 Acute respiratory failure with hypoxia: Secondary | ICD-10-CM | POA: Diagnosis not present

## 2018-12-05 DIAGNOSIS — F10121 Alcohol abuse with intoxication delirium: Secondary | ICD-10-CM | POA: Diagnosis not present

## 2018-12-05 DIAGNOSIS — Z741 Need for assistance with personal care: Secondary | ICD-10-CM | POA: Diagnosis not present

## 2018-12-05 DIAGNOSIS — R0602 Shortness of breath: Secondary | ICD-10-CM | POA: Diagnosis not present

## 2018-12-05 DIAGNOSIS — F329 Major depressive disorder, single episode, unspecified: Secondary | ICD-10-CM

## 2018-12-05 DIAGNOSIS — D62 Acute posthemorrhagic anemia: Secondary | ICD-10-CM | POA: Diagnosis not present

## 2018-12-05 DIAGNOSIS — S82401A Unspecified fracture of shaft of right fibula, initial encounter for closed fracture: Secondary | ICD-10-CM | POA: Diagnosis not present

## 2018-12-05 DIAGNOSIS — S82201A Unspecified fracture of shaft of right tibia, initial encounter for closed fracture: Secondary | ICD-10-CM | POA: Diagnosis not present

## 2018-12-05 DIAGNOSIS — S0990XA Unspecified injury of head, initial encounter: Secondary | ICD-10-CM | POA: Diagnosis not present

## 2018-12-05 DIAGNOSIS — Z452 Encounter for adjustment and management of vascular access device: Secondary | ICD-10-CM | POA: Diagnosis not present

## 2018-12-05 DIAGNOSIS — D649 Anemia, unspecified: Secondary | ICD-10-CM | POA: Diagnosis not present

## 2018-12-05 DIAGNOSIS — F10929 Alcohol use, unspecified with intoxication, unspecified: Secondary | ICD-10-CM | POA: Diagnosis not present

## 2018-12-05 DIAGNOSIS — J690 Pneumonitis due to inhalation of food and vomit: Secondary | ICD-10-CM | POA: Diagnosis not present

## 2018-12-05 DIAGNOSIS — E119 Type 2 diabetes mellitus without complications: Secondary | ICD-10-CM | POA: Diagnosis not present

## 2018-12-05 DIAGNOSIS — I252 Old myocardial infarction: Secondary | ICD-10-CM | POA: Diagnosis not present

## 2018-12-05 DIAGNOSIS — R000 Tachycardia, unspecified: Secondary | ICD-10-CM | POA: Diagnosis not present

## 2018-12-05 DIAGNOSIS — J9811 Atelectasis: Secondary | ICD-10-CM | POA: Diagnosis not present

## 2018-12-05 DIAGNOSIS — F10239 Alcohol dependence with withdrawal, unspecified: Secondary | ICD-10-CM | POA: Diagnosis not present

## 2018-12-05 DIAGNOSIS — Z79899 Other long term (current) drug therapy: Secondary | ICD-10-CM | POA: Diagnosis not present

## 2018-12-05 DIAGNOSIS — S82831A Other fracture of upper and lower end of right fibula, initial encounter for closed fracture: Secondary | ICD-10-CM | POA: Diagnosis not present

## 2018-12-05 DIAGNOSIS — S82421A Displaced transverse fracture of shaft of right fibula, initial encounter for closed fracture: Secondary | ICD-10-CM | POA: Diagnosis not present

## 2018-12-05 DIAGNOSIS — S82391A Other fracture of lower end of right tibia, initial encounter for closed fracture: Secondary | ICD-10-CM | POA: Diagnosis not present

## 2018-12-05 DIAGNOSIS — J449 Chronic obstructive pulmonary disease, unspecified: Secondary | ICD-10-CM | POA: Diagnosis not present

## 2018-12-05 DIAGNOSIS — S82821A Torus fracture of lower end of right fibula, initial encounter for closed fracture: Secondary | ICD-10-CM | POA: Diagnosis not present

## 2018-12-05 DIAGNOSIS — R269 Unspecified abnormalities of gait and mobility: Secondary | ICD-10-CM | POA: Diagnosis not present

## 2018-12-05 DIAGNOSIS — M6281 Muscle weakness (generalized): Secondary | ICD-10-CM | POA: Diagnosis not present

## 2018-12-05 DIAGNOSIS — J45909 Unspecified asthma, uncomplicated: Secondary | ICD-10-CM | POA: Diagnosis not present

## 2018-12-05 DIAGNOSIS — K219 Gastro-esophageal reflux disease without esophagitis: Secondary | ICD-10-CM | POA: Diagnosis not present

## 2018-12-05 DIAGNOSIS — R279 Unspecified lack of coordination: Secondary | ICD-10-CM | POA: Diagnosis not present

## 2018-12-05 DIAGNOSIS — G9341 Metabolic encephalopathy: Secondary | ICD-10-CM | POA: Diagnosis not present

## 2018-12-05 DIAGNOSIS — I10 Essential (primary) hypertension: Secondary | ICD-10-CM | POA: Diagnosis not present

## 2018-12-05 DIAGNOSIS — S82241A Displaced spiral fracture of shaft of right tibia, initial encounter for closed fracture: Secondary | ICD-10-CM | POA: Diagnosis not present

## 2018-12-05 DIAGNOSIS — Z4682 Encounter for fitting and adjustment of non-vascular catheter: Secondary | ICD-10-CM | POA: Diagnosis not present

## 2018-12-05 DIAGNOSIS — W19XXXA Unspecified fall, initial encounter: Secondary | ICD-10-CM | POA: Diagnosis not present

## 2018-12-05 DIAGNOSIS — S82241D Displaced spiral fracture of shaft of right tibia, subsequent encounter for closed fracture with routine healing: Secondary | ICD-10-CM | POA: Diagnosis not present

## 2018-12-05 DIAGNOSIS — K5900 Constipation, unspecified: Secondary | ICD-10-CM | POA: Diagnosis not present

## 2018-12-05 DIAGNOSIS — E7800 Pure hypercholesterolemia, unspecified: Secondary | ICD-10-CM | POA: Diagnosis not present

## 2018-12-05 DIAGNOSIS — E876 Hypokalemia: Secondary | ICD-10-CM | POA: Diagnosis not present

## 2018-12-05 DIAGNOSIS — S299XXA Unspecified injury of thorax, initial encounter: Secondary | ICD-10-CM | POA: Diagnosis not present

## 2018-12-05 DIAGNOSIS — Z720 Tobacco use: Secondary | ICD-10-CM | POA: Diagnosis not present

## 2018-12-05 DIAGNOSIS — Z8673 Personal history of transient ischemic attack (TIA), and cerebral infarction without residual deficits: Secondary | ICD-10-CM | POA: Diagnosis not present

## 2018-12-05 DIAGNOSIS — S82831D Other fracture of upper and lower end of right fibula, subsequent encounter for closed fracture with routine healing: Secondary | ICD-10-CM | POA: Diagnosis not present

## 2018-12-05 DIAGNOSIS — R42 Dizziness and giddiness: Secondary | ICD-10-CM | POA: Diagnosis not present

## 2018-12-05 DIAGNOSIS — R262 Difficulty in walking, not elsewhere classified: Secondary | ICD-10-CM | POA: Diagnosis not present

## 2018-12-05 DIAGNOSIS — I959 Hypotension, unspecified: Secondary | ICD-10-CM | POA: Diagnosis not present

## 2018-12-05 DIAGNOSIS — M1990 Unspecified osteoarthritis, unspecified site: Secondary | ICD-10-CM | POA: Diagnosis not present

## 2018-12-05 DIAGNOSIS — S199XXA Unspecified injury of neck, initial encounter: Secondary | ICD-10-CM | POA: Diagnosis not present

## 2018-12-11 DIAGNOSIS — I10 Essential (primary) hypertension: Secondary | ICD-10-CM | POA: Diagnosis not present

## 2018-12-11 DIAGNOSIS — R42 Dizziness and giddiness: Secondary | ICD-10-CM | POA: Diagnosis not present

## 2018-12-11 DIAGNOSIS — R279 Unspecified lack of coordination: Secondary | ICD-10-CM | POA: Diagnosis not present

## 2018-12-11 DIAGNOSIS — S82831D Other fracture of upper and lower end of right fibula, subsequent encounter for closed fracture with routine healing: Secondary | ICD-10-CM | POA: Diagnosis not present

## 2018-12-11 DIAGNOSIS — J9601 Acute respiratory failure with hypoxia: Secondary | ICD-10-CM | POA: Diagnosis not present

## 2018-12-11 DIAGNOSIS — R262 Difficulty in walking, not elsewhere classified: Secondary | ICD-10-CM | POA: Diagnosis not present

## 2018-12-11 DIAGNOSIS — R269 Unspecified abnormalities of gait and mobility: Secondary | ICD-10-CM | POA: Diagnosis not present

## 2018-12-11 DIAGNOSIS — E876 Hypokalemia: Secondary | ICD-10-CM | POA: Diagnosis not present

## 2018-12-11 DIAGNOSIS — S82201D Unspecified fracture of shaft of right tibia, subsequent encounter for closed fracture with routine healing: Secondary | ICD-10-CM | POA: Diagnosis not present

## 2018-12-11 DIAGNOSIS — Z741 Need for assistance with personal care: Secondary | ICD-10-CM | POA: Diagnosis not present

## 2018-12-11 DIAGNOSIS — W19XXXA Unspecified fall, initial encounter: Secondary | ICD-10-CM | POA: Diagnosis not present

## 2018-12-11 DIAGNOSIS — J449 Chronic obstructive pulmonary disease, unspecified: Secondary | ICD-10-CM | POA: Diagnosis not present

## 2018-12-11 DIAGNOSIS — Z8673 Personal history of transient ischemic attack (TIA), and cerebral infarction without residual deficits: Secondary | ICD-10-CM | POA: Diagnosis not present

## 2018-12-11 DIAGNOSIS — E785 Hyperlipidemia, unspecified: Secondary | ICD-10-CM | POA: Diagnosis not present

## 2018-12-11 DIAGNOSIS — G9341 Metabolic encephalopathy: Secondary | ICD-10-CM | POA: Diagnosis not present

## 2018-12-11 DIAGNOSIS — Z79899 Other long term (current) drug therapy: Secondary | ICD-10-CM | POA: Diagnosis not present

## 2018-12-11 DIAGNOSIS — E559 Vitamin D deficiency, unspecified: Secondary | ICD-10-CM | POA: Diagnosis not present

## 2018-12-11 DIAGNOSIS — S82831A Other fracture of upper and lower end of right fibula, initial encounter for closed fracture: Secondary | ICD-10-CM | POA: Diagnosis not present

## 2018-12-11 DIAGNOSIS — J9691 Respiratory failure, unspecified with hypoxia: Secondary | ICD-10-CM | POA: Diagnosis not present

## 2018-12-11 DIAGNOSIS — K5900 Constipation, unspecified: Secondary | ICD-10-CM | POA: Diagnosis not present

## 2018-12-11 DIAGNOSIS — K219 Gastro-esophageal reflux disease without esophagitis: Secondary | ICD-10-CM | POA: Diagnosis not present

## 2018-12-11 DIAGNOSIS — E119 Type 2 diabetes mellitus without complications: Secondary | ICD-10-CM | POA: Diagnosis not present

## 2018-12-11 DIAGNOSIS — J45909 Unspecified asthma, uncomplicated: Secondary | ICD-10-CM | POA: Diagnosis not present

## 2018-12-11 DIAGNOSIS — I252 Old myocardial infarction: Secondary | ICD-10-CM | POA: Diagnosis not present

## 2018-12-11 DIAGNOSIS — J690 Pneumonitis due to inhalation of food and vomit: Secondary | ICD-10-CM | POA: Diagnosis not present

## 2018-12-11 DIAGNOSIS — F10929 Alcohol use, unspecified with intoxication, unspecified: Secondary | ICD-10-CM | POA: Diagnosis not present

## 2018-12-11 DIAGNOSIS — D649 Anemia, unspecified: Secondary | ICD-10-CM | POA: Diagnosis not present

## 2018-12-11 DIAGNOSIS — Z720 Tobacco use: Secondary | ICD-10-CM | POA: Diagnosis not present

## 2018-12-11 DIAGNOSIS — M6281 Muscle weakness (generalized): Secondary | ICD-10-CM | POA: Diagnosis not present

## 2018-12-11 DIAGNOSIS — S82241D Displaced spiral fracture of shaft of right tibia, subsequent encounter for closed fracture with routine healing: Secondary | ICD-10-CM | POA: Diagnosis not present

## 2018-12-11 DIAGNOSIS — E039 Hypothyroidism, unspecified: Secondary | ICD-10-CM | POA: Diagnosis not present

## 2018-12-13 DIAGNOSIS — J449 Chronic obstructive pulmonary disease, unspecified: Secondary | ICD-10-CM | POA: Diagnosis not present

## 2018-12-13 DIAGNOSIS — E785 Hyperlipidemia, unspecified: Secondary | ICD-10-CM | POA: Diagnosis not present

## 2018-12-13 DIAGNOSIS — J45909 Unspecified asthma, uncomplicated: Secondary | ICD-10-CM | POA: Diagnosis not present

## 2018-12-14 DIAGNOSIS — J690 Pneumonitis due to inhalation of food and vomit: Secondary | ICD-10-CM | POA: Diagnosis not present

## 2018-12-14 DIAGNOSIS — J9691 Respiratory failure, unspecified with hypoxia: Secondary | ICD-10-CM | POA: Diagnosis not present

## 2018-12-14 DIAGNOSIS — S82241D Displaced spiral fracture of shaft of right tibia, subsequent encounter for closed fracture with routine healing: Secondary | ICD-10-CM | POA: Diagnosis not present

## 2018-12-14 DIAGNOSIS — S82831D Other fracture of upper and lower end of right fibula, subsequent encounter for closed fracture with routine healing: Secondary | ICD-10-CM | POA: Diagnosis not present

## 2018-12-20 DIAGNOSIS — J690 Pneumonitis due to inhalation of food and vomit: Secondary | ICD-10-CM | POA: Diagnosis not present

## 2018-12-20 DIAGNOSIS — S82241D Displaced spiral fracture of shaft of right tibia, subsequent encounter for closed fracture with routine healing: Secondary | ICD-10-CM | POA: Diagnosis not present

## 2018-12-20 DIAGNOSIS — G9341 Metabolic encephalopathy: Secondary | ICD-10-CM | POA: Diagnosis not present

## 2018-12-20 DIAGNOSIS — S82201D Unspecified fracture of shaft of right tibia, subsequent encounter for closed fracture with routine healing: Secondary | ICD-10-CM | POA: Diagnosis not present

## 2018-12-20 DIAGNOSIS — S82831D Other fracture of upper and lower end of right fibula, subsequent encounter for closed fracture with routine healing: Secondary | ICD-10-CM | POA: Diagnosis not present

## 2018-12-28 DIAGNOSIS — Z741 Need for assistance with personal care: Secondary | ICD-10-CM | POA: Diagnosis not present

## 2018-12-28 DIAGNOSIS — R262 Difficulty in walking, not elsewhere classified: Secondary | ICD-10-CM | POA: Diagnosis not present

## 2018-12-28 DIAGNOSIS — R279 Unspecified lack of coordination: Secondary | ICD-10-CM | POA: Diagnosis not present

## 2018-12-28 DIAGNOSIS — S82831D Other fracture of upper and lower end of right fibula, subsequent encounter for closed fracture with routine healing: Secondary | ICD-10-CM | POA: Diagnosis not present

## 2018-12-28 DIAGNOSIS — M6281 Muscle weakness (generalized): Secondary | ICD-10-CM | POA: Diagnosis not present

## 2018-12-28 DIAGNOSIS — R269 Unspecified abnormalities of gait and mobility: Secondary | ICD-10-CM | POA: Diagnosis not present

## 2018-12-29 DIAGNOSIS — S82831D Other fracture of upper and lower end of right fibula, subsequent encounter for closed fracture with routine healing: Secondary | ICD-10-CM | POA: Diagnosis not present

## 2018-12-29 DIAGNOSIS — R262 Difficulty in walking, not elsewhere classified: Secondary | ICD-10-CM | POA: Diagnosis not present

## 2018-12-29 DIAGNOSIS — M6281 Muscle weakness (generalized): Secondary | ICD-10-CM | POA: Diagnosis not present

## 2018-12-29 DIAGNOSIS — R279 Unspecified lack of coordination: Secondary | ICD-10-CM | POA: Diagnosis not present

## 2018-12-29 DIAGNOSIS — Z741 Need for assistance with personal care: Secondary | ICD-10-CM | POA: Diagnosis not present

## 2018-12-29 DIAGNOSIS — R269 Unspecified abnormalities of gait and mobility: Secondary | ICD-10-CM | POA: Diagnosis not present

## 2018-12-30 DIAGNOSIS — E876 Hypokalemia: Secondary | ICD-10-CM | POA: Diagnosis not present

## 2018-12-30 DIAGNOSIS — D649 Anemia, unspecified: Secondary | ICD-10-CM | POA: Diagnosis not present

## 2018-12-30 DIAGNOSIS — S82241D Displaced spiral fracture of shaft of right tibia, subsequent encounter for closed fracture with routine healing: Secondary | ICD-10-CM | POA: Diagnosis not present

## 2018-12-30 DIAGNOSIS — S82831D Other fracture of upper and lower end of right fibula, subsequent encounter for closed fracture with routine healing: Secondary | ICD-10-CM | POA: Diagnosis not present

## 2018-12-31 DIAGNOSIS — S82831D Other fracture of upper and lower end of right fibula, subsequent encounter for closed fracture with routine healing: Secondary | ICD-10-CM | POA: Diagnosis not present

## 2018-12-31 DIAGNOSIS — Z741 Need for assistance with personal care: Secondary | ICD-10-CM | POA: Diagnosis not present

## 2018-12-31 DIAGNOSIS — R269 Unspecified abnormalities of gait and mobility: Secondary | ICD-10-CM | POA: Diagnosis not present

## 2018-12-31 DIAGNOSIS — M6281 Muscle weakness (generalized): Secondary | ICD-10-CM | POA: Diagnosis not present

## 2018-12-31 DIAGNOSIS — R279 Unspecified lack of coordination: Secondary | ICD-10-CM | POA: Diagnosis not present

## 2018-12-31 DIAGNOSIS — R262 Difficulty in walking, not elsewhere classified: Secondary | ICD-10-CM | POA: Diagnosis not present

## 2019-01-03 DIAGNOSIS — R262 Difficulty in walking, not elsewhere classified: Secondary | ICD-10-CM | POA: Diagnosis not present

## 2019-01-03 DIAGNOSIS — M6281 Muscle weakness (generalized): Secondary | ICD-10-CM | POA: Diagnosis not present

## 2019-01-03 DIAGNOSIS — Z741 Need for assistance with personal care: Secondary | ICD-10-CM | POA: Diagnosis not present

## 2019-01-03 DIAGNOSIS — R269 Unspecified abnormalities of gait and mobility: Secondary | ICD-10-CM | POA: Diagnosis not present

## 2019-01-03 DIAGNOSIS — R279 Unspecified lack of coordination: Secondary | ICD-10-CM | POA: Diagnosis not present

## 2019-01-03 DIAGNOSIS — S82831D Other fracture of upper and lower end of right fibula, subsequent encounter for closed fracture with routine healing: Secondary | ICD-10-CM | POA: Diagnosis not present

## 2019-01-05 DIAGNOSIS — R269 Unspecified abnormalities of gait and mobility: Secondary | ICD-10-CM | POA: Diagnosis not present

## 2019-01-05 DIAGNOSIS — M6281 Muscle weakness (generalized): Secondary | ICD-10-CM | POA: Diagnosis not present

## 2019-01-05 DIAGNOSIS — S82831D Other fracture of upper and lower end of right fibula, subsequent encounter for closed fracture with routine healing: Secondary | ICD-10-CM | POA: Diagnosis not present

## 2019-01-05 DIAGNOSIS — R262 Difficulty in walking, not elsewhere classified: Secondary | ICD-10-CM | POA: Diagnosis not present

## 2019-01-05 DIAGNOSIS — R279 Unspecified lack of coordination: Secondary | ICD-10-CM | POA: Diagnosis not present

## 2019-01-05 DIAGNOSIS — Z741 Need for assistance with personal care: Secondary | ICD-10-CM | POA: Diagnosis not present

## 2019-01-07 DIAGNOSIS — R279 Unspecified lack of coordination: Secondary | ICD-10-CM | POA: Diagnosis not present

## 2019-01-07 DIAGNOSIS — M6281 Muscle weakness (generalized): Secondary | ICD-10-CM | POA: Diagnosis not present

## 2019-01-07 DIAGNOSIS — S82831D Other fracture of upper and lower end of right fibula, subsequent encounter for closed fracture with routine healing: Secondary | ICD-10-CM | POA: Diagnosis not present

## 2019-01-07 DIAGNOSIS — R269 Unspecified abnormalities of gait and mobility: Secondary | ICD-10-CM | POA: Diagnosis not present

## 2019-01-07 DIAGNOSIS — R262 Difficulty in walking, not elsewhere classified: Secondary | ICD-10-CM | POA: Diagnosis not present

## 2019-01-07 DIAGNOSIS — Z741 Need for assistance with personal care: Secondary | ICD-10-CM | POA: Diagnosis not present

## 2019-01-10 DIAGNOSIS — R262 Difficulty in walking, not elsewhere classified: Secondary | ICD-10-CM | POA: Diagnosis not present

## 2019-01-10 DIAGNOSIS — M79604 Pain in right leg: Secondary | ICD-10-CM | POA: Diagnosis not present

## 2019-01-10 DIAGNOSIS — R279 Unspecified lack of coordination: Secondary | ICD-10-CM | POA: Diagnosis not present

## 2019-01-10 DIAGNOSIS — S82831D Other fracture of upper and lower end of right fibula, subsequent encounter for closed fracture with routine healing: Secondary | ICD-10-CM | POA: Diagnosis not present

## 2019-01-10 DIAGNOSIS — E876 Hypokalemia: Secondary | ICD-10-CM | POA: Diagnosis not present

## 2019-01-10 DIAGNOSIS — D649 Anemia, unspecified: Secondary | ICD-10-CM | POA: Diagnosis not present

## 2019-01-10 DIAGNOSIS — K5900 Constipation, unspecified: Secondary | ICD-10-CM | POA: Diagnosis not present

## 2019-01-10 DIAGNOSIS — R269 Unspecified abnormalities of gait and mobility: Secondary | ICD-10-CM | POA: Diagnosis not present

## 2019-01-10 DIAGNOSIS — Z741 Need for assistance with personal care: Secondary | ICD-10-CM | POA: Diagnosis not present

## 2019-01-10 DIAGNOSIS — M6281 Muscle weakness (generalized): Secondary | ICD-10-CM | POA: Diagnosis not present

## 2019-01-11 DIAGNOSIS — D649 Anemia, unspecified: Secondary | ICD-10-CM | POA: Diagnosis not present

## 2019-01-11 DIAGNOSIS — Z79899 Other long term (current) drug therapy: Secondary | ICD-10-CM | POA: Diagnosis not present

## 2019-01-11 DIAGNOSIS — I10 Essential (primary) hypertension: Secondary | ICD-10-CM | POA: Diagnosis not present

## 2019-01-12 DIAGNOSIS — M6281 Muscle weakness (generalized): Secondary | ICD-10-CM | POA: Diagnosis not present

## 2019-01-12 DIAGNOSIS — R269 Unspecified abnormalities of gait and mobility: Secondary | ICD-10-CM | POA: Diagnosis not present

## 2019-01-12 DIAGNOSIS — S82831D Other fracture of upper and lower end of right fibula, subsequent encounter for closed fracture with routine healing: Secondary | ICD-10-CM | POA: Diagnosis not present

## 2019-01-12 DIAGNOSIS — R262 Difficulty in walking, not elsewhere classified: Secondary | ICD-10-CM | POA: Diagnosis not present

## 2019-01-12 DIAGNOSIS — Z741 Need for assistance with personal care: Secondary | ICD-10-CM | POA: Diagnosis not present

## 2019-01-12 DIAGNOSIS — R279 Unspecified lack of coordination: Secondary | ICD-10-CM | POA: Diagnosis not present

## 2019-01-14 DIAGNOSIS — R269 Unspecified abnormalities of gait and mobility: Secondary | ICD-10-CM | POA: Diagnosis not present

## 2019-01-14 DIAGNOSIS — S82831D Other fracture of upper and lower end of right fibula, subsequent encounter for closed fracture with routine healing: Secondary | ICD-10-CM | POA: Diagnosis not present

## 2019-01-14 DIAGNOSIS — M6281 Muscle weakness (generalized): Secondary | ICD-10-CM | POA: Diagnosis not present

## 2019-01-14 DIAGNOSIS — R262 Difficulty in walking, not elsewhere classified: Secondary | ICD-10-CM | POA: Diagnosis not present

## 2019-01-14 DIAGNOSIS — Z741 Need for assistance with personal care: Secondary | ICD-10-CM | POA: Diagnosis not present

## 2019-01-14 DIAGNOSIS — R279 Unspecified lack of coordination: Secondary | ICD-10-CM | POA: Diagnosis not present

## 2019-01-17 DIAGNOSIS — S82831D Other fracture of upper and lower end of right fibula, subsequent encounter for closed fracture with routine healing: Secondary | ICD-10-CM | POA: Diagnosis not present

## 2019-01-17 DIAGNOSIS — M6281 Muscle weakness (generalized): Secondary | ICD-10-CM | POA: Diagnosis not present

## 2019-01-17 DIAGNOSIS — R269 Unspecified abnormalities of gait and mobility: Secondary | ICD-10-CM | POA: Diagnosis not present

## 2019-01-17 DIAGNOSIS — R279 Unspecified lack of coordination: Secondary | ICD-10-CM | POA: Diagnosis not present

## 2019-01-17 DIAGNOSIS — S82201D Unspecified fracture of shaft of right tibia, subsequent encounter for closed fracture with routine healing: Secondary | ICD-10-CM | POA: Diagnosis not present

## 2019-01-17 DIAGNOSIS — R262 Difficulty in walking, not elsewhere classified: Secondary | ICD-10-CM | POA: Diagnosis not present

## 2019-01-17 DIAGNOSIS — Z741 Need for assistance with personal care: Secondary | ICD-10-CM | POA: Diagnosis not present

## 2019-01-19 DIAGNOSIS — M6281 Muscle weakness (generalized): Secondary | ICD-10-CM | POA: Diagnosis not present

## 2019-01-19 DIAGNOSIS — R269 Unspecified abnormalities of gait and mobility: Secondary | ICD-10-CM | POA: Diagnosis not present

## 2019-01-19 DIAGNOSIS — Z741 Need for assistance with personal care: Secondary | ICD-10-CM | POA: Diagnosis not present

## 2019-01-19 DIAGNOSIS — R262 Difficulty in walking, not elsewhere classified: Secondary | ICD-10-CM | POA: Diagnosis not present

## 2019-01-19 DIAGNOSIS — S82831D Other fracture of upper and lower end of right fibula, subsequent encounter for closed fracture with routine healing: Secondary | ICD-10-CM | POA: Diagnosis not present

## 2019-01-19 DIAGNOSIS — R279 Unspecified lack of coordination: Secondary | ICD-10-CM | POA: Diagnosis not present

## 2019-01-20 DIAGNOSIS — R262 Difficulty in walking, not elsewhere classified: Secondary | ICD-10-CM | POA: Diagnosis not present

## 2019-01-20 DIAGNOSIS — M6281 Muscle weakness (generalized): Secondary | ICD-10-CM | POA: Diagnosis not present

## 2019-01-20 DIAGNOSIS — R279 Unspecified lack of coordination: Secondary | ICD-10-CM | POA: Diagnosis not present

## 2019-01-20 DIAGNOSIS — Z741 Need for assistance with personal care: Secondary | ICD-10-CM | POA: Diagnosis not present

## 2019-01-20 DIAGNOSIS — S82241D Displaced spiral fracture of shaft of right tibia, subsequent encounter for closed fracture with routine healing: Secondary | ICD-10-CM | POA: Diagnosis not present

## 2019-01-20 DIAGNOSIS — R269 Unspecified abnormalities of gait and mobility: Secondary | ICD-10-CM | POA: Diagnosis not present

## 2019-01-20 DIAGNOSIS — S82831D Other fracture of upper and lower end of right fibula, subsequent encounter for closed fracture with routine healing: Secondary | ICD-10-CM | POA: Diagnosis not present

## 2019-01-20 DIAGNOSIS — D649 Anemia, unspecified: Secondary | ICD-10-CM | POA: Diagnosis not present

## 2019-01-20 DIAGNOSIS — R05 Cough: Secondary | ICD-10-CM | POA: Diagnosis not present

## 2019-01-24 DIAGNOSIS — R269 Unspecified abnormalities of gait and mobility: Secondary | ICD-10-CM | POA: Diagnosis not present

## 2019-01-24 DIAGNOSIS — G2581 Restless legs syndrome: Secondary | ICD-10-CM | POA: Diagnosis not present

## 2019-01-24 DIAGNOSIS — S82831D Other fracture of upper and lower end of right fibula, subsequent encounter for closed fracture with routine healing: Secondary | ICD-10-CM | POA: Diagnosis not present

## 2019-01-24 DIAGNOSIS — J301 Allergic rhinitis due to pollen: Secondary | ICD-10-CM | POA: Diagnosis not present

## 2019-01-24 DIAGNOSIS — J4530 Mild persistent asthma, uncomplicated: Secondary | ICD-10-CM | POA: Diagnosis not present

## 2019-01-24 DIAGNOSIS — R279 Unspecified lack of coordination: Secondary | ICD-10-CM | POA: Diagnosis not present

## 2019-01-24 DIAGNOSIS — M6281 Muscle weakness (generalized): Secondary | ICD-10-CM | POA: Diagnosis not present

## 2019-01-24 DIAGNOSIS — R262 Difficulty in walking, not elsewhere classified: Secondary | ICD-10-CM | POA: Diagnosis not present

## 2019-01-24 DIAGNOSIS — Z741 Need for assistance with personal care: Secondary | ICD-10-CM | POA: Diagnosis not present

## 2019-01-26 DIAGNOSIS — M79604 Pain in right leg: Secondary | ICD-10-CM | POA: Diagnosis not present

## 2019-01-26 DIAGNOSIS — R52 Pain, unspecified: Secondary | ICD-10-CM | POA: Diagnosis not present

## 2019-01-26 DIAGNOSIS — M7989 Other specified soft tissue disorders: Secondary | ICD-10-CM | POA: Diagnosis not present

## 2019-01-26 DIAGNOSIS — J9809 Other diseases of bronchus, not elsewhere classified: Secondary | ICD-10-CM | POA: Diagnosis not present

## 2019-01-26 DIAGNOSIS — J984 Other disorders of lung: Secondary | ICD-10-CM | POA: Diagnosis not present

## 2019-01-26 DIAGNOSIS — R0902 Hypoxemia: Secondary | ICD-10-CM | POA: Diagnosis not present

## 2019-01-26 DIAGNOSIS — M79661 Pain in right lower leg: Secondary | ICD-10-CM | POA: Diagnosis not present

## 2019-01-27 DIAGNOSIS — W19XXXA Unspecified fall, initial encounter: Secondary | ICD-10-CM | POA: Diagnosis not present

## 2019-01-27 DIAGNOSIS — R0902 Hypoxemia: Secondary | ICD-10-CM | POA: Diagnosis not present

## 2019-01-27 DIAGNOSIS — J9809 Other diseases of bronchus, not elsewhere classified: Secondary | ICD-10-CM | POA: Diagnosis not present

## 2019-01-27 DIAGNOSIS — R52 Pain, unspecified: Secondary | ICD-10-CM | POA: Diagnosis not present

## 2019-01-28 DIAGNOSIS — E7800 Pure hypercholesterolemia, unspecified: Secondary | ICD-10-CM | POA: Diagnosis not present

## 2019-01-28 DIAGNOSIS — R400 Somnolence: Secondary | ICD-10-CM | POA: Diagnosis not present

## 2019-01-28 DIAGNOSIS — M79604 Pain in right leg: Secondary | ICD-10-CM | POA: Diagnosis not present

## 2019-01-28 DIAGNOSIS — I252 Old myocardial infarction: Secondary | ICD-10-CM | POA: Diagnosis not present

## 2019-01-28 DIAGNOSIS — I10 Essential (primary) hypertension: Secondary | ICD-10-CM | POA: Diagnosis not present

## 2019-01-28 DIAGNOSIS — Z590 Homelessness: Secondary | ICD-10-CM | POA: Diagnosis not present

## 2019-01-28 DIAGNOSIS — Z79899 Other long term (current) drug therapy: Secondary | ICD-10-CM | POA: Diagnosis not present

## 2019-01-29 DIAGNOSIS — Z590 Homelessness: Secondary | ICD-10-CM | POA: Diagnosis not present

## 2019-01-29 DIAGNOSIS — I252 Old myocardial infarction: Secondary | ICD-10-CM | POA: Diagnosis not present

## 2019-01-29 DIAGNOSIS — M79604 Pain in right leg: Secondary | ICD-10-CM | POA: Diagnosis not present

## 2019-01-29 DIAGNOSIS — Z79899 Other long term (current) drug therapy: Secondary | ICD-10-CM | POA: Diagnosis not present

## 2019-01-29 DIAGNOSIS — E162 Hypoglycemia, unspecified: Secondary | ICD-10-CM | POA: Diagnosis not present

## 2019-01-29 DIAGNOSIS — E161 Other hypoglycemia: Secondary | ICD-10-CM | POA: Diagnosis not present

## 2019-01-29 DIAGNOSIS — I10 Essential (primary) hypertension: Secondary | ICD-10-CM | POA: Diagnosis not present

## 2019-01-29 DIAGNOSIS — R52 Pain, unspecified: Secondary | ICD-10-CM | POA: Diagnosis not present

## 2019-01-29 DIAGNOSIS — E7800 Pure hypercholesterolemia, unspecified: Secondary | ICD-10-CM | POA: Diagnosis not present

## 2019-01-30 DIAGNOSIS — Z590 Homelessness: Secondary | ICD-10-CM | POA: Diagnosis not present

## 2019-01-30 DIAGNOSIS — I252 Old myocardial infarction: Secondary | ICD-10-CM | POA: Diagnosis not present

## 2019-01-30 DIAGNOSIS — Z79899 Other long term (current) drug therapy: Secondary | ICD-10-CM | POA: Diagnosis not present

## 2019-01-30 DIAGNOSIS — E7800 Pure hypercholesterolemia, unspecified: Secondary | ICD-10-CM | POA: Diagnosis not present

## 2019-01-30 DIAGNOSIS — I10 Essential (primary) hypertension: Secondary | ICD-10-CM | POA: Diagnosis not present

## 2019-02-02 DIAGNOSIS — E7800 Pure hypercholesterolemia, unspecified: Secondary | ICD-10-CM | POA: Diagnosis not present

## 2019-02-02 DIAGNOSIS — Z79899 Other long term (current) drug therapy: Secondary | ICD-10-CM | POA: Diagnosis not present

## 2019-02-02 DIAGNOSIS — I10 Essential (primary) hypertension: Secondary | ICD-10-CM | POA: Diagnosis not present

## 2019-02-02 DIAGNOSIS — Z590 Homelessness: Secondary | ICD-10-CM | POA: Diagnosis not present

## 2019-02-02 DIAGNOSIS — I252 Old myocardial infarction: Secondary | ICD-10-CM | POA: Diagnosis not present

## 2019-02-03 DIAGNOSIS — I252 Old myocardial infarction: Secondary | ICD-10-CM | POA: Diagnosis not present

## 2019-02-03 DIAGNOSIS — Z590 Homelessness: Secondary | ICD-10-CM | POA: Diagnosis not present

## 2019-02-03 DIAGNOSIS — E7800 Pure hypercholesterolemia, unspecified: Secondary | ICD-10-CM | POA: Diagnosis not present

## 2019-02-03 DIAGNOSIS — I10 Essential (primary) hypertension: Secondary | ICD-10-CM | POA: Diagnosis not present

## 2019-02-03 DIAGNOSIS — Z79899 Other long term (current) drug therapy: Secondary | ICD-10-CM | POA: Diagnosis not present

## 2019-02-04 DIAGNOSIS — E7800 Pure hypercholesterolemia, unspecified: Secondary | ICD-10-CM | POA: Diagnosis not present

## 2019-02-04 DIAGNOSIS — I10 Essential (primary) hypertension: Secondary | ICD-10-CM | POA: Diagnosis not present

## 2019-02-04 DIAGNOSIS — Z79899 Other long term (current) drug therapy: Secondary | ICD-10-CM | POA: Diagnosis not present

## 2019-02-04 DIAGNOSIS — I252 Old myocardial infarction: Secondary | ICD-10-CM | POA: Diagnosis not present

## 2019-02-04 DIAGNOSIS — Z590 Homelessness: Secondary | ICD-10-CM | POA: Diagnosis not present

## 2019-02-05 DIAGNOSIS — Z590 Homelessness: Secondary | ICD-10-CM | POA: Diagnosis not present

## 2019-02-05 DIAGNOSIS — Z79899 Other long term (current) drug therapy: Secondary | ICD-10-CM | POA: Diagnosis not present

## 2019-02-05 DIAGNOSIS — I252 Old myocardial infarction: Secondary | ICD-10-CM | POA: Diagnosis not present

## 2019-02-05 DIAGNOSIS — I10 Essential (primary) hypertension: Secondary | ICD-10-CM | POA: Diagnosis not present

## 2019-02-05 DIAGNOSIS — E7800 Pure hypercholesterolemia, unspecified: Secondary | ICD-10-CM | POA: Diagnosis not present

## 2019-02-06 DIAGNOSIS — I10 Essential (primary) hypertension: Secondary | ICD-10-CM | POA: Diagnosis not present

## 2019-02-06 DIAGNOSIS — Z590 Homelessness: Secondary | ICD-10-CM | POA: Diagnosis not present

## 2019-02-06 DIAGNOSIS — Z79899 Other long term (current) drug therapy: Secondary | ICD-10-CM | POA: Diagnosis not present

## 2019-02-06 DIAGNOSIS — I252 Old myocardial infarction: Secondary | ICD-10-CM | POA: Diagnosis not present

## 2019-02-06 DIAGNOSIS — E7800 Pure hypercholesterolemia, unspecified: Secondary | ICD-10-CM | POA: Diagnosis not present

## 2019-02-07 DIAGNOSIS — E7800 Pure hypercholesterolemia, unspecified: Secondary | ICD-10-CM | POA: Diagnosis not present

## 2019-02-07 DIAGNOSIS — I252 Old myocardial infarction: Secondary | ICD-10-CM | POA: Diagnosis not present

## 2019-02-07 DIAGNOSIS — Z590 Homelessness: Secondary | ICD-10-CM | POA: Diagnosis not present

## 2019-02-07 DIAGNOSIS — I10 Essential (primary) hypertension: Secondary | ICD-10-CM | POA: Diagnosis not present

## 2019-02-07 DIAGNOSIS — Z79899 Other long term (current) drug therapy: Secondary | ICD-10-CM | POA: Diagnosis not present

## 2019-02-08 DIAGNOSIS — Z765 Malingerer [conscious simulation]: Secondary | ICD-10-CM | POA: Diagnosis not present

## 2019-02-10 ENCOUNTER — Other Ambulatory Visit: Payer: Self-pay

## 2019-02-10 ENCOUNTER — Inpatient Hospital Stay (HOSPITAL_COMMUNITY)
Admission: EM | Admit: 2019-02-10 | Discharge: 2019-02-14 | DRG: 897 | Disposition: A | Discharge status: Other Acute Inpatient Hospital | Payer: Medicare Other | Attending: Internal Medicine | Admitting: Internal Medicine

## 2019-02-10 ENCOUNTER — Encounter (HOSPITAL_COMMUNITY): Payer: Self-pay | Admitting: Emergency Medicine

## 2019-02-10 DIAGNOSIS — F332 Major depressive disorder, recurrent severe without psychotic features: Secondary | ICD-10-CM | POA: Diagnosis present

## 2019-02-10 DIAGNOSIS — F10930 Alcohol use, unspecified with withdrawal, uncomplicated: Secondary | ICD-10-CM

## 2019-02-10 DIAGNOSIS — R402252 Coma scale, best verbal response, oriented, at arrival to emergency department: Secondary | ICD-10-CM | POA: Diagnosis present

## 2019-02-10 DIAGNOSIS — I700 Atherosclerosis of aorta: Secondary | ICD-10-CM | POA: Diagnosis not present

## 2019-02-10 DIAGNOSIS — I10 Essential (primary) hypertension: Secondary | ICD-10-CM | POA: Diagnosis not present

## 2019-02-10 DIAGNOSIS — F419 Anxiety disorder, unspecified: Secondary | ICD-10-CM | POA: Diagnosis present

## 2019-02-10 DIAGNOSIS — F10230 Alcohol dependence with withdrawal, uncomplicated: Principal | ICD-10-CM | POA: Diagnosis present

## 2019-02-10 DIAGNOSIS — E8729 Other acidosis: Secondary | ICD-10-CM | POA: Diagnosis present

## 2019-02-10 DIAGNOSIS — S82201D Unspecified fracture of shaft of right tibia, subsequent encounter for closed fracture with routine healing: Secondary | ICD-10-CM

## 2019-02-10 DIAGNOSIS — Z9114 Patient's other noncompliance with medication regimen: Secondary | ICD-10-CM | POA: Diagnosis not present

## 2019-02-10 DIAGNOSIS — Z915 Personal history of self-harm: Secondary | ICD-10-CM

## 2019-02-10 DIAGNOSIS — J449 Chronic obstructive pulmonary disease, unspecified: Secondary | ICD-10-CM | POA: Diagnosis not present

## 2019-02-10 DIAGNOSIS — F10239 Alcohol dependence with withdrawal, unspecified: Secondary | ICD-10-CM | POA: Diagnosis not present

## 2019-02-10 DIAGNOSIS — Z590 Homelessness: Secondary | ICD-10-CM

## 2019-02-10 DIAGNOSIS — R011 Cardiac murmur, unspecified: Secondary | ICD-10-CM | POA: Diagnosis present

## 2019-02-10 DIAGNOSIS — W19XXXD Unspecified fall, subsequent encounter: Secondary | ICD-10-CM | POA: Diagnosis present

## 2019-02-10 DIAGNOSIS — R45851 Suicidal ideations: Secondary | ICD-10-CM | POA: Diagnosis present

## 2019-02-10 DIAGNOSIS — E162 Hypoglycemia, unspecified: Secondary | ICD-10-CM | POA: Diagnosis not present

## 2019-02-10 DIAGNOSIS — K5900 Constipation, unspecified: Secondary | ICD-10-CM | POA: Diagnosis present

## 2019-02-10 DIAGNOSIS — R825 Elevated urine levels of drugs, medicaments and biological substances: Secondary | ICD-10-CM | POA: Diagnosis not present

## 2019-02-10 DIAGNOSIS — M79661 Pain in right lower leg: Secondary | ICD-10-CM | POA: Diagnosis not present

## 2019-02-10 DIAGNOSIS — F1024 Alcohol dependence with alcohol-induced mood disorder: Secondary | ICD-10-CM | POA: Diagnosis not present

## 2019-02-10 DIAGNOSIS — Y901 Blood alcohol level of 20-39 mg/100 ml: Secondary | ICD-10-CM | POA: Diagnosis present

## 2019-02-10 DIAGNOSIS — F1994 Other psychoactive substance use, unspecified with psychoactive substance-induced mood disorder: Secondary | ICD-10-CM | POA: Diagnosis not present

## 2019-02-10 DIAGNOSIS — Z8781 Personal history of (healed) traumatic fracture: Secondary | ICD-10-CM | POA: Diagnosis not present

## 2019-02-10 DIAGNOSIS — R42 Dizziness and giddiness: Secondary | ICD-10-CM | POA: Diagnosis not present

## 2019-02-10 DIAGNOSIS — R402362 Coma scale, best motor response, obeys commands, at arrival to emergency department: Secondary | ICD-10-CM | POA: Diagnosis not present

## 2019-02-10 DIAGNOSIS — E161 Other hypoglycemia: Secondary | ICD-10-CM | POA: Diagnosis not present

## 2019-02-10 DIAGNOSIS — F410 Panic disorder [episodic paroxysmal anxiety] without agoraphobia: Secondary | ICD-10-CM | POA: Diagnosis present

## 2019-02-10 DIAGNOSIS — Z818 Family history of other mental and behavioral disorders: Secondary | ICD-10-CM

## 2019-02-10 DIAGNOSIS — R402142 Coma scale, eyes open, spontaneous, at arrival to emergency department: Secondary | ICD-10-CM | POA: Diagnosis not present

## 2019-02-10 DIAGNOSIS — G4700 Insomnia, unspecified: Secondary | ICD-10-CM | POA: Diagnosis present

## 2019-02-10 DIAGNOSIS — R51 Headache: Secondary | ICD-10-CM | POA: Diagnosis not present

## 2019-02-10 DIAGNOSIS — T1490XA Injury, unspecified, initial encounter: Secondary | ICD-10-CM | POA: Diagnosis not present

## 2019-02-10 DIAGNOSIS — F1014 Alcohol abuse with alcohol-induced mood disorder: Secondary | ICD-10-CM | POA: Diagnosis not present

## 2019-02-10 DIAGNOSIS — Z79899 Other long term (current) drug therapy: Secondary | ICD-10-CM | POA: Diagnosis not present

## 2019-02-10 DIAGNOSIS — F10939 Alcohol use, unspecified with withdrawal, unspecified: Secondary | ICD-10-CM | POA: Diagnosis present

## 2019-02-10 DIAGNOSIS — F339 Major depressive disorder, recurrent, unspecified: Secondary | ICD-10-CM | POA: Diagnosis present

## 2019-02-10 DIAGNOSIS — G8918 Other acute postprocedural pain: Secondary | ICD-10-CM | POA: Diagnosis not present

## 2019-02-10 DIAGNOSIS — E872 Acidosis: Secondary | ICD-10-CM | POA: Diagnosis present

## 2019-02-10 DIAGNOSIS — S82401D Unspecified fracture of shaft of right fibula, subsequent encounter for closed fracture with routine healing: Secondary | ICD-10-CM | POA: Diagnosis not present

## 2019-02-10 DIAGNOSIS — F17210 Nicotine dependence, cigarettes, uncomplicated: Secondary | ICD-10-CM | POA: Diagnosis present

## 2019-02-10 DIAGNOSIS — F10288 Alcohol dependence with other alcohol-induced disorder: Secondary | ICD-10-CM | POA: Diagnosis present

## 2019-02-10 DIAGNOSIS — E785 Hyperlipidemia, unspecified: Secondary | ICD-10-CM | POA: Diagnosis not present

## 2019-02-10 DIAGNOSIS — R1084 Generalized abdominal pain: Secondary | ICD-10-CM | POA: Diagnosis not present

## 2019-02-10 DIAGNOSIS — F1020 Alcohol dependence, uncomplicated: Secondary | ICD-10-CM | POA: Diagnosis present

## 2019-02-10 DIAGNOSIS — G4089 Other seizures: Secondary | ICD-10-CM | POA: Diagnosis present

## 2019-02-10 HISTORY — DX: Anxiety disorder, unspecified: F41.9

## 2019-02-10 LAB — RAPID URINE DRUG SCREEN, HOSP PERFORMED
Amphetamines: NOT DETECTED
BARBITURATES: NOT DETECTED
Benzodiazepines: POSITIVE — AB
COCAINE: NOT DETECTED
OPIATES: NOT DETECTED
TETRAHYDROCANNABINOL: NOT DETECTED

## 2019-02-10 LAB — COMPREHENSIVE METABOLIC PANEL
ALK PHOS: 115 U/L (ref 38–126)
ALT: 28 U/L (ref 0–44)
ANION GAP: 23 — AB (ref 5–15)
AST: 31 U/L (ref 15–41)
Albumin: 4.2 g/dL (ref 3.5–5.0)
BUN: 11 mg/dL (ref 6–20)
CALCIUM: 9.7 mg/dL (ref 8.9–10.3)
CO2: 15 mmol/L — ABNORMAL LOW (ref 22–32)
CREATININE: 0.9 mg/dL (ref 0.61–1.24)
Chloride: 95 mmol/L — ABNORMAL LOW (ref 98–111)
GFR calc non Af Amer: >60 mL/min (ref >60)
GLUCOSE: 81 mg/dL (ref 70–99)
Potassium: 3.9 mmol/L (ref 3.5–5.1)
Sodium: 133 mmol/L — ABNORMAL LOW (ref 135–145)
Total Bilirubin: 1 mg/dL (ref 0.3–1.2)
Total Protein: 7.5 g/dL (ref 6.5–8.1)

## 2019-02-10 LAB — URINALYSIS, ROUTINE W REFLEX MICROSCOPIC
Bilirubin Urine: NEGATIVE
Glucose, UA: NEGATIVE mg/dL
Hgb urine dipstick: NEGATIVE
Ketones, ur: 80 mg/dL — AB
Leukocytes,Ua: NEGATIVE
Nitrite: NEGATIVE
Protein, ur: NEGATIVE mg/dL
Specific Gravity, Urine: 1.015 (ref 1.005–1.030)
pH: 5 (ref 5.0–8.0)

## 2019-02-10 LAB — CBC WITH DIFFERENTIAL/PLATELET
ABS IMMATURE GRANULOCYTES: 0.02 10*3/uL (ref 0.00–0.07)
BASOS ABS: 0.1 10*3/uL (ref 0.0–0.1)
BASOS PCT: 1 %
EOS ABS: 0.1 10*3/uL (ref 0.0–0.5)
Eosinophils Relative: 0 %
HCT: 45.5 % (ref 39.0–52.0)
Hemoglobin: 14.9 g/dL (ref 13.0–17.0)
IMMATURE GRANULOCYTES: 0 %
Lymphocytes Relative: 30 %
Lymphs Abs: 3.7 10*3/uL (ref 0.7–4.0)
MCH: 29.4 pg (ref 26.0–34.0)
MCHC: 32.7 g/dL (ref 30.0–36.0)
MCV: 89.9 fL (ref 80.0–100.0)
MONOS PCT: 12 %
Monocytes Absolute: 1.5 10*3/uL — ABNORMAL HIGH (ref 0.1–1.0)
NEUTROS ABS: 6.9 10*3/uL (ref 1.7–7.7)
NEUTROS PCT: 57 %
NRBC: 0 % (ref 0.0–0.2)
PLATELETS: 329 10*3/uL (ref 150–400)
RBC: 5.06 MIL/uL (ref 4.22–5.81)
RDW: 12.5 % (ref 11.5–15.5)
WBC: 12.2 10*3/uL — ABNORMAL HIGH (ref 4.0–10.5)

## 2019-02-10 LAB — LACTIC ACID, PLASMA: Lactic Acid, Venous: 0.8 mmol/L (ref 0.5–1.9)

## 2019-02-10 LAB — OSMOLALITY: Osmolality: 280 mOsm/kg (ref 275–295)

## 2019-02-10 LAB — CBC
HCT: 39.6 % (ref 39.0–52.0)
Hemoglobin: 13.1 g/dL (ref 13.0–17.0)
MCH: 29.6 pg (ref 26.0–34.0)
MCHC: 33.1 g/dL (ref 30.0–36.0)
MCV: 89.4 fL (ref 80.0–100.0)
Platelets: 274 10*3/uL (ref 150–400)
RBC: 4.43 MIL/uL (ref 4.22–5.81)
RDW: 12.6 % (ref 11.5–15.5)
WBC: 9.7 10*3/uL (ref 4.0–10.5)
nRBC: 0 % (ref 0.0–0.2)

## 2019-02-10 LAB — SALICYLATE LEVEL

## 2019-02-10 LAB — PHOSPHORUS: PHOSPHORUS: 2.2 mg/dL — AB (ref 2.5–4.6)

## 2019-02-10 LAB — ETHANOL: Alcohol, Ethyl (B): 21 mg/dL — ABNORMAL HIGH (ref <10)

## 2019-02-10 LAB — MAGNESIUM: Magnesium: 2.2 mg/dL (ref 1.7–2.4)

## 2019-02-10 LAB — ACETAMINOPHEN LEVEL

## 2019-02-10 MED ORDER — DEXTROSE-NACL 5-0.9 % IV SOLN
INTRAVENOUS | Status: DC
  Administered 2019-02-10 – 2019-02-11 (×2): via INTRAVENOUS

## 2019-02-10 MED ORDER — FOLIC ACID 1 MG PO TABS
1.0000 mg | ORAL_TABLET | Freq: Every day | ORAL | Status: DC
  Administered 2019-02-11 – 2019-02-14 (×4): 1 mg via ORAL
  Filled 2019-02-10 (×4): qty 1

## 2019-02-10 MED ORDER — LORAZEPAM 1 MG PO TABS: 1.0000 mg | ORAL_TABLET | Freq: Four times a day (QID) | ORAL | Status: DC | PRN

## 2019-02-10 MED ORDER — DEXTROSE-NACL 5-0.9 % IV SOLN: INTRAVENOUS | Status: DC

## 2019-02-10 MED ORDER — VITAMIN B-1 100 MG PO TABS
100.0000 mg | ORAL_TABLET | Freq: Every day | ORAL | Status: DC
  Administered 2019-02-11 – 2019-02-14 (×4): 100 mg via ORAL
  Filled 2019-02-10 (×4): qty 1

## 2019-02-10 MED ORDER — THIAMINE HCL 100 MG/ML IJ SOLN
100.0000 mg | Freq: Every day | INTRAMUSCULAR | Status: DC
  Filled 2019-02-10: qty 2

## 2019-02-10 MED ORDER — ALPRAZOLAM 0.5 MG PO TABS
0.5000 mg | ORAL_TABLET | Freq: Four times a day (QID) | ORAL | Status: DC
  Administered 2019-02-10 – 2019-02-14 (×15): 0.5 mg via ORAL
  Filled 2019-02-10 (×15): qty 1

## 2019-02-10 MED ORDER — LORAZEPAM 2 MG/ML IJ SOLN
1.0000 mg | Freq: Four times a day (QID) | INTRAMUSCULAR | Status: DC | PRN
  Administered 2019-02-11 – 2019-02-12 (×2): 1 mg via INTRAVENOUS
  Filled 2019-02-10 (×2): qty 1

## 2019-02-10 MED ORDER — POTASSIUM PHOSPHATE MONOBASIC 500 MG PO TABS
500.0000 mg | ORAL_TABLET | Freq: Two times a day (BID) | ORAL | Status: DC
  Filled 2019-02-10 (×2): qty 1

## 2019-02-10 MED ORDER — HEPARIN SODIUM (PORCINE) 5000 UNIT/ML IJ SOLN
5000.0000 [IU] | Freq: Three times a day (TID) | INTRAMUSCULAR | Status: DC
  Administered 2019-02-10 – 2019-02-12 (×5): 5000 [IU] via SUBCUTANEOUS
  Filled 2019-02-10 (×5): qty 1

## 2019-02-10 MED ORDER — ALBUTEROL SULFATE (2.5 MG/3ML) 0.083% IN NEBU: 2.5000 mg | INHALATION_SOLUTION | Freq: Four times a day (QID) | RESPIRATORY_TRACT | Status: DC | PRN

## 2019-02-10 MED ORDER — SENNOSIDES-DOCUSATE SODIUM 8.6-50 MG PO TABS: 1.0000 | ORAL_TABLET | Freq: Every evening | ORAL | Status: DC | PRN

## 2019-02-10 MED ORDER — GABAPENTIN 300 MG PO CAPS
300.0000 mg | ORAL_CAPSULE | Freq: Three times a day (TID) | ORAL | Status: DC
  Administered 2019-02-10 – 2019-02-14 (×11): 300 mg via ORAL
  Filled 2019-02-10 (×11): qty 1

## 2019-02-10 MED ORDER — ADULT MULTIVITAMIN W/MINERALS CH
1.0000 | ORAL_TABLET | Freq: Every day | ORAL | Status: DC
  Administered 2019-02-11 – 2019-02-14 (×4): 1 via ORAL
  Filled 2019-02-10 (×4): qty 1

## 2019-02-10 MED ORDER — LORAZEPAM 1 MG PO TABS
1.0000 mg | ORAL_TABLET | Freq: Once | ORAL | Status: AC
  Administered 2019-02-10: 1 mg via ORAL
  Filled 2019-02-10: qty 1

## 2019-02-10 MED ORDER — ACETAMINOPHEN 650 MG RE SUPP: 650.0000 mg | Freq: Four times a day (QID) | RECTAL | Status: DC | PRN

## 2019-02-10 MED ORDER — SODIUM CHLORIDE 0.9 % IV BOLUS
500.0000 mL | Freq: Once | INTRAVENOUS | Status: AC
  Administered 2019-02-10: 500 mL via INTRAVENOUS

## 2019-02-10 MED ORDER — THIAMINE HCL 100 MG/ML IJ SOLN
100.0000 mg | Freq: Once | INTRAMUSCULAR | Status: AC
  Administered 2019-02-10: 100 mg via INTRAVENOUS
  Filled 2019-02-10: qty 2

## 2019-02-10 MED ORDER — SODIUM CHLORIDE 0.9% FLUSH
3.0000 mL | Freq: Two times a day (BID) | INTRAVENOUS | Status: DC
  Administered 2019-02-11 – 2019-02-14 (×7): 3 mL via INTRAVENOUS

## 2019-02-10 MED ORDER — ALBUTEROL SULFATE HFA 108 (90 BASE) MCG/ACT IN AERS: 2.0000 | INHALATION_SPRAY | Freq: Four times a day (QID) | RESPIRATORY_TRACT | Status: DC | PRN

## 2019-02-10 MED ORDER — ATORVASTATIN CALCIUM 10 MG PO TABS
20.0000 mg | ORAL_TABLET | Freq: Every day | ORAL | Status: DC
  Administered 2019-02-10 – 2019-02-13 (×4): 20 mg via ORAL
  Filled 2019-02-10 (×4): qty 2

## 2019-02-10 MED ORDER — ACETAMINOPHEN 325 MG PO TABS
650.0000 mg | ORAL_TABLET | Freq: Four times a day (QID) | ORAL | Status: DC | PRN
  Administered 2019-02-12: 650 mg via ORAL
  Filled 2019-02-10: qty 2

## 2019-02-10 MED ORDER — LORAZEPAM 2 MG/ML IJ SOLN
1.0000 mg | Freq: Once | INTRAMUSCULAR | Status: AC
  Administered 2019-02-10: 1 mg via INTRAVENOUS
  Filled 2019-02-10: qty 1

## 2019-02-10 NOTE — ED Notes (Signed)
ED TO INPATIENT HANDOFF REPORT  ED Nurse Name and Phone #: Raina Mina [phone]  S Name/Age/Gender Jason Bean 58 y.o. male Room/Bed: 036C/036C  Code Status   Code Status: DNR  Home/SNF/Other Home Patient oriented to: self, place, time and situation Is this baseline? Yes   Triage Complete: Triage complete  Chief Complaint alcohol withdrawal  Triage Note Pt reports he is experiencing ETOH withdrawal with a lot of bad thoughts going on. Pt reports last drink about 3/4am. Pt reports he drank about 3-4 beers. Pt reports he broke his foot in 3 places 3 weeks ago. Pt reports he left the Assisted living facility/rehab and just started thinking about drinking. EMS reports pt has been drinking nonstop for the past 3 weeks. Pt reports suicidal thoughts, reports he tried to drink enough so he wouldn't realize what he is doing. Pt has severe tremors, reports severe headache and nausea, no vomiting, reports moderate pins and needle feeling. Pt denies AVH.    Allergies No Known Allergies  Level of Care/Admitting Diagnosis ED Disposition    ED Disposition Condition Comment   Admit  Hospital Area: [facility] [[id number]]  Level of Care: Med-Surg [16]  Diagnosis: Alcoholic ketoacidosis [172225]  Admitting Physician: Anne Shutter [[id number]]  Attending Physician: Anne Shutter [[id number]]  Estimated length of stay: past midnight tomorrow  Certification:: I certify this patient will need inpatient services for at least 2 midnights  PT Class (Do Not Modify): Inpatient [101]  PT Acc Code (Do Not Modify): Private [1]       B Medical/Surgery History Past Medical History:  Diagnosis Date  . Anxiety   . Arthritis   . COPD (chronic obstructive pulmonary disease) (HCC)   . Depression   . Hyperlipidemia   . Hypertension    Past Surgical History:  Procedure Laterality Date  . BACK SURGERY    . NECK SURGERY    . ORTHOPEDIC SURGERY       A IV  Location/Drains/Wounds Patient Lines/Drains/Airways Status   Active Line/Drains/Airways    Name:   Placement date:   Placement time:   Site:   Days:   Peripheral IV 02/10/19 Left Antecubital   02/10/19    1747    Antecubital   less than 1          Intake/Output Last 24 hours No intake or output data in the 24 hours ending 02/10/19 2120  Labs/Imaging Results for orders placed or performed during the hospital encounter of 02/10/19 (from the past 48 hour(s))  Comprehensive metabolic panel     Status: Abnormal   Collection Time: 02/10/19  5:50 PM  Result Value Ref Range   Sodium 133 (L) 135 - 145 mmol/L   Potassium 3.9 3.5 - 5.1 mmol/L   Chloride 95 (L) 98 - 111 mmol/L   CO2 15 (L) 22 - 32 mmol/L   Glucose, Bld 81 70 - 99 mg/dL   BUN 11 6 - 20 mg/dL   Creatinine, Ser 0.26 0.61 - 1.24 mg/dL   Calcium 9.7 8.9 - 37.8 mg/dL   Total Protein 7.5 6.5 - 8.1 g/dL   Albumin 4.2 3.5 - 5.0 g/dL   AST 31 15 - 41 U/L   ALT 28 0 - 44 U/L   Alkaline Phosphatase 115 38 - 126 U/L   Total Bilirubin 1.0 0.3 - 1.2 mg/dL   GFR calc non Af Amer >60 >60 mL/min   GFR calc Af Amer >60 >60 mL/min   Anion  gap 23 (H) 5 - 15    Comment: REPEATED TO VERIFY Performed at [facility], [address], [place], [place] [address]   Ethanol     Status: Abnormal   Collection Time: 02/10/19  5:50 PM  Result Value Ref Range   Alcohol, Ethyl (B) 21 (H) <10 mg/dL    Comment: (NOTE) Lowest detectable limit for serum alcohol is 10 mg/dL. For medical purposes only. Performed at [facility], [address], [place], [place] [address]   CBC with Diff     Status: Abnormal   Collection Time: 02/10/19  5:50 PM  Result Value Ref Range   WBC 12.2 (H) 4.0 - 10.5 K/uL   RBC 5.06 4.22 - 5.81 MIL/uL   Hemoglobin 14.9 13.0 - 17.0 g/dL   HCT 09.8 11.9 - 14.7 %   MCV 89.9 80.0 - 100.0 fL   MCH 29.4 26.0 - 34.0 pg   MCHC 32.7 30.0 - 36.0 g/dL   RDW 82.9 56.2 - 13.0 %   Platelets 329 150 - 400 K/uL   nRBC  0.0 0.0 - 0.2 %   Neutrophils Relative % 57 %   Neutro Abs 6.9 1.7 - 7.7 K/uL   Lymphocytes Relative 30 %   Lymphs Abs 3.7 0.7 - 4.0 K/uL   Monocytes Relative 12 %   Monocytes Absolute 1.5 (H) 0.1 - 1.0 K/uL   Eosinophils Relative 0 %   Eosinophils Absolute 0.1 0.0 - 0.5 K/uL   Basophils Relative 1 %   Basophils Absolute 0.1 0.0 - 0.1 K/uL   Immature Granulocytes 0 %   Abs Immature Granulocytes 0.02 0.00 - 0.07 K/uL    Comment: Performed at [facility], [address] [place], [place] [address]  Acetaminophen level     Status: Abnormal   Collection Time: 02/10/19  5:50 PM  Result Value Ref Range   Acetaminophen (Tylenol), Serum <10 (L) 10 - 30 ug/mL    Comment: (NOTE) Therapeutic concentrations vary significantly. A range of 10-30 ug/mL  may be an effective concentration for many patients. However, some  are best treated at concentrations outside of this range. Acetaminophen concentrations >150 ug/mL at 4 hours after ingestion  and >50 ug/mL at 12 hours after ingestion are often associated with  toxic reactions. Performed at [facility], [address], [place], [place] [address]   Salicylate level     Status: None   Collection Time: 02/10/19  5:50 PM  Result Value Ref Range   Salicylate Lvl <7.0 2.8 - 30.0 mg/dL    Comment: Performed at [facility], [address] [place], [place] [address]  Magnesium     Status: None   Collection Time: 02/10/19  7:20 PM  Result Value Ref Range   Magnesium 2.2 1.7 - 2.4 mg/dL    Comment: Performed at [facility], [address], [place], [place] [address]  Phosphorus     Status: Abnormal   Collection Time: 02/10/19  7:20 PM  Result Value Ref Range   Phosphorus 2.2 (L) 2.5 - 4.6 mg/dL    Comment: Performed at [facility], [address] [place], [place] [address]  Urine rapid drug screen (hosp performed)     Status: Abnormal   Collection Time: 02/10/19  8:33 PM  Result Value Ref Range   Opiates NONE  DETECTED NONE DETECTED   Cocaine NONE DETECTED NONE DETECTED   Benzodiazepines POSITIVE (A) NONE DETECTED  Amphetamines NONE DETECTED NONE DETECTED   Tetrahydrocannabinol NONE DETECTED NONE DETECTED   Barbiturates NONE DETECTED NONE DETECTED    Comment: (NOTE) DRUG SCREEN FOR MEDICAL PURPOSES ONLY.  IF CONFIRMATION IS NEEDED FOR ANY PURPOSE, NOTIFY LAB WITHIN 5 DAYS. LOWEST DETECTABLE LIMITS FOR URINE DRUG SCREEN Drug Class                     Cutoff (ng/mL) Amphetamine and metabolites    1000 Barbiturate and metabolites    200 Benzodiazepine                 200 Tricyclics and metabolites     300 Opiates and metabolites        300 Cocaine and metabolites        300 THC                            50 Performed at [facility], [address] [place], [place] [address]    No results found.  Pending Labs Unresulted Labs (From admission, onward)    Start     Ordered   02/10/19 2046  HIV antibody (Routine Testing)  Once,   R     02/10/19 2052   02/10/19 2046  CBC  (heparin)  Once,   R    Comments:  Baseline for heparin therapy IF NOT ALREADY DRAWN.  Notify MD if PLT < 100 K.    02/10/19 2052   02/10/19 2046  Creatinine, serum  (heparin)  Once,   R    Comments:  Baseline for heparin therapy IF NOT ALREADY DRAWN.    02/10/19 2052          Vitals/Pain Today's Vitals   02/10/19 1724 02/10/19 1729  BP: (!) 166/97   Pulse: (!) 110   Resp: 20   Temp: 98.1 F (36.7 C)   TempSrc: Oral   SpO2: 98%   Height: 5\' 9"  (1.753 m)   PainSc:  10-Worst pain ever    Isolation Precautions No active isolations  Medications Medications  dextrose 5 %-0.9 % sodium chloride infusion ( Intravenous New Bag/Given 02/10/19 2120)  ALPRAZolam (XANAX) tablet 0.5 mg (has no administration in time range)  gabapentin (NEURONTIN) capsule 300 mg (has no administration in time range)  albuterol (PROVENTIL HFA;VENTOLIN HFA) 108 (90 Base) MCG/ACT inhaler 2 puff (has no administration in  time range)  atorvastatin (LIPITOR) tablet 20 mg (has no administration in time range)  heparin injection 5,000 Units (has no administration in time range)  sodium chloride flush (NS) 0.9 % injection 3 mL (has no administration in time range)  acetaminophen (TYLENOL) tablet 650 mg (has no administration in time range)    Or  acetaminophen (TYLENOL) suppository 650 mg (has no administration in time range)  senna-docusate (Senokot-S) tablet 1 tablet (has no administration in time range)  LORazepam (ATIVAN) tablet 1 mg (has no administration in time range)    Or  LORazepam (ATIVAN) injection 1 mg (has no administration in time range)  thiamine (VITAMIN B-1) tablet 100 mg (has no administration in time range)    Or  thiamine (B-1) injection 100 mg (has no administration in time range)  folic acid (FOLVITE) tablet 1 mg (has no administration in time range)  multivitamin with minerals tablet 1 tablet (has no administration in time range)  LORazepam (ATIVAN) tablet 1 mg (1 mg Oral Given 02/10/19 1735)  LORazepam (ATIVAN) injection 1 mg (  1 mg Intravenous Given 02/10/19 1804)  sodium chloride 0.9 % bolus 500 mL (0 mLs Intravenous Stopped 02/10/19 2041)  thiamine (B-1) injection 100 mg (100 mg Intravenous Given 02/10/19 2117)    Mobility non-ambulatory Moderate fall risk   Focused Assessments Cardiac Assessment Handoff:    Lab Results  Component Value Date   CKTOTAL 1,613 (H) 09/22/2010   CKMB (HH) 09/21/2010    20.0 REPEATED TO VERIFY CRITICAL VALUE NOTED.  VALUE IS CONSISTENT WITH PREVIOUSLY REPORTED AND CALLED VALUE.   TROPONINI 0.01        NO INDICATION OF MYOCARDIAL INJURY. 09/21/2010   No results found for: DDIMER Does the Patient currently have chest pain? No     R Recommendations: See Admitting Provider Note  Report given to:   Additional Notes:

## 2019-02-10 NOTE — ED Notes (Signed)
RN attempted to call report 

## 2019-02-10 NOTE — ED Notes (Signed)
Report given.

## 2019-02-10 NOTE — ED Triage Notes (Addendum)
Pt reports he is experiencing ETOH withdrawal with a lot of bad thoughts going on. Pt reports last drink about 3/4am. Pt reports he drank about 3-4 beers. Pt reports he broke his foot in 3 places 3 weeks ago. Pt reports he left the Assisted living facility/rehab and just started thinking about drinking. EMS reports pt has been drinking nonstop for the past 3 weeks. Pt reports suicidal thoughts, reports he tried to drink enough so he wouldn't realize what he is doing. Pt has severe tremors, reports severe headache and nausea, no vomiting, reports moderate pins and needle feeling. Pt denies AVH.

## 2019-02-10 NOTE — H&P (Signed)
Date: 02/10/2019               Patient Name:  Jason Bean MRN: [id number]  DOB: [birth year] Age / Sex: 58 y.o., male   PCP: Patient, No Pcp Per         Medical Service: Internal Medicine Teaching Service         Attending Physician: Dr. Sandre Kitty, Elwin Mocha, MD    First Contact: Dr. Hermine Messick Pager: [phone]  Second Contact: Dr. Reymundo Poll Pager: [phone]       After Hours (After 5p/  First Contact Pager: [phone]  weekends / holidays): Second Contact Pager: [phone]   Chief Complaint: Alcohol withdrawal and suicidal ideation  History of Present Illness: Mr. Jason Bean is a 58 year old male with alcohol use disorder, alcohol withdrawal and seizure, depression, COPD, and HTN who presents with diaphoresis, nausea, and tremors after not drinking alcohol for the last several hours.  He reports breaking his foot 3 weeks ago after falling while intoxicated.  He was discharged to an assisted living facility/rehab center in [place], [place] and stayed there for 3 weeks.  He states that he became very frustrated with not being able to get his alprazolam prescription while in the facility and decided to leave weeks ago.  Since being discharged he states that he began drinking heavily again.  Spending almost $400 in the past 2 weeks on alcohol.  States that he last drank 3-4 beers at 4 AM last night.  Since then he has been having agitation, diaphoresis, nausea, and tremors.  He has a history of local withdrawal seizures and became concerned that he would have another one today.  He also reports suicidal ideations for the last several days.  He said that he wanted to take multiple pills to end his life but ran out of his medications.  He denies any ingestion today including over-the-counter medications, prescription medications, cleaning solutions, or other solvents.  In the ED, he was found to be tremulous, anxious, agitated, with tactile disturbances. CIWA of 19. He was given a total of 2 mg  PO Ativan with improvement in anxiety and tremors. Labs were significant for elevated anion gap of 23, elevated ethanol to 21, leukocytosis of 12.2 with neutrophil predominance, negative acetaminophen and salicylate levels.  Meds:  Current Meds  Medication Sig  . albuterol (PROVENTIL HFA;VENTOLIN HFA) 108 (90 Base) MCG/ACT inhaler Inhale 2 puffs into the lungs every 6 (six) hours as needed for wheezing or shortness of breath.  . ALPRAZolam (XANAX) 0.5 MG tablet Take 0.5 mg by mouth 4 (four) times daily.  Marland Kitchen amitriptyline (ELAVIL) 25 MG tablet Take 25 mg by mouth at bedtime.  Marland Kitchen atorvastatin (LIPITOR) 20 MG tablet Take 20 mg by mouth daily.  . carbamazepine (TEGRETOL) 100 MG chewable tablet Chew 100 mg by mouth 3 (three) times daily.  Marland Kitchen escitalopram (LEXAPRO) 10 MG tablet Take 10 mg by mouth daily.  Marland Kitchen gabapentin (NEURONTIN) 300 MG capsule Take 300 mg by mouth 3 (three) times daily.  . mirtazapine (REMERON) 15 MG tablet Take 15 mg by mouth at bedtime.  . traZODone (DESYREL) 50 MG tablet Take 50 mg by mouth at bedtime as needed.  . venlafaxine XR (EFFEXOR-XR) 75 MG 24 hr capsule Take 150 mg by mouth daily.     Allergies: Allergies as of 02/10/2019  . (No Known Allergies)   Past Medical History:  Diagnosis Date  . Anxiety   . Arthritis   . COPD (  chronic obstructive pulmonary disease) (HCC)   . Depression   . Hyperlipidemia   . Hypertension     Family History: No significant family history  Social History: Mr. Jason Bean [place].  Since being discharged from the assisted living facility he has been living with a friend.  He does state that he will be unable to live there after this admission and may become homeless.  He is currently on disability and does not work.  He reports drinking alcohol since he was 58 years old.  He has gone through periods of drinking heavily as well as complete abstinence.  He did not drink any alcohol in the 3 weeks that he was living in  assisted living facility.  For the past 2 weeks he has started to drink heavily again.  He denies any illicit drug use.  He does report using alprazolam for anxiety.  He has a 30-pack-year smoking history and smokes 1 pack/day.  Review of Systems: A complete ROS was negative except as per HPI.   Physical Exam: Blood pressure (!) 166/97, pulse (!) 110, temperature 98.1 F (36.7 C), temperature source Oral, resp. rate 20, height 5\' 9"  (1.753 m), SpO2 98 %. General: Lying in bed in no acute distress.  No diaphoresis or agitation noted. Cardiovascular: Tachycardic, normal rhythm Respiratory: Clear to auscultation bilaterally, normal work of breathing, no respiratory distress Abdominal: No tenderness to palpation, soft, no masses appreciated MSK: No lower extremity edema noted bilaterally, no tenderness to palpation of lower extremities, old scar at right knee Skin: Dry, intact Neuro: Alert and oriented Psych: Suicidal ideation, depressed mood, normal behavior   Assessment & Plan by Problem: Active Problems:   Alcohol dependence (HCC)   Severe recurrent major depression without psychotic features (HCC)   Alcoholic ketoacidosis  Mr. Jason Bean is a 57 year old male with alcohol use disorder, history of alcohol withdrawal seizures, and depression who presents with alcohol withdrawal symptoms and suicidal ideation.  Alcohol use disorder: - CIWA with Ativan - Thiamine  Major depressive disorder with active suicidal ideation: - Corporate investment banker - Consult psychiatry  Alcoholic ketoacidosis: - D5 normal saline - Repeat BMP in several hours  Hypophosphatemia: Phos 2.2 - K-Phos  COPD: - Albuterol nebulizer treatments every 6 hours PRN wheezing  FEN/GI: Regular diet, D5 normal saline CODE STATUS: DNR DVT prophylaxis: Subcu heparin  Dispo: Admit patient to Inpatient with expected length of stay greater than 2 midnights.  Signed: Synetta Shadow, MD 02/10/2019, 7:21 PM

## 2019-02-10 NOTE — ED Notes (Signed)
2 bags of belongings brought to locker #4. Valuables envelope brought to security. Pt changed into purple scrubs and wanded by security. Precautions taken: cords removed from room. Sitter ordered. Door remains open.

## 2019-02-10 NOTE — ED Provider Notes (Addendum)
[facility] EMERGENCY DEPARTMENT Provider Note   CSN: [id number] Arrival date & time: 02/10/19  1721    History   Chief Complaint Chief Complaint  Patient presents with  . Alcohol Intoxication    HPI Jason Bean is a 58 y.o. male with history of alcoholism, alcohol withdrawal and seizure, depression, COPD, hypertension presenting today for alcohol withdrawal and suicidal ideation.  Patient reports that he has recently left a rehabilitation facility and falling back into drinking daily.  Patient reports last drink was late last night, no alcohol intake today.  Patient states that he has been having withdrawal symptoms including agitation, sweating, nausea and tremors he is concerned that he may have a seizure today as he has had these in the past.  Patient also endorses suicidal ideations he planned to take his life today by taking off his medications however could not find his pill bottles.  Patient denies ingestion today or any attempt to injure himself.    HPI  Past Medical History:  Diagnosis Date  . Anxiety   . Arthritis   . COPD (chronic obstructive pulmonary disease) (HCC)   . Depression   . Hyperlipidemia   . Hypertension     Patient Active Problem List   Diagnosis Date Noted  . MDD (major depressive disorder), severe (HCC) 11/22/2017  . Alcohol abuse with intoxication (HCC)   . Severe recurrent major depression without psychotic features (HCC) 06/13/2017  . Alcohol dependence (HCC) 11/27/2012  . GERD (gastroesophageal reflux disease) 02/12/2012  . Hypertension 01/06/2012  . COPD (chronic obstructive pulmonary disease) (HCC) 01/06/2012  . Hyperlipidemia 01/06/2012  . Anxiety 01/06/2012  . Neck pain, chronic 01/06/2012    Past Surgical History:  Procedure Laterality Date  . BACK SURGERY    . NECK SURGERY    . ORTHOPEDIC SURGERY          Home Medications    Prior to Admission medications   Medication Sig Start Date End Date  Taking? Authorizing Provider  albuterol (PROVENTIL HFA;VENTOLIN HFA) 108 (90 Base) MCG/ACT inhaler Inhale 2 puffs into the lungs every 6 (six) hours as needed for wheezing or shortness of breath. 11/26/17   Money, Gerlene Burdock, FNP    Family History History reviewed. No pertinent family history.  Social History Social History   Tobacco Use  . Smoking status: Current Every Day Smoker    Packs/day: 1.00    Years: 30.00    Pack years: 30.00    Types: Cigarettes  . Smokeless tobacco: Never Used  Substance Use Topics  . Alcohol use: Yes    Comment: Pt drinks daily . unknown amount pt. reports "about 5-6 40 oz beers daily over last week  . Drug use: Yes    Types: Benzodiazepines    Comment: pt. reports "alprazolam 0.5 TID     Allergies   Patient has no known allergies.   Review of Systems Review of Systems  Constitutional: Positive for diaphoresis. Negative for chills and fever.  Eyes: Negative.  Negative for visual disturbance.  Respiratory: Negative.  Negative for cough and shortness of breath.   Cardiovascular: Negative.  Negative for chest pain.  Gastrointestinal: Positive for nausea. Negative for abdominal pain, diarrhea and vomiting.  Musculoskeletal: Negative.  Negative for arthralgias, myalgias and neck pain.  Skin: Negative.  Negative for wound.  Neurological: Positive for tremors and headaches. Negative for dizziness, seizures, weakness and numbness.  Psychiatric/Behavioral: Positive for agitation and suicidal ideas. Negative for hallucinations. The patient is nervous/anxious.  All other systems reviewed and are negative.    Physical Exam Updated Vital Signs BP (!) 166/97 (BP Location: Right Arm)   Pulse (!) 110   Temp 98.1 F (36.7 C) (Oral)   Resp 20   Ht  (1.753 m)   SpO2 98%   BMI 23.15 kg/m   Physical Exam Constitutional:      General: He is not in acute distress.    Appearance: Normal appearance. He is well-developed. He is not ill-appearing or  diaphoretic.  HENT:     Head: Normocephalic and atraumatic. No raccoon eyes, Battle's sign, abrasion or contusion.     Jaw: There is normal jaw occlusion. No trismus.     Right Ear: Tympanic membrane, ear canal and external ear normal. No hemotympanum.     Left Ear: Tympanic membrane, ear canal and external ear normal. No hemotympanum.     Nose: Nose normal. No rhinorrhea.     Right Nostril: No epistaxis.     Left Nostril: No epistaxis.     Mouth/Throat:     Lips: Pink.     Mouth: Mucous membranes are moist.     Pharynx: Oropharynx is clear. Uvula midline.  Eyes:     General: Vision grossly intact. Gaze aligned appropriately.     Extraocular Movements: Extraocular movements intact.     Pupils: Pupils are equal, round, and reactive to light.     Comments: Visual fields grossly intact bilaterally  Neck:     Musculoskeletal: Full passive range of motion without pain, normal range of motion and neck supple.     Trachea: Trachea and phonation normal. No tracheal deviation.  Cardiovascular:     Rate and Rhythm: Regular rhythm. Tachycardia present.     Pulses:          Dorsalis pedis pulses are 2+ on the right side and 2+ on the left side.       Posterior tibial pulses are 2+ on the right side and 2+ on the left side.     Heart sounds: Normal heart sounds.  Pulmonary:     Effort: Pulmonary effort is normal. No accessory muscle usage or respiratory distress.     Breath sounds: Normal breath sounds and air entry.  Chest:     Chest wall: No deformity, tenderness or crepitus.  Abdominal:     General: There is no distension.     Palpations: Abdomen is soft.     Tenderness: There is no abdominal tenderness. There is no guarding or rebound.  Musculoskeletal: Normal range of motion.     Right lower leg: Normal.     Left lower leg: Normal.     Comments: No midline C/T/L spinal tenderness to palpation, no paraspinal muscle tenderness, no deformity, crepitus, or step-off noted. No sign of injury  to the neck or back.  Tenderness to palpation of the hips.  Patient is able perform soft to seated position without difficulty.  Feet:     Right foot:     Protective Sensation: 3 sites tested. 3 sites sensed.     Left foot:     Protective Sensation: 3 sites tested. 3 sites sensed.  Skin:    General: Skin is warm and moist.     Capillary Refill: Capillary refill takes less than 2 seconds.     Comments: Slightly diaphoretic  Neurological:     Mental Status: He is alert.     GCS: GCS eye subscore is 4. GCS verbal subscore is 5.  GCS motor subscore is 6.     Comments: Mental Status: Alert, oriented, thought content appropriate, able to give a coherent history. Speech fluent without evidence of aphasia. Able to follow 2 step commands without difficulty. Cranial Nerves: II: Peripheral visual fields grossly normal, pupils equal, round, reactive to light III,IV, VI: ptosis not present, extra-ocular motions intact bilaterally V,VII: smile symmetric, eyebrows raise symmetric, facial light touch sensation equal VIII: hearing grossly normal to voice X: uvula elevates symmetrically XI: bilateral shoulder shrug symmetric and strong XII: midline tongue extension without fassiculations Motor: Normal tone. 5/5 strength in upper and lower extremities bilaterally including strong and equal grip strength and dorsiflexion/plantar flexion Sensory: Sensation intact to light touch in all extremities.Negative Romberg.  Cerebellar: normal finger-to-nose with bilateral upper extremities. Normal heel-to -shin balance bilaterally of the lower extremity. No pronator drift.  CV: distal pulses palpable throughout  Psychiatric:        Behavior: Behavior normal.    ED Treatments / Results  Labs (all labs ordered are listed, but only abnormal results are displayed) Labs Reviewed  COMPREHENSIVE METABOLIC PANEL - Abnormal; Notable for the following components:      Result Value   Sodium 133 (*)     Chloride 95 (*)    CO2 15 (*)    Anion gap 23 (*)    All other components within normal limits  ETHANOL - Abnormal; Notable for the following components:   Alcohol, Ethyl (B) 21 (*)    All other components within normal limits  CBC WITH DIFFERENTIAL/PLATELET - Abnormal; Notable for the following components:   WBC 12.2 (*)    Monocytes Absolute 1.5 (*)    All other components within normal limits  ACETAMINOPHEN LEVEL - Abnormal; Notable for the following components:   Acetaminophen (Tylenol), Serum <10 (*)    All other components within normal limits  SALICYLATE LEVEL  RAPID URINE DRUG SCREEN, HOSP PERFORMED    EKG None  Radiology No results found.  Procedures Procedures (including critical care time)  Medications Ordered in ED Medications  LORazepam (ATIVAN) tablet 1 mg (1 mg Oral Given 02/10/19 1735)  LORazepam (ATIVAN) injection 1 mg (1 mg Intravenous Given 02/10/19 1804)  sodium chloride 0.9 % bolus 500 mL (500 mLs Intravenous New Bag/Given 02/10/19 1839)     Initial Impression / Assessment and Plan / ED Course  I have reviewed the triage vital signs and the nursing notes.  Pertinent labs & imaging results that were available during my care of the patient were reviewed by me and considered in my medical decision making (see chart for details).  Clinical Course as of Feb 10 2104  Thu Feb 10, 2019  1944 Discussed with internal medicine service who will see patient for admission.   [BM]    Clinical Course User Index [BM] Bill Salinas, PA-C   58 year old male patient presents today for alcohol withdrawal as well as suicidal ideations.  Patient with history of alcohol withdrawal seizures.  On arrival patient is tremulous, anxious, agitated, slightly diaphoretic, tactile disturbances, no auditory/visual hallucinations. CIWA of 19.  Patient was given 1 mg p.o. Ativan by nursing staff.  Additional 1 mg IV Ativan ordered.  Anxiety and tremors improved.  Patient will need  admission for alcohol withdrawal and suicidal ideations. - Salicylate level negative Acetaminophen level negative Ethanol of 21 CBC with mild leukocytosis of 12.2 CMP with anion gap of 23, suspect secondary to etoh keto, discussed with admitting; patient denies coingestion. Osm added by Dr.  Schlossman.  UDS positive for benzodiazepines, urinalysis pending. - Case discussed with Dr. Dalene Seltzer, consult called for admission. - Patient re-evaluated, resting comfortably no acute distress endorses improvement of symptoms today. Is agreeable for admission. - Discussed case with admitting team, patient has been seen and evaluated here in the emergency department and has been admitted to their service for further evaluation and management.  Note: Portions of this report may have been transcribed using voice recognition software. Every effort was made to ensure accuracy; however, inadvertent computerized transcription errors may still be present. Final Clinical Impressions(s) / ED Diagnoses   Final diagnoses:  Alcohol withdrawal syndrome without complication [facility])    ED Discharge Orders    None       Elizabeth Palau 02/10/19 2137    Bill Salinas, PA-C 02/10/19 2147    Alvira Monday, MD 02/11/19 [phone]

## 2019-02-10 NOTE — ED Notes (Signed)
Pt changed into maroon scrubs and wanded by security 

## 2019-02-10 NOTE — ED Notes (Signed)
Patient reminded of need of urine specimen. Given PO fluids.

## 2019-02-11 DIAGNOSIS — F10939 Alcohol use, unspecified with withdrawal, unspecified: Secondary | ICD-10-CM | POA: Diagnosis present

## 2019-02-11 DIAGNOSIS — Z79899 Other long term (current) drug therapy: Secondary | ICD-10-CM

## 2019-02-11 DIAGNOSIS — E872 Acidosis: Secondary | ICD-10-CM

## 2019-02-11 DIAGNOSIS — Z915 Personal history of self-harm: Secondary | ICD-10-CM

## 2019-02-11 DIAGNOSIS — F332 Major depressive disorder, recurrent severe without psychotic features: Secondary | ICD-10-CM

## 2019-02-11 DIAGNOSIS — J449 Chronic obstructive pulmonary disease, unspecified: Secondary | ICD-10-CM

## 2019-02-11 DIAGNOSIS — F1994 Other psychoactive substance use, unspecified with psychoactive substance-induced mood disorder: Secondary | ICD-10-CM | POA: Diagnosis present

## 2019-02-11 DIAGNOSIS — F419 Anxiety disorder, unspecified: Secondary | ICD-10-CM

## 2019-02-11 DIAGNOSIS — F1014 Alcohol abuse with alcohol-induced mood disorder: Secondary | ICD-10-CM

## 2019-02-11 DIAGNOSIS — F10239 Alcohol dependence with withdrawal, unspecified: Secondary | ICD-10-CM | POA: Diagnosis present

## 2019-02-11 DIAGNOSIS — Z8781 Personal history of (healed) traumatic fracture: Secondary | ICD-10-CM

## 2019-02-11 DIAGNOSIS — Z66 Do not resuscitate: Secondary | ICD-10-CM

## 2019-02-11 DIAGNOSIS — I10 Essential (primary) hypertension: Secondary | ICD-10-CM

## 2019-02-11 DIAGNOSIS — R825 Elevated urine levels of drugs, medicaments and biological substances: Secondary | ICD-10-CM

## 2019-02-11 DIAGNOSIS — F17210 Nicotine dependence, cigarettes, uncomplicated: Secondary | ICD-10-CM

## 2019-02-11 DIAGNOSIS — R45851 Suicidal ideations: Secondary | ICD-10-CM

## 2019-02-11 DIAGNOSIS — R011 Cardiac murmur, unspecified: Secondary | ICD-10-CM

## 2019-02-11 LAB — BASIC METABOLIC PANEL
Anion gap: 15 (ref 5–15)
Anion gap: 16 — ABNORMAL HIGH (ref 5–15)
BUN: 12 mg/dL (ref 6–20)
BUN: 11 mg/dL (ref 6–20)
CO2: 22 mmol/L (ref 22–32)
CO2: 20 mmol/L — ABNORMAL LOW (ref 22–32)
Calcium: 9 mg/dL (ref 8.9–10.3)
Calcium: 8.6 mg/dL — ABNORMAL LOW (ref 8.9–10.3)
Chloride: 97 mmol/L — ABNORMAL LOW (ref 98–111)
Chloride: 101 mmol/L (ref 98–111)
Creatinine, Ser: 0.85 mg/dL (ref 0.61–1.24)
Creatinine, Ser: 0.82 mg/dL (ref 0.61–1.24)
GFR calc Af Amer: >60 mL/min (ref >60)
GFR calc Af Amer: >60 mL/min (ref >60)
GFR calc non Af Amer: >60 mL/min (ref >60)
GFR calc non Af Amer: >60 mL/min (ref >60)
Glucose, Bld: 88 mg/dL (ref 70–99)
Glucose, Bld: 98 mg/dL (ref 70–99)
Potassium: 3.6 mmol/L (ref 3.5–5.1)
Potassium: 3.6 mmol/L (ref 3.5–5.1)
Sodium: 134 mmol/L — ABNORMAL LOW (ref 135–145)
Sodium: 137 mmol/L (ref 135–145)

## 2019-02-11 LAB — PHOSPHORUS: Phosphorus: 3.2 mg/dL (ref 2.5–4.6)

## 2019-02-11 LAB — HIV ANTIBODY (ROUTINE TESTING W REFLEX): HIV Screen 4th Generation wRfx: NONREACTIVE

## 2019-02-11 LAB — LACTIC ACID, PLASMA: Lactic Acid, Venous: 0.7 mmol/L (ref 0.5–1.9)

## 2019-02-11 MED ORDER — OXYCODONE HCL 5 MG PO TABS
5.0000 mg | ORAL_TABLET | Freq: Once | ORAL | Status: AC
  Administered 2019-02-11: 5 mg via ORAL
  Filled 2019-02-11: qty 1

## 2019-02-11 MED ORDER — ESCITALOPRAM OXALATE 10 MG PO TABS
10.0000 mg | ORAL_TABLET | Freq: Every day | ORAL | Status: DC
  Administered 2019-02-11 – 2019-02-14 (×4): 10 mg via ORAL
  Filled 2019-02-11 (×4): qty 1

## 2019-02-11 NOTE — Progress Notes (Signed)
   Subjective: Mr. Jewart reports that he was feeling bad today, no acute events overnight. He reports that he was still having some nausea, last emesis episode was last night.  He also reports that he has been having some right foot pain, where he had his surgical procedure done.  He reported that he feels like he needs help and is open to seeing psychiatry.  Denied any other complaints at this time.  We discussed that we will need to watch him for a few days due to the risk of withdrawal. All questions were answered.   Objective:  Vital signs in last 24 hours: Vitals:   02/10/19 1724 02/10/19 2123 02/10/19 2200 02/11/19 0518  BP: (!) 166/97 (!) 137/93 135/82 127/84  Pulse: (!) 110 (!) 101 97 86  Resp: 20 20 20 18   Temp: 98.1 F (36.7 C)  98.7 F (37.1 C) 98.1 F (36.7 C)  TempSrc: Oral  Oral Oral  SpO2: 98% 97% 96% 95%  Weight:   92.5 kg   Height: 5\' 9"  (1.753 m)  5\' 9"  (1.753 m)     General: Depressed affect, minimal distress Cardiac: RRR, systolic murmur Pulmonary: CTABL, no wheezing or rhonchi Abdomen: Soft, non-tender, normoactive bowel sounds Extremity: Right ankle swollen, sensation intact, pulses intact, warm extremities Psychiatry: Depressed mood,   Assessment/Plan:  Active Problems:   Alcohol dependence (HCC)   Severe recurrent major depression without psychotic features (HCC)   Alcoholic ketoacidosis  This is a 58 year old male with a history of EtOH use disorder, EtOH withdrawal and seizures, COPD, depression, and HTN who presented with diaphoresis, nausea and tremors. Last drink was 4 AM 3/19.   Alcohol use disorder, withdrawal:  Patient is continuing to have elevated CIWAs requiring Ativan.  He is felt 24 hours since his last drink, he also had some alcohol in his system and may have been intoxicated when he initially presented.  We will need to monitor him for a few days until he is out of the withdrawal window.  He is very high risk patient given his prior  history of alcohol withdrawal seizures.  He also had, ketoacidosis when he initially came in, he was given D5 normal saline and a repeat BMP showed improvement in his anion gap. -Continue CIWA with Ativan -Continue thiamine -Daily BMP -If patient continues to have elevated CIWA scores requiring multiple doses of Ativan we may consider starting Librium  Major depressive disorder: Patient reported SI on admission, he appears very depressed today.  He also seems to have some very social issues, reported that he may be unable to live where he was previously living when he was discharged, and may become homeless. He is on lexapro at home.  -Psychiatry consult -May start an antidepressant  Anxiety: Patient appears to be alprazolam at home, 0.5 mg 4 times daily. -Continue home medications, appreciate psychiatry recommendations  Hypophosphatemia: -Initial phosphate was 2.2, this was repleted with potassium phosphate did improve to 3.2. -Continue to monitor  COPD: -Patient uses albuterol at home. -Continue albuterol as needed  FEN: No fluids, replete lytes prn, regular diet VTE ppx: Sub q Heparin Code Status: DNR   Dispo: Anticipated discharge in approximately 2-3 day(s).   Claudean Severance, MD 02/11/2019, 6:46 AM Pager: [phone]

## 2019-02-11 NOTE — Consult Note (Signed)
[facility] Consult   Reason for Consult:  Depression with SI Referring Physician:  Dr. Jessy OtoAlexander Raines  Patient Identification: Jason Bean MRN:  [id number] Principal Diagnosis: Substance induced mood disorder (HCC) Diagnosis:  Active Problems:   Alcohol dependence (HCC)   Severe recurrent major depression without psychotic features (HCC)   Alcoholic ketoacidosis   Total Time spent with patient: 1 hour   Subjective:   Jason Bean is a 58 y.o. male patient admitted with alcohol withdrawal.  HPI:   Per chart review, patient was admitted with alcohol withdrawal and SI. He was discharged from an ALF 3 weeks ago. He was initially admitted to a living facility because he fell and broke his foot while intoxicated. He was not able to receive Xanax while in this facility so he decided to leave. He reports immediately drinking at discharge. He reports SI for several days with a plan to overdose on his medications. Home medications include Xanax 0.5 mg QID, Elavil 25 mg qhs, Tegretol 100 mg TID, Lexapro 10 mg daily, Gabapentin 300 mg TID, Remeron 15 mg qhs, Trazodone 50 mg qhs PRN and Effexor 150 mg daily. He was restarted on Xanax and Gabapentin in the hospital. PMP indicates that he last received a one month prescription for Xanax on 3/2. He had not received a prescription since 11/08/18. BAL was 21 on admission and UDS was psoitive for benzodiazepines.   On interview, Jason Bean reports feelings of depression due to multiple psychosocial stressors.  He reports a lack of social support and reports, "I hurt a lot of people and no one will miss me if I am gone."  He reports relapsing on alcohol use 2 weeks ago after being sober for 6-7 months.  He left the rehab facility where he was staying after he broke his leg because he wanted to drink again.  He reports that he was doing well for 2 years while volunteering at [employer].  He has been unable to volunteer there since his  injury.  He required surgery (s/p rods) and uses a walker to ambulate now.  He endorses SI with a plan to overdose on his medications or to "drink himself to death."  He reports a history of a suicide attempt by cutting his wrists 20 years ago.  He denies HI or AVH.  He denies problems with sleep.  He reports poor appetite with a 15 pound weight loss over the past month.  He denies a history of symptoms consistent with mania.  He reports that he has not been taking his medications for the past 2 weeks.  They were started while at rehab.  His medications have been restarted in the hospital.  He denies access to guns or weapons.  Past Psychiatric History: Alcohol abuse, anxiety and MDD.  Risk to Self:  Yes endorses SI with plan.  Risk to Others:  None. Denies HI.  Prior Inpatient Therapy:  He has a history of multiple hospitalizations. [facility] He was discharged on Prozac, Trileptal, Prazosin and Trazodone. Xanax was discontinued.  Prior Outpatient Therapy:  [facility]  Past Medical History:  Past Medical History:  Diagnosis Date  . Anxiety   . Arthritis   . COPD (chronic obstructive pulmonary disease) (HCC)   . Depression   . Hyperlipidemia   . Hypertension     Past Surgical History:  Procedure Laterality Date  . BACK SURGERY    . NECK SURGERY    .  ORTHOPEDIC SURGERY     Family History: History reviewed. No pertinent family history. Family Psychiatric  History: Mother-committed suicide by overdose when he was 58 y/o.   Social History:  Social History   Substance and Sexual Activity  Alcohol Use Yes   Comment: Pt drinks daily . unknown amount pt. reports "about 5-6 40 oz beers daily over last week     Social History   Substance and Sexual Activity  Drug Use Yes  . Types: Benzodiazepines   Comment: pt. reports "alprazolam 0.5 TID    Social History   Socioeconomic History  . Marital status: Single    Spouse name: Not on file  . Number of  children: Not on file  . Years of education: Not on file  . Highest education level: Not on file  Occupational History  . Not on file  Social Needs  . Financial resource strain: Not on file  . Food insecurity:    Worry: Not on file    Inability: Not on file  . Transportation needs:    Medical: Not on file    Non-medical: Not on file  Tobacco Use  . Smoking status: Current Every Day Smoker    Packs/day: 1.00    Years: 30.00    Pack years: 30.00    Types: Cigarettes  . Smokeless tobacco: Never Used  Substance and Sexual Activity  . Alcohol use: Yes    Comment: Pt drinks daily . unknown amount pt. reports "about 5-6 40 oz beers daily over last week  . Drug use: Yes    Types: Benzodiazepines    Comment: pt. reports "alprazolam 0.5 TID  . Sexual activity: Not Currently  Lifestyle  . Physical activity:    Days per week: Not on file    Minutes per session: Not on file  . Stress: Not on file  Relationships  . Social connections:    Talks on phone: Not on file    Gets together: Not on file    Attends religious service: Not on file    Active member of club or organization: Not on file    Attends meetings of clubs or organizations: Not on file    Relationship status: Not on file  Other Topics Concern  . 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  Has daughter   Additional Social History: He was previously in an ALF and has been staying with a friend since discharge. He is unable to return to his friend's home so he is currently homeless. He has two adult daughters with which he reports an estranged relationship. He has been drinking alcohol since 58 y/o. He has a history of alcohol withdrawal seizures. He denies illicit substance use.     Allergies:  No Known Allergies  Labs:  Results for orders placed or performed during the hospital encounter of 02/10/19 (from the past 48 hour(s))  Comprehensive metabolic panel      Status: Abnormal   Collection Time: 02/10/19  5:50 PM  Result Value Ref Range   Sodium 133 (L) 135 - 145 mmol/L   Potassium 3.9 3.5 - 5.1 mmol/L   Chloride 95 (L) 98 - 111 mmol/L   CO2 15 (L) 22 - 32 mmol/L   Glucose, Bld 81 70 - 99 mg/dL   BUN 11 6 - 20 mg/dL   Creatinine, Ser 0.45 0.61 - 1.24 mg/dL   Calcium 9.7 8.9 - 40.9 mg/dL  Total Protein 7.5 6.5 - 8.1 g/dL   Albumin 4.2 3.5 - 5.0 g/dL   AST 31 15 - 41 U/L   ALT 28 0 - 44 U/L   Alkaline Phosphatase 115 38 - 126 U/L   Total Bilirubin 1.0 0.3 - 1.2 mg/dL   GFR calc non Af Amer >60 >60 mL/min   GFR calc Af Amer >60 >60 mL/min   Anion gap 23 (H) 5 - 15    Comment: REPEATED TO VERIFY Performed at [facility], [address], [place], [place] [address]   Ethanol     Status: Abnormal   Collection Time: 02/10/19  5:50 PM  Result Value Ref Range   Alcohol, Ethyl (B) 21 (H) <10 mg/dL    Comment: (NOTE) Lowest detectable limit for serum alcohol is 10 mg/dL. For medical purposes only. Performed at [facility], [address], [place], [place] [address]   CBC with Diff     Status: Abnormal   Collection Time: 02/10/19  5:50 PM  Result Value Ref Range   WBC 12.2 (H) 4.0 - 10.5 K/uL   RBC 5.06 4.22 - 5.81 MIL/uL   Hemoglobin 14.9 13.0 - 17.0 g/dL   HCT 17.7 11.6 - 57.9 %   MCV 89.9 80.0 - 100.0 fL   MCH 29.4 26.0 - 34.0 pg   MCHC 32.7 30.0 - 36.0 g/dL   RDW 03.8 33.3 - 83.2 %   Platelets 329 150 - 400 K/uL   nRBC 0.0 0.0 - 0.2 %   Neutrophils Relative % 57 %   Neutro Abs 6.9 1.7 - 7.7 K/uL   Lymphocytes Relative 30 %   Lymphs Abs 3.7 0.7 - 4.0 K/uL   Monocytes Relative 12 %   Monocytes Absolute 1.5 (H) 0.1 - 1.0 K/uL   Eosinophils Relative 0 %   Eosinophils Absolute 0.1 0.0 - 0.5 K/uL   Basophils Relative 1 %   Basophils Absolute 0.1 0.0 - 0.1 K/uL   Immature Granulocytes 0 %   Abs Immature Granulocytes 0.02 0.00 - 0.07 K/uL    Comment: Performed at [facility] Lab, [address], [place],  [place] [address]  Acetaminophen level     Status: Abnormal   Collection Time: 02/10/19  5:50 PM  Result Value Ref Range   Acetaminophen (Tylenol), Serum <10 (L) 10 - 30 ug/mL    Comment: (NOTE) Therapeutic concentrations vary significantly. A range of 10-30 ug/mL  may be an effective concentration for many patients. However, some  are best treated at concentrations outside of this range. Acetaminophen concentrations >150 ug/mL at 4 hours after ingestion  and >50 ug/mL at 12 hours after ingestion are often associated with  toxic reactions. Performed at [facility] Lab, [address], [place], [place] [address]   Salicylate level     Status: None   Collection Time: 02/10/19  5:50 PM  Result Value Ref Range   Salicylate Lvl <7.0 2.8 - 30.0 mg/dL    Comment: Performed at [facility] Lab, [address], [place], [place] [address]  Magnesium     Status: None   Collection Time: 02/10/19  7:20 PM  Result Value Ref Range   Magnesium 2.2 1.7 - 2.4 mg/dL    Comment: Performed at [facility] Lab, [address] [place], [place] [address]  Phosphorus     Status: Abnormal   Collection Time: 02/10/19  7:20 PM  Result Value Ref Range   Phosphorus  2.2 (L) 2.5 - 4.6 mg/dL    Comment: Performed at [facility] Lab, [address] [place], [place] [address]  Urine rapid drug screen (hosp performed)     Status: Abnormal   Collection Time: 02/10/19  8:33 PM  Result Value Ref Range   Opiates NONE DETECTED NONE DETECTED   Cocaine NONE DETECTED NONE DETECTED   Benzodiazepines POSITIVE (A) NONE DETECTED   Amphetamines NONE DETECTED NONE DETECTED   Tetrahydrocannabinol NONE DETECTED NONE DETECTED   Barbiturates NONE DETECTED NONE DETECTED    Comment: (NOTE) DRUG SCREEN FOR MEDICAL PURPOSES ONLY.  IF CONFIRMATION IS NEEDED FOR ANY PURPOSE, NOTIFY LAB WITHIN 5 DAYS. LOWEST DETECTABLE LIMITS FOR URINE DRUG SCREEN Drug Class                     Cutoff (ng/mL) Amphetamine and metabolites     1000 Barbiturate and metabolites    200 Benzodiazepine                 200 Tricyclics and metabolites     300 Opiates and metabolites        300 Cocaine and metabolites        300 THC                            50 Performed at [facility] Lab, [address], [place], [place] [address]   Urinalysis, Routine w reflex microscopic     Status: Abnormal   Collection Time: 02/10/19  9:39 PM  Result Value Ref Range   Color, Urine YELLOW YELLOW   APPearance CLEAR CLEAR   Specific Gravity, Urine 1.015 1.005 - 1.030   pH 5.0 5.0 - 8.0   Glucose, UA NEGATIVE NEGATIVE mg/dL   Hgb urine dipstick NEGATIVE NEGATIVE   Bilirubin Urine NEGATIVE NEGATIVE   Ketones, ur 80 (A) NEGATIVE mg/dL   Protein, ur NEGATIVE NEGATIVE mg/dL   Nitrite NEGATIVE NEGATIVE   Leukocytes,Ua NEGATIVE NEGATIVE    Comment: Performed at [facility], [address] [place], [place] [address]  CBC     Status: None   Collection Time: 02/10/19 10:31 PM  Result Value Ref Range   WBC 9.7 4.0 - 10.5 K/uL   RBC 4.43 4.22 - 5.81 MIL/uL   Hemoglobin 13.1 13.0 - 17.0 g/dL   HCT 86.5 78.4 - 69.6 %   MCV 89.4 80.0 - 100.0 fL   MCH 29.6 26.0 - 34.0 pg   MCHC 33.1 30.0 - 36.0 g/dL   RDW 29.5 28.4 - 13.2 %   Platelets 274 150 - 400 K/uL   nRBC 0.0 0.0 - 0.2 %    Comment: Performed at [facility] Lab, [address] [place], [place] [address]  Lactic acid, plasma     Status: None   Collection Time: 02/10/19 10:31 PM  Result Value Ref Range   Lactic Acid, Venous 0.8 0.5 - 1.9 mmol/L    Comment: Performed at [facility] Lab, [address], [place], [place] [address]  Osmolality     Status: None   Collection Time: 02/10/19 10:31 PM  Result Value Ref Range   Osmolality 280 275 - 295 mOsm/kg    Comment: Performed at [facility] Lab, [address] [place], [place] [address]  Basic metabolic panel     Status: Abnormal   Collection Time: 02/10/19 11:07 PM  Result Value Ref Range  Sodium 134 (L) 135 - 145  mmol/L   Potassium 3.6 3.5 - 5.1 mmol/L   Chloride 97 (L) 98 - 111 mmol/L   CO2 22 22 - 32 mmol/L   Glucose, Bld 88 70 - 99 mg/dL   BUN 12 6 - 20 mg/dL   Creatinine, Ser 9.67 0.61 - 1.24 mg/dL   Calcium 9.0 8.9 - 89.3 mg/dL   GFR calc non Af Amer >60 >60 mL/min   GFR calc Af Amer >60 >60 mL/min   Anion gap 15 5 - 15    Comment: Performed at [facility] Lab, [address], [place], [place] [address]  Phosphorus     Status: None   Collection Time: 02/11/19  1:04 AM  Result Value Ref Range   Phosphorus 3.2 2.5 - 4.6 mg/dL    Comment: Performed at [facility], [address] [place], [place] [address]  Lactic acid, plasma     Status: None   Collection Time: 02/11/19  1:04 AM  Result Value Ref Range   Lactic Acid, Venous 0.7 0.5 - 1.9 mmol/L    Comment: Performed at [facility] Lab, [address] [place], [place] [address]  Basic metabolic panel     Status: Abnormal   Collection Time: 02/11/19  6:36 AM  Result Value Ref Range   Sodium 137 135 - 145 mmol/L   Potassium 3.6 3.5 - 5.1 mmol/L   Chloride 101 98 - 111 mmol/L   CO2 20 (L) 22 - 32 mmol/L   Glucose, Bld 98 70 - 99 mg/dL   BUN 11 6 - 20 mg/dL   Creatinine, Ser 8.24 0.61 - 1.24 mg/dL   Calcium 8.6 (L) 8.9 - 10.3 mg/dL   GFR calc non Af Amer >60 >60 mL/min   GFR calc Af Amer >60 >60 mL/min   Anion gap 16 (H) 5 - 15    Comment: Performed at [facility], [address] [place], [place] [address]    Current Facility-Administered Medications  Medication Dose Route Frequency Provider Last Rate Last Dose  . acetaminophen (TYLENOL) tablet 650 mg  650 mg Oral Q6H PRN Beola Cord, MD       Or  . acetaminophen (TYLENOL) suppository 650 mg  650 mg Rectal Q6H PRN Beola Cord, MD      . albuterol (PROVENTIL) (2.5 MG/3ML) 0.083% nebulizer solution 2.5 mg  2.5 mg Nebulization Q6H PRN Anne Shutter, MD      . ALPRAZolam Prudy Feeler) tablet 0.5 mg  0.5 mg Oral QID Beola Cord, MD   0.5 mg at  02/10/19 2252  . atorvastatin (LIPITOR) tablet 20 mg  20 mg Oral Daily Beola Cord, MD   20 mg at 02/10/19 2252  . folic acid (FOLVITE) tablet 1 mg  1 mg Oral Daily Beola Cord, MD      . gabapentin (NEURONTIN) capsule 300 mg  300 mg Oral TID Beola Cord, MD   300 mg at 02/10/19 2252  . heparin injection 5,000 Units  5,000 Units Subcutaneous Q8H Beola Cord, MD   5,000 Units at 02/11/19 0525  . LORazepam (ATIVAN) tablet 1 mg  1 mg Oral Q6H PRN Beola Cord, MD       Or  . LORazepam (ATIVAN) injection 1 mg  1 mg Intravenous Q6H PRN Beola Cord, MD   1 mg at 02/11/19 0525  . multivitamin with minerals tablet 1 tablet  1 tablet Oral Daily Beola Cord, MD      .  senna-docusate (Senokot-S) tablet 1 tablet  1 tablet Oral QHS PRN Beola Cord, MD      . sodium chloride flush (NS) 0.9 % injection 3 mL  3 mL Intravenous Q12H Beola Cord, MD      . thiamine (VITAMIN B-1) tablet 100 mg  100 mg Oral Daily Beola Cord, MD       Or  . thiamine (B-1) injection 100 mg  100 mg Intravenous Daily Beola Cord, MD        Musculoskeletal: Strength & Muscle Tone: within normal limits Gait & Station: UTA since patient is lying in bed. Patient leans: N/A  Psychiatric Specialty Exam: Physical Exam  Nursing note and vitals reviewed. Constitutional: He is oriented to person, place, and time. He appears well-developed and well-nourished.  HENT:  Head: Normocephalic and atraumatic.  Neck: Normal range of motion.  Respiratory: Effort normal.  Musculoskeletal: Normal range of motion.  Neurological: He is alert and oriented to person, place, and time.  Psychiatric: His speech is normal and behavior is normal. Judgment normal. Cognition and memory are normal. He exhibits a depressed mood. He expresses suicidal ideation. He expresses suicidal plans.    Review of Systems  Cardiovascular: Negative for chest pain.  Gastrointestinal: Positive for  constipation and nausea. Negative for abdominal pain, diarrhea and vomiting.  Psychiatric/Behavioral: Positive for depression, substance abuse and suicidal ideas. Negative for hallucinations. The patient does not have insomnia.   All other systems reviewed and are negative.   Blood pressure 127/84, pulse 86, temperature 98.1 F (36.7 C), temperature source Oral, resp. rate 18, height 5\' 9"  (1.753 m), weight 92.5 kg, SpO2 95 %.Body mass index is 30.13 kg/m.  General Appearance: Fairly Groomed, middle aged, Caucasian male with unshaved face who is lying in bed. NAD.   Eye Contact:  Good  Speech:  Clear and Coherent and Normal Rate  Volume:  Normal  Mood:  Depressed  Affect:  Constricted  Thought Process:  Goal Directed, Linear and Descriptions of Associations: Intact  Orientation:  Full (Time, Place, and Person)  Thought Content:  Logical  Suicidal Thoughts:  Yes.  with intent/plan  Homicidal Thoughts:  No  Memory:  Immediate;   Good Recent;   Good Remote;   Good  Judgement:  Fair  Insight:  Fair  Psychomotor Activity:  Normal  Concentration:  Concentration: Good and Attention Span: Good  Recall:  Good  Fund of Knowledge:  Good  Language:  Good  Akathisia:  No  Handed:  Right  AIMS (if indicated):   N/A  Assets:  Communication Skills Desire for Improvement Financial Resources/Insurance Leisure Time Resilience  ADL's:  Intact  Cognition:  WNL  Sleep:   Okay   Assessment:  Jason Bean is a 58 y.o. male who was admitted with alcohol withdrawal. Patient endorses depressive symptoms in the setting of multiple psychosocial stressors including lack of social support and alcohol abuse. He endorses SI with a plan to overdose or alcohol intoxication. He warrants inpatient psychiatric hospitalization for stabilization and treatment. Recommend restarting home Lexapro for depression.   Treatment Plan Summary: -Patient warrants inpatient psychiatric hospitalization given high risk of  harm to self. -Continue Software engineer.  -Restart home Lexapro 10 mg daily for depression.  -Please pursue involuntary commitment if patient refuses voluntary psychiatric hospitalization or attempts to leave the hospital.  -Will sign off on patient at this time. Please consult psychiatry again as needed.     Disposition: Recommend psychiatric Inpatient admission when medically cleared.  Cherly Beach, DO 02/11/2019 9:14 AM

## 2019-02-11 NOTE — Plan of Care (Signed)

## 2019-02-11 NOTE — Progress Notes (Signed)
  Date: 02/11/2019  Patient name: Jason Bean  Medical record number: [id number]  Date of birth: 03-06-1961   I have seen and evaluated this patient and I have discussed the plan of care with the house staff. Please see their note for complete details. I concur with their findings with the following additions/corrections:   Please see my separate attestation of the H&P from 02/11/2019.  We have heard from our psychiatry colleagues, and they are recommending inpatient psychiatric hospitalization as soon as he is medically stabilized.  Jessy Oto, M.D., Ph.D. 02/11/2019, 2:30 PM

## 2019-02-11 NOTE — Progress Notes (Signed)
Patient received from ED to 6N18.  Alert and oriented x4.  Vital signs stable. Skin intact. Denies c/o pain. Oriented to room and control. Suicide precaution in  place, sitter at bedside.

## 2019-02-11 NOTE — Plan of Care (Signed)

## 2019-02-12 ENCOUNTER — Inpatient Hospital Stay (HOSPITAL_COMMUNITY): Payer: Medicare Other

## 2019-02-12 DIAGNOSIS — M79661 Pain in right lower leg: Secondary | ICD-10-CM

## 2019-02-12 MED ORDER — KETOROLAC TROMETHAMINE 15 MG/ML IJ SOLN
15.0000 mg | Freq: Four times a day (QID) | INTRAMUSCULAR | Status: AC | PRN
  Administered 2019-02-12 – 2019-02-13 (×4): 15 mg via INTRAVENOUS
  Filled 2019-02-12 (×4): qty 1

## 2019-02-12 MED ORDER — IPRATROPIUM-ALBUTEROL 0.5-2.5 (3) MG/3ML IN SOLN
3.0000 mL | Freq: Four times a day (QID) | RESPIRATORY_TRACT | Status: DC
  Administered 2019-02-12: 3 mL via RESPIRATORY_TRACT
  Filled 2019-02-12: qty 3

## 2019-02-12 MED ORDER — ENOXAPARIN SODIUM 40 MG/0.4ML ~~LOC~~ SOLN
40.0000 mg | SUBCUTANEOUS | Status: DC
  Administered 2019-02-12 – 2019-02-14 (×3): 40 mg via SUBCUTANEOUS
  Filled 2019-02-12 (×3): qty 0.4

## 2019-02-12 MED ORDER — IPRATROPIUM-ALBUTEROL 0.5-2.5 (3) MG/3ML IN SOLN
3.0000 mL | Freq: Two times a day (BID) | RESPIRATORY_TRACT | Status: DC
  Administered 2019-02-13 – 2019-02-14 (×3): 3 mL via RESPIRATORY_TRACT
  Filled 2019-02-12 (×4): qty 3

## 2019-02-12 NOTE — Progress Notes (Signed)
   Subjective: Feels well this morning. Sitting up in bed eating breakfast. Endorses some cough. Met with psychiatry yesterday, agreeable to inpatient psychiatric admission for his depression.   Objective:  Vital signs in last 24 hours: Vitals:   02/11/19 0518 02/11/19 1428 02/11/19 2029 02/12/19 0605  BP: 127/84 117/73 111/73 117/87  Pulse: 86 (!) 101 87 82  Resp: '18 18 18 18  '$ Temp: 98.1 F (36.7 C) 98.2 F (36.8 C) 98.6 F (37 C) 97.9 F (36.6 C)  TempSrc: Oral Oral Oral Oral  SpO2: 95% 94% 98% 94%  Weight:      Height:       Physical Exam Constitutional: NAD, appears comfortable Cardiovascular: RRR, no murmurs, rubs, or gallops.  Pulmonary/Chest: Bilateral rales, normal effort Abdominal: Soft, non tender, non distended. +BS.  Extremities: Warm and well perfused. Distal pulses intact. No edema.  Neurological: A&Ox3, CN II - XII grossly intact.  Skin: No rashes or erythema  Psychiatric: Normal mood and affect   Assessment/Plan:  Principal Problem:   Substance induced mood disorder (HCC) Active Problems:   Hypertension   Alcohol dependence ([place])   Severe recurrent major depression without psychotic features (HCC)   Alcoholic ketoacidosis   Alcohol withdrawal (HCC)  Alcohol Use Disorder: Patient presented with tremors, nausea, and diaphoresis after running out of alcohol. Has a history of withdrawal seizures in the past. Admitted and placed on CIWA. Restarted on home xanax. Doing well, CIWA scores have consistently been less than 10 over the past 24 hours. Received 1 dose of ativan prn for CIWA score of 9 at 1am.  -- Continue CIWA -- Stop PRN ativan; please page MD for CIWA score > 10  -- Continue home xanax 0.4 mg QID -- Encourage cessation  -- Continue thiamine, multivitamin   Depression with SI: Appreciate psychiatry consulted. Recommended inpatient psych admission. IVC is patient attempts to leave. He is currently agreeable to admission. Started lexapro, continue  suicide precautions, sitter at bedside.    COPD: Bilateral rales today with cough -- Schedule duonebs q6h while awake  -- Continue albuterol inhaler prn   Right Lower Extremity Pain: With bony deformity over tib/fib. Reports recent injury requiring surgery. No records in Epic or care everywhere. Will obtain plain films to better evaluation.  -- Right tib/fib xray   FEN: none , replete lytes prn, regular diet VTE ppx: subq heparin changed to Lovenox  Code Status: Changed to FULL code. Listed DNR on admission, but since patient is actively suicidal he does not have capacity to made code status decisions.   Dispo: Anticipate patient will be medically clear for discharge if CIWA scores remain < 10 and he does not require any prn ativan over the next 24 hours. Social work consult placed.   Velna Ochs, MD 02/12/2019, 9:16 AM Pager: [phone]

## 2019-02-12 NOTE — Progress Notes (Signed)
  Date: 02/12/2019  Patient name: Jason Bean  Medical record number: [id number]  Date of birth: 1961-06-29   I have seen and evaluated this patient and I have discussed the plan of care with the house staff. Please see their note for complete details.  Jessy Oto, M.D., Ph.D. 02/12/2019, 2:03 PM

## 2019-02-13 LAB — CBC
HCT: 39 % (ref 39.0–52.0)
Hemoglobin: 13 g/dL (ref 13.0–17.0)
MCH: 30.4 pg (ref 26.0–34.0)
MCHC: 33.3 g/dL (ref 30.0–36.0)
MCV: 91.3 fL (ref 80.0–100.0)
Platelets: 252 10*3/uL (ref 150–400)
RBC: 4.27 MIL/uL (ref 4.22–5.81)
RDW: 12.4 % (ref 11.5–15.5)
WBC: 6.1 10*3/uL (ref 4.0–10.5)
nRBC: 0 % (ref 0.0–0.2)

## 2019-02-13 LAB — BASIC METABOLIC PANEL
Anion gap: 8 (ref 5–15)
BUN: 12 mg/dL (ref 6–20)
CO2: 26 mmol/L (ref 22–32)
Calcium: 8.6 mg/dL — ABNORMAL LOW (ref 8.9–10.3)
Chloride: 103 mmol/L (ref 98–111)
Creatinine, Ser: 0.72 mg/dL (ref 0.61–1.24)
GFR calc Af Amer: >60 mL/min (ref >60)
GFR calc non Af Amer: >60 mL/min (ref >60)
Glucose, Bld: 106 mg/dL — ABNORMAL HIGH (ref 70–99)
Potassium: 3.9 mmol/L (ref 3.5–5.1)
Sodium: 137 mmol/L (ref 135–145)

## 2019-02-13 MED ORDER — ONDANSETRON 4 MG PO TBDP
8.0000 mg | ORAL_TABLET | Freq: Three times a day (TID) | ORAL | Status: DC | PRN
  Administered 2019-02-13: 8 mg via ORAL
  Administered 2019-02-13: 4 mg via ORAL
  Filled 2019-02-13 (×2): qty 2

## 2019-02-13 NOTE — Discharge Summary (Signed)
Name: Jason Bean MRN: [id number] DOB: [birth year] 58 y.o. PCP: Patient, No Pcp Per  Date of Admission: 02/10/2019  5:21 PM Date of Discharge: 02/14/19 Attending Physician: Anne Shutter, MD  Discharge Diagnosis: 1. Alcohol use disorder 2. MDD with SI 3. COPD 4. Right LE pain 2/2 recent fracture s/p IM nail placement  Discharge Medications: Allergies as of 02/14/2019   No Known Allergies     Medication List    STOP taking these medications   amitriptyline 25 MG tablet Commonly known as:  ELAVIL   mirtazapine 15 MG tablet Commonly known as:  REMERON   traZODone 50 MG tablet Commonly known as:  DESYREL   venlafaxine XR 75 MG 24 hr capsule Commonly known as:  EFFEXOR-XR     TAKE these medications   albuterol 108 (90 Base) MCG/ACT inhaler Commonly known as:  PROVENTIL HFA;VENTOLIN HFA Inhale 2 puffs into the lungs every 6 (six) hours as needed for wheezing or shortness of breath.   ALPRAZolam 0.5 MG tablet Commonly known as:  XANAX Take 0.5 mg by mouth 4 (four) times daily.   atorvastatin 20 MG tablet Commonly known as:  LIPITOR Take 20 mg by mouth daily.   carbamazepine 100 MG chewable tablet Commonly known as:  TEGRETOL Chew 100 mg by mouth 3 (three) times daily.   escitalopram 10 MG tablet Commonly known as:  LEXAPRO Take 10 mg by mouth daily.   folic acid 1 MG tablet Commonly known as:  FOLVITE Take 1 tablet (1 mg total) by mouth daily. Start taking on:  February 15, 2019   gabapentin 300 MG capsule Commonly known as:  NEURONTIN Take 300 mg by mouth 3 (three) times daily.   multivitamin with minerals Tabs tablet Take 1 tablet by mouth daily. Start taking on:  February 15, 2019   ondansetron 8 MG disintegrating tablet Commonly known as:  ZOFRAN-ODT Take 1 tablet (8 mg total) by mouth every 8 (eight) hours as needed for nausea or vomiting.   thiamine 100 MG tablet Take 1 tablet (100 mg total) by mouth daily. Start taking on:  February 15, 2019       Disposition and follow-up:   Mr.Aidden W Rotenberg was discharged from [facility] in Stable condition.  At the hospital follow up visit please address:  1. EtOH use: please encourage cessation MDD with SI: D/c'd to psychiatric hospital Patient is supposed to be on lexapro, elavil, gabapentin, remeron, trazodone, effexor, and tegretol, he reported that his last dose was 1 week PTA however it was unclear which medications he was taking. We continued his lexapro and tegretol on discharge. Please assess need to restart other medications.  2.  Labs / imaging needed at time of follow-up: CBC, BMP  3.  Pending labs/ test needing follow-up: None  Follow-up Appointments:   Hospital Course by problem list: 1. Alcohol use disorder:  This is a 58 year old male with a history of EtOH use disorder, EtOH withdrawal and seizures, COPD, depression, and HTN who presented with diaphoresis, nausea and tremors. Last drink was 1 day PTA. He had broken his foot a few weeks earlier and had been discharged from the hospital to a SNF, He was not getting his alprazolam while he was there and was getting frustrated so he left and then resumed drinking heavily. He started feeling agitated, sweaty, nauseated, and started having tremors and he was concerned about alcohol withdrawal seizures. On arrival he was noted to be tachycardic,  hypertensive, tremulous, anxious, agitated, and diaphoretic, he was also noted to had a depressed mood and SI. CIWA of 19, he was given 2 mg PO ativan with improvement. His labs were significant for elevated AG of 23, EtOH 21, leukocytosis, and normal acetaminophen and salicylate. He was given some fluids, thiamine, multivitamins, folic acid, and placed on CIWA protocol with ativan. His CIWA scores improved and was able to be transitioned off ativan. He had improvement in his metabolic derangements during his hospitalization. Patient was advised to quit drinking EtOH on  discharge. Discharged to inpatient psych.   2. MDD with SI: Patient reported that he had suicidal ideations on admission, reporting that he wanted to take multiple pills to end his life but ran out of medications. He has a difficult social situation, would be unable to life with his friend and would be un-homed soon. He was noted to have a depressed mood. He was given a Comptroller and psychiatry was consulted. He had not been taking his medications for about 2 weeks. Psych recommended starting home lexapro and inpatient psychiatric hospitilization. Patient was discharged to inpatient psych.   3. COPD: Treated with albuterol inhalers and dulera.   4. Right LE pain 2/2 recent fracture s/p IM nail placement: Patient had procedure in [place], he was discharged to a SNF where he spent about 3 weeks. He reported some pain in his ankle and this was treated with PRN toradol. X-ray showed no acute findings with hardware fixation in place. Patient was getting discharged to inpatient rehab and needed a boot that had   Discharge Vitals:   BP 118/72 (BP Location: Right Arm)   Pulse 73   Temp 98 F (36.7 C) (Oral)   Resp 18   Ht 5\' 9"  (1.753 m)   Wt 92.5 kg   SpO2 97%   BMI 30.13 kg/m   Pertinent Labs, Studies, and Procedures:  CBC Latest Ref Rng & Units 02/13/2019 02/10/2019 02/10/2019  WBC 4.0 - 10.5 K/uL 6.1 9.7 12.2(H)  Hemoglobin 13.0 - 17.0 g/dL 95.2 84.1 32.4  Hematocrit 39.0 - 52.0 % 39.0 39.6 45.5  Platelets 150 - 400 K/uL 252 274 329   BMP Latest Ref Rng & Units 02/13/2019 02/11/2019 02/10/2019  Glucose 70 - 99 mg/dL 401(U) 98 88  BUN 6 - 20 mg/dL 12 11 12   Creatinine 0.61 - 1.24 mg/dL 2.72 5.36 6.44  Sodium 135 - 145 mmol/L 137 137 134(L)  Potassium 3.5 - 5.1 mmol/L 3.9 3.6 3.6  Chloride 98 - 111 mmol/L 103 101 97(L)  CO2 22 - 32 mmol/L 26 20(L) 22  Calcium 8.9 - 10.3 mg/dL 0.3(K) 7.4(Q) 9.0     Discharge Instructions: Discharge Instructions    Call MD for:  difficulty breathing,  headache or visual disturbances   Complete by:  As directed    Call MD for:  extreme fatigue   Complete by:  As directed    Call MD for:  hives   Complete by:  As directed    Call MD for:  persistant dizziness or light-headedness   Complete by:  As directed    Call MD for:  persistant nausea and vomiting   Complete by:  As directed    Call MD for:  redness, tenderness, or signs of infection (pain, swelling, redness, odor or green/yellow discharge around incision site)   Complete by:  As directed    Call MD for:  severe uncontrolled pain   Complete by:  As directed  Call MD for:  temperature >100.4   Complete by:  As directed    Diet - low sodium heart healthy   Complete by:  As directed    Discharge instructions   Complete by:  As directed    Beryle QuantJames W Gebbia,   It has been a pleasure working with you and we are glad you're feeling better. 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 I am sorry that you have been feeling depressed, I hope that you start feeling better.    Please start taking multivitamins, folic acid, and thiamine. Please try to quit drinking alcohol, it is harming your health and can be very dangerous given your history of withdrawal.    If your symptoms worsen or you develop new symptoms, please seek medical help whether it is your primary care provider or emergency department.   Increase activity slowly   Complete by:  As directed       Signed: Claudean SeveranceKrienke, Marissa M, MD 02/14/2019, 2:08 PM   Pager: [phone]

## 2019-02-13 NOTE — Progress Notes (Signed)
   Subjective: Jason Bean reports that he is feeling a little better today but is still feeling down. No acute events overnight. He does report that he is still having some nausea, denies any vomiting. He reports that he had his surgery a few weeks ago, we discussed the results of the x-ray. We discussed the plan for today and he is in agreement.   Objective:  Vital signs in last 24 hours: Vitals:   02/12/19 1317 02/12/19 1825 02/12/19 2032 02/13/19 0615  BP:  129/83 128/81 124/83  Pulse:  77 74 67  Resp:  18 16   Temp:  98.6 F (37 C) 98.3 F (36.8 C) 98.1 F (36.7 C)  TempSrc:  Oral Oral Oral  SpO2: 94% 94% 95% 94%  Weight:      Height:        General: Middle aged male, NAD, sitting comfortably in bed Cardiac: RRR, systolic murmur Pulmonary: Coarse breath sounds bilaterally, normal work of breathing Abdomen: Soft, non-tender, non-distended, normoactive bowel sounds Extremity:Right ankle edematous, good pulses and warm extremities  Assessment/Plan:  Principal Problem:   Substance induced mood disorder (HCC) Active Problems:   Hypertension   Alcohol dependence (HCC)   Severe recurrent major depression without psychotic features (HCC)   Alcoholic ketoacidosis   Alcohol withdrawal (HCC)  This is a 58 year old male with a history of EtOH use disorder, EtOH withdrawal and seizures, COPD, depression, and HTN who presented with diaphoresis, nausea and tremors. Last drink was 4 AM 3/19.   Alcohol use disorder: -Ativan was discontinued yesterday due to low CIWA scores, he has had no issues overnight and his most recent CIWAs have been 0. He appears to be outside the window of withdrawal and given his low CIWA scores there is a low risk of symptoms recurring. Today he reports that he is feeling a little better however still is feeling down. He has still been having some nausea, no vomiting. We discussed that he should be out of the withdrawal window and should be able to go to  inpatient psych once a bed is available.  -Continue CIWA, please page MD for CIAA >10 -Continue home Xanax 0.4 mg QID -Encourage EtOH cessation -Continue thiamine, folic acid and multivitamins -Add phenergan PRN  MDD with SI: -Psychiatry consulted, recommended inpatient psychiatric hospitalization. Patient reports that he is still feeling down, has a depressed mood on exam.  -Consulted CSW yesterday -Medically stable for discharge -Continue bedside sitter -Continue lexapro 10 mg daily  COPD: -Albuterol PRN  Right lower extremity pain: -X-ray showed no acute findings, fixation hardware in place. He reports that he is able to walk on it with no issues.  -Continue toradol PRN for pain control  Systolic murmur: -He reported that he has had this for years, echocardiogram in 2004 showed EF 55-65%, aortic valve mildly calcified, findings consistent with aortic valve stenosis, mild aortic valve regurgitation, and mild mitral valve regurgitation. May consider repeating an echocardiogram outpatient.    FEN: No fluids, replete lytes prn, regular diet  VTE ppx: Lovenox  Code Status: FULL (listed DNR on admission but given his SI he does not have capacity to make code status decisions)  Dispo: Anticipated discharge is pending inpatient psychiatry placement.   Claudean Severance, MD 02/13/2019, 7:05 AM Pager: [phone]

## 2019-02-13 NOTE — Progress Notes (Signed)
  Date: 02/13/2019  Patient name: Jason Bean  Medical record number: [id number]  Date of birth: [birth year]   I reviewed the medical history and physical examination as documented by the resident physician.I concur with the assessment and plan as documented. I was immediately available to the resident for questions and consultation.  Jessy Oto, M.D., Ph.D. 02/13/2019, 3:11 PM

## 2019-02-14 ENCOUNTER — Other Ambulatory Visit: Payer: Self-pay

## 2019-02-14 ENCOUNTER — Other Ambulatory Visit: Payer: Self-pay | Admitting: Family

## 2019-02-14 ENCOUNTER — Inpatient Hospital Stay (HOSPITAL_COMMUNITY)
Admission: AD | Admit: 2019-02-14 | Discharge: 2019-02-17 | DRG: 885 | Disposition: A | Discharge status: Home / Self Care | Payer: Medicare Other | Source: Intra-hospital | Attending: Psychiatry | Admitting: Psychiatry

## 2019-02-14 ENCOUNTER — Encounter (HOSPITAL_COMMUNITY): Payer: Self-pay | Admitting: *Deleted

## 2019-02-14 DIAGNOSIS — I10 Essential (primary) hypertension: Secondary | ICD-10-CM | POA: Diagnosis present

## 2019-02-14 DIAGNOSIS — Z79899 Other long term (current) drug therapy: Secondary | ICD-10-CM

## 2019-02-14 DIAGNOSIS — G4700 Insomnia, unspecified: Secondary | ICD-10-CM | POA: Diagnosis present

## 2019-02-14 DIAGNOSIS — R45851 Suicidal ideations: Secondary | ICD-10-CM | POA: Diagnosis present

## 2019-02-14 DIAGNOSIS — E872 Acidosis: Secondary | ICD-10-CM | POA: Diagnosis not present

## 2019-02-14 DIAGNOSIS — F410 Panic disorder [episodic paroxysmal anxiety] without agoraphobia: Secondary | ICD-10-CM | POA: Diagnosis present

## 2019-02-14 DIAGNOSIS — E785 Hyperlipidemia, unspecified: Secondary | ICD-10-CM | POA: Diagnosis present

## 2019-02-14 DIAGNOSIS — F10930 Alcohol use, unspecified with withdrawal, uncomplicated: Secondary | ICD-10-CM

## 2019-02-14 DIAGNOSIS — F10230 Alcohol dependence with withdrawal, uncomplicated: Secondary | ICD-10-CM | POA: Diagnosis present

## 2019-02-14 DIAGNOSIS — F339 Major depressive disorder, recurrent, unspecified: Secondary | ICD-10-CM | POA: Diagnosis present

## 2019-02-14 DIAGNOSIS — Z590 Homelessness: Secondary | ICD-10-CM

## 2019-02-14 DIAGNOSIS — F1994 Other psychoactive substance use, unspecified with psychoactive substance-induced mood disorder: Secondary | ICD-10-CM | POA: Diagnosis present

## 2019-02-14 DIAGNOSIS — F419 Anxiety disorder, unspecified: Secondary | ICD-10-CM | POA: Diagnosis present

## 2019-02-14 DIAGNOSIS — M542 Cervicalgia: Secondary | ICD-10-CM

## 2019-02-14 DIAGNOSIS — F17210 Nicotine dependence, cigarettes, uncomplicated: Secondary | ICD-10-CM | POA: Diagnosis present

## 2019-02-14 DIAGNOSIS — F332 Major depressive disorder, recurrent severe without psychotic features: Secondary | ICD-10-CM | POA: Diagnosis not present

## 2019-02-14 DIAGNOSIS — J449 Chronic obstructive pulmonary disease, unspecified: Secondary | ICD-10-CM | POA: Diagnosis present

## 2019-02-14 DIAGNOSIS — F10239 Alcohol dependence with withdrawal, unspecified: Secondary | ICD-10-CM | POA: Diagnosis not present

## 2019-02-14 DIAGNOSIS — G4089 Other seizures: Secondary | ICD-10-CM | POA: Diagnosis present

## 2019-02-14 DIAGNOSIS — F1024 Alcohol dependence with alcohol-induced mood disorder: Secondary | ICD-10-CM | POA: Diagnosis not present

## 2019-02-14 DIAGNOSIS — I700 Atherosclerosis of aorta: Secondary | ICD-10-CM

## 2019-02-14 DIAGNOSIS — G8929 Other chronic pain: Secondary | ICD-10-CM | POA: Diagnosis present

## 2019-02-14 MED ORDER — HYDROXYZINE HCL 25 MG PO TABS
25.0000 mg | ORAL_TABLET | Freq: Three times a day (TID) | ORAL | Status: DC | PRN
  Administered 2019-02-14 – 2019-02-15 (×3): 25 mg via ORAL
  Filled 2019-02-14 (×3): qty 1

## 2019-02-14 MED ORDER — LOPERAMIDE HCL 2 MG PO CAPS: 2.0000 mg | ORAL_CAPSULE | ORAL | Status: DC | PRN

## 2019-02-14 MED ORDER — VITAMIN B-1 100 MG PO TABS
100.0000 mg | ORAL_TABLET | Freq: Every day | ORAL | Status: DC
  Administered 2019-02-15 – 2019-02-17 (×3): 100 mg via ORAL
  Filled 2019-02-14 (×4): qty 1

## 2019-02-14 MED ORDER — KETOROLAC TROMETHAMINE 10 MG PO TABS
10.0000 mg | ORAL_TABLET | Freq: Once | ORAL | Status: AC
  Administered 2019-02-14: 10 mg via ORAL
  Filled 2019-02-14: qty 1

## 2019-02-14 MED ORDER — TRAZODONE HCL 50 MG PO TABS
50.0000 mg | ORAL_TABLET | Freq: Every evening | ORAL | Status: DC | PRN
  Administered 2019-02-14: 50 mg via ORAL
  Filled 2019-02-14 (×2): qty 1

## 2019-02-14 MED ORDER — CARBAMAZEPINE 100 MG PO CHEW
100.0000 mg | CHEWABLE_TABLET | Freq: Three times a day (TID) | ORAL | Status: DC
  Administered 2019-02-14 – 2019-02-17 (×8): 100 mg via ORAL
  Filled 2019-02-14 (×13): qty 1

## 2019-02-14 MED ORDER — LORAZEPAM 1 MG PO TABS: 1.0000 mg | ORAL_TABLET | Freq: Four times a day (QID) | ORAL | Status: DC | PRN

## 2019-02-14 MED ORDER — MAGNESIUM HYDROXIDE 400 MG/5ML PO SUSP: 30.0000 mL | Freq: Every day | ORAL | Status: DC | PRN

## 2019-02-14 MED ORDER — ONDANSETRON 4 MG PO TBDP: 8.0000 mg | ORAL_TABLET | Freq: Three times a day (TID) | ORAL | Status: DC | PRN

## 2019-02-14 MED ORDER — ADULT MULTIVITAMIN W/MINERALS CH
1.0000 | ORAL_TABLET | Freq: Every day | ORAL | Status: DC
  Administered 2019-02-15 – 2019-02-17 (×3): 1 via ORAL
  Filled 2019-02-14 (×4): qty 1

## 2019-02-14 MED ORDER — ACETAMINOPHEN 325 MG PO TABS
650.0000 mg | ORAL_TABLET | Freq: Four times a day (QID) | ORAL | Status: DC | PRN
  Administered 2019-02-14 – 2019-02-16 (×2): 650 mg via ORAL
  Filled 2019-02-14 (×3): qty 2

## 2019-02-14 MED ORDER — ALBUTEROL SULFATE HFA 108 (90 BASE) MCG/ACT IN AERS: 2.0000 | INHALATION_SPRAY | Freq: Four times a day (QID) | RESPIRATORY_TRACT | Status: DC | PRN

## 2019-02-14 MED ORDER — ADULT MULTIVITAMIN W/MINERALS CH: 1.0000 | ORAL_TABLET | Freq: Every day | ORAL | 0 refills | Status: AC

## 2019-02-14 MED ORDER — THIAMINE HCL 100 MG PO TABS: 100.0000 mg | ORAL_TABLET | Freq: Every day | ORAL | 0 refills | Status: AC

## 2019-02-14 MED ORDER — GABAPENTIN 300 MG PO CAPS
300.0000 mg | ORAL_CAPSULE | Freq: Three times a day (TID) | ORAL | Status: DC
  Administered 2019-02-14 – 2019-02-17 (×8): 300 mg via ORAL
  Filled 2019-02-14 (×13): qty 1

## 2019-02-14 MED ORDER — ALUM & MAG HYDROXIDE-SIMETH 200-200-20 MG/5ML PO SUSP: 30.0000 mL | ORAL | Status: DC | PRN

## 2019-02-14 MED ORDER — MIRTAZAPINE 7.5 MG PO TABS
7.5000 mg | ORAL_TABLET | Freq: Every day | ORAL | Status: DC
  Administered 2019-02-14 – 2019-02-16 (×2): 7.5 mg via ORAL
  Filled 2019-02-14 (×5): qty 1

## 2019-02-14 MED ORDER — FOLIC ACID 1 MG PO TABS: 1.0000 mg | ORAL_TABLET | Freq: Every day | ORAL | 0 refills | Status: AC

## 2019-02-14 MED ORDER — ONDANSETRON 8 MG PO TBDP: 8.0000 mg | ORAL_TABLET | Freq: Three times a day (TID) | ORAL | 0 refills | Status: AC | PRN

## 2019-02-14 MED ORDER — PROCHLORPERAZINE MALEATE 5 MG PO TABS
5.0000 mg | ORAL_TABLET | Freq: Once | ORAL | Status: AC
  Administered 2019-02-14: 5 mg via ORAL
  Filled 2019-02-14: qty 1

## 2019-02-14 MED ORDER — VENLAFAXINE HCL ER 150 MG PO CP24
150.0000 mg | ORAL_CAPSULE | Freq: Every day | ORAL | Status: DC
  Administered 2019-02-15 – 2019-02-16 (×2): 150 mg via ORAL
  Filled 2019-02-14 (×4): qty 1

## 2019-02-14 MED ORDER — NICOTINE 21 MG/24HR TD PT24
21.0000 mg | MEDICATED_PATCH | Freq: Every day | TRANSDERMAL | Status: DC
  Administered 2019-02-15: 21 mg via TRANSDERMAL
  Filled 2019-02-14 (×4): qty 1

## 2019-02-14 MED ORDER — ATORVASTATIN CALCIUM 20 MG PO TABS
20.0000 mg | ORAL_TABLET | Freq: Every day | ORAL | Status: DC
  Administered 2019-02-15 – 2019-02-17 (×3): 20 mg via ORAL
  Filled 2019-02-14 (×4): qty 1

## 2019-02-14 MED ORDER — FOLIC ACID 1 MG PO TABS
1.0000 mg | ORAL_TABLET | Freq: Every day | ORAL | Status: DC
  Administered 2019-02-15 – 2019-02-17 (×3): 1 mg via ORAL
  Filled 2019-02-14 (×4): qty 1

## 2019-02-14 MED ORDER — ESCITALOPRAM OXALATE 10 MG PO TABS
10.0000 mg | ORAL_TABLET | Freq: Every day | ORAL | Status: DC
  Administered 2019-02-14 – 2019-02-17 (×4): 10 mg via ORAL
  Filled 2019-02-14 (×6): qty 1

## 2019-02-14 NOTE — Social Work (Signed)
CSW placed referral to [facility] for voluntary admission, will update pt.  Octavio Graves, MSW, [facility] Clinical Social Work [phone]

## 2019-02-14 NOTE — Progress Notes (Signed)
Report given to Nehemiah Settle, Charity fundraiser at [employer].

## 2019-02-14 NOTE — Progress Notes (Signed)
Patient admitted to [facility], Right leg/ft boot, using walker and was sitting in wheelchair.  Patient stated he has been taking xanax 0.5 mg four times a day for 30 yrs.  Has been taking klonipin a long time also.  Was taking shots IV for pain.  Taking breathing treatments, medicine for cholesterol.  [place] MD had been giving him xanax for 30 yrs.  Presently patient sitting in dayroom eating dinner.  Respirations even and unlabored.  No signs/symptoms of pain/distress noted on patient's face/body movements.  Safety maintained with 15 minute checks.  Dinner was brought to him by staff.

## 2019-02-14 NOTE — TOC Initial Note (Signed)
Transition of Care [facility]) - Initial/Assessment Note    Patient Details  Name: Jason Bean MRN: [id number] Date of Birth: 19-Nov-1961  Transition [facility] Care [facility]) CM/SW Contact:    Doy Hutching, LCSWA Phone Number: 02/14/2019, 9:56 AM  Clinical Narrative:                 Spoke with pt at bedside, introduced self, role, and reason for visit. Pt states he spoke with Dr. Sharma Covert and is aware he is recommended for inpatient psychiatric support. He is in voluntary agreement for this. Pt has been using walker and boot to mobilize due to previous surgery at [facility]. Pt provided with voluntary form to review. Called Tina at [facility], pt has a bed available for placement. He can utilize the walker that he has but his boot straps must be less than 10 inches for safety purposes. Spoke with MD Dr. Gwyneth Revels and updated her with this information. Requested pt discharge information be completed.   Will request pt complete voluntary form.   Expected Discharge Plan: Psychiatric Hospital Barriers to Discharge: Equipment Delay   Patient Goals and CMS Choice Patient states their goals for this hospitalization and ongoing recovery are:: to have support   Choice offered to / list presented to : Patient  Expected Discharge Plan and Services Expected Discharge Plan: Psychiatric Hospital   Discharge Planning Services: NA Post Acute Care Choice: NA Living arrangements for the past 2 months: Apartment                   Prior Living Arrangements/Services Living arrangements for the past 2 months: Apartment Lives with:: Self Patient language and need for interpreter reviewed:: No Do you feel safe going back to the place where you live?: Yes      Need for Family Participation in Patient Care: No (Comment) Care giver support system in place?: Yes (comment)   Criminal Activity/Legal Involvement Pertinent to Current Situation/Hospitalization: No - Comment as needed  Activities of Daily Living Home  Assistive Devices/Equipment: Walker (specify type) ADL Screening (condition at time of admission) Patient's cognitive ability adequate to safely complete daily activities?: No Is the patient deaf or have difficulty hearing?: No Does the patient have difficulty seeing, even when wearing glasses/contacts?: No Does the patient have difficulty concentrating, remembering, or making decisions?: No Patient able to express need for assistance with ADLs?: Yes Does the patient have difficulty dressing or bathing?: No Independently performs ADLs?: Yes (appropriate for developmental age) Does the patient have difficulty walking or climbing stairs?: No Weakness of Legs: Right Weakness of Arms/Hands: None  Permission Sought/Granted Permission sought to share information with : Family Supports                Emotional Assessment Appearance:: Appears stated age Attitude/Demeanor/Rapport: Engaged, Gracious Affect (typically observed): Adaptable, Accepting, Appropriate, Pleasant Orientation: : Oriented to Self, Oriented to Place, Oriented to  Time, Oriented to Situation Alcohol / Substance Use: Alcohol Use Psych Involvement: Yes (comment)(recommended for inpatient [facility])  Admission diagnosis:  Suicidal ideations [R45.851] Alcohol withdrawal syndrome without complication (HCC) [F10.230] Alcoholic ketoacidosis [E87.2] Patient Active Problem List   Diagnosis Date Noted  . Alcohol withdrawal (HCC) 02/11/2019  . Substance induced mood disorder (HCC)   . Alcoholic ketoacidosis 02/10/2019  . MDD (major depressive disorder), severe (HCC) 11/22/2017  . Alcohol abuse with intoxication (HCC)   . Severe recurrent major depression without psychotic features (HCC) 06/13/2017  . Alcohol dependence (HCC) 11/27/2012  . GERD (gastroesophageal  reflux disease) 02/12/2012  . Hypertension 01/06/2012  . COPD (chronic obstructive pulmonary disease) (HCC) 01/06/2012  . Hyperlipidemia 01/06/2012  . Anxiety 01/06/2012   . Neck pain, chronic 01/06/2012   PCP:  Patient, No Pcp Per Pharmacy:   [facility] - [place], [place] - [address] [place] [address] Phone: [phone] Fax: [phone]     Social Determinants of Health (SDOH) Interventions    Readmission Risk Interventions No flowsheet data found.

## 2019-02-14 NOTE — Progress Notes (Signed)
Jason Bean is a 58 year old male pt admitted on voluntary basis from medical floor. On admission, he reports that he has been feeling depressed and suicidal but is able to contract for safety while in the hospital. He reports that he was living in an ALF and reports that he met a lady there and they went out and were drinking and having a good time and reports that he is unable to go back there. He reports that he was taking his medications as prescribed. He reports that he has no family or supports in the area. He reports that he is on disability and would like assistance in finding a place to go when he gets discharged. Jason Bean is wearing a medical boot on admission and reports that he needs a walker to ambulate. Jason Bean was escorted to the unit via wheelchair, was oriented to the unit and safety maintained.

## 2019-02-14 NOTE — TOC Transition Note (Signed)
Transition of Care [facility]) - CM/SW Discharge Note   Patient Details  Name: Jason Bean MRN: [id number] Date of Birth: 06/03/1961  Transition [facility] Care [facility]) CM/SW Contact:  Doy Hutching, LCSWA Phone Number: 02/14/2019, 2:19 PM   Clinical Narrative:     Pt signed voluntary form, will d/c via [facility] to [facility] at 3:30pm.   Final next level of care: Psychiatric Hospital Barriers to Discharge: Barriers Resolved   Patient Goals and CMS Choice Patient states their goals for this hospitalization and ongoing recovery are:: to have support   Choice offered to / list presented to : Patient  Discharge Placement              Patient chooses bed at: Other - please specify in the comment section below: Patient to be transferred to facility by: [facility] Name of family member notified: pt A&Ox4 Patient and family notified of of transfer: 02/14/19  Discharge Plan and Services   Discharge Planning Services: NA Post Acute Care Choice: NA                    Social Determinants of Health (SDOH) Interventions     Readmission Risk Interventions No flowsheet data found.

## 2019-02-14 NOTE — Progress Notes (Signed)
   Subjective: Mr. Jason Bean reports that he is doing okay today, no acute events overnight. He reports that he is still having some nausea and a decreased appetite and is now reporting a headache. He denies any other concerns. He reported that the breathing treatments yesterday helped. We discussed that we are waiting for a bed placement and that he should be able to go today.   Objective:  Vital signs in last 24 hours: Vitals:   02/13/19 0615 02/13/19 1355 02/13/19 2147 02/14/19 0627  BP: 124/83 131/82 125/85 118/72  Pulse: 67 75 78 73  Resp:   18   Temp: 98.1 F (36.7 C) 98.4 F (36.9 C) 98.4 F (36.9 C) 98 F (36.7 C)  TempSrc: Oral Oral Oral Oral  SpO2: 94% 94% 93% 93%  Weight:      Height:        General: Anxious appearing, laying in bed Cardiac: RRR, systolic murmur Pulmonary: Diffuse coarse breath sounds, normal work of breathing Abdomen: Soft, non-tender, non-distended Extremity:No LE edema Psychiatry: Depressed mood    Assessment/Plan:  Principal Problem:   Substance induced mood disorder (HCC) Active Problems:   Hypertension   Alcohol dependence (HCC)   Severe recurrent major depression without psychotic features (HCC)   Alcoholic ketoacidosis   Alcohol withdrawal (HCC)  This is a 58 year old male with a history of EtOH use disorder, EtOH withdrawal and seizures, COPD, depression, and HTN who presented with diaphoresis, nausea and tremors.Last drink was 4 AM 3/19.   Alcohol use disorder: -Patient has been doing well, he has not required any ativan for >24 hours. He appears to be outside of the window for withdrawal. He is now having some nausea and a headache, denies any other complaints. We discussed that he should be out of the withdrawal window and should be able to go to inpatient psych once a bed is available.Will given some medications for his headache and nausea.  -Discontinue CIWA -Continue home Xanax 0.4 mg QID -Encourage EtOH cessation -Continue  thiamine, folic acid and multivitamins -Continue phenergan PRN -Compazine and toradol x1  MDD with SI: -Psychiatry consulted, recommended inpatient psychiatric hospitalization. Patient reports that he is agreeable to this. CSW consulted and they were able to find a bed today however they need a surgical boot that has straps < 10 inches for safety.  -CSW following, appreciate assistance -Medically stable for discharge -Continue bedside sitter -Continue lexapro 10 mg daily -Consulted orthopedics for other possible boot options  COPD: -Albuterol PRN -Scheduled duonebs  Right lower extremity pain: -X-ray showed no acute findings, fixation hardware in place. He reports that he is able to walk on it with no issues. Had a procedure in [place].  -Continue toradol PRN for pain control -Ortho consulted for boot options that has straps < 10 inches  Systolic murmur: -He reported that he has had this for years, echocardiogram in 2004 showed EF 55-65%, aortic valve mildly calcified, findings consistent with aortic valve stenosis, mild aortic valve regurgitation, and mild mitral valve regurgitation. -Echocardiogram outpatietn  FEN: No fluids, replete lytes prn, regular diet  VTE ppx: Lovenox  Code Status: FULL (listed DNR on admission but given his SI he does not have capacity to make code status decisions)  Dispo: Anticipated discharge in approximately today.   Claudean Severance, MD 02/14/2019, 6:47 AM Pager: [phone]

## 2019-02-14 NOTE — Social Work (Signed)
Clinical Social Worker facilitated patient discharge including contacting patient family and facility to confirm patient discharge plans.  Clinical information faxed to facility and family agreeable with plan.  CSW arranged ambulance transport via [facility] to [facility] at 3:30pm RN to call [phone]  with report prior to discharge. Referring MD Sharma Covert Accepting MD [place] Pt room 303 bed 2  Clinical Social Worker will sign off for now as social work intervention is no longer needed. Please consult [place] again if new need arises.  Octavio Graves, MSW, [facility] Clinical Social Worker [phone]

## 2019-02-14 NOTE — Discharge Instructions (Signed)
Jason Bean,   It has been a pleasure working with you and we are glad you're feeling better. 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 I am sorry that you have been feeling depressed, I hope that you start feeling better.   Please start taking multivitamins, folic acid, and thiamine. Please try to quit drinking alcohol, it is harming your health and can be very dangerous given your history of withdrawal.    If your symptoms worsen or you develop new symptoms, please seek medical help whether it is your primary care provider or emergency department.    Alcohol Withdrawal Syndrome Alcohol withdrawal syndrome is a group of symptoms that can develop when a person who drinks heavily and regularly stops drinking or drinks less. Alcohol withdrawal syndrome can be mild or severe, and it may even be life-threatening. Alcohol withdrawal syndrome usually affects people who have alcohol use disorder, which may also be called alcoholism. Alcohol use disorder is when a person is unable to control his or her alcohol use, and drinking too much or too often causes problems at home, at work, or in relationships. What are the causes? Drinking heavily and drinking on a regular basis cause changes in brain chemistry. Over time, the body becomes dependent on alcohol. When alcohol use stops, the chemistry system in the brain becomes unbalanced and causes the symptoms of alcohol withdrawal. What increases the risk? Alcohol withdrawal syndrome is more likely to occur in people who drink more than the recommended limit of alcohol (2 drinks a day for men or 1 drink a day for non-pregnant women). It is also more likely to affect heavy drinkers who have been using alcohol for long periods of time. The more a person drinks and the longer he or she drinks, the greater the risk of alcohol withdrawal syndrome. Severe withdrawal is more likely to develop in someone who:  Had severe alcohol withdrawal in the  past.  Had a seizure during a previous episode of alcohol withdrawal.  Is elderly.  Uses other drugs.  Has a long-term (chronic) medical problem, such as heart, lung, or liver disease.  Has depression.  Does not get enough nutrients from his or her diet (malnutrition). What are the signs or symptoms? Symptoms of this condition can be mild to moderate, or they can be severe. Symptoms may develop a few hours (or up to a day) after a person changes his or her drinking patterns. During the 48 hours after he or she has stopped drinking, the following symptoms may go away or get better:  Uncontrollable shaking (tremor).  Sweating.  Headache.  Anxiety.  Inability to relax (agitation).  Trouble sleeping (insomnia).  Irregular heartbeats (palpitations).  Alcohol cravings.  Seizure. The following symptoms may get worse 24-48 hours after a person has decreased or stopped alcohol use, and they may gradually improve over a period of days or weeks:  Nausea and vomiting.  Fatigue.  Sensitivity to light and sounds.  Confusion and inability to think clearly.  Loss of appetite.  Mood swings, irritability, depression, and anxiety.  Insomnia and nightmares. The following symptoms are severe and life-threatening. When these symptoms occur together, they are called delirium tremens (DTs):  High blood pressure.  Increased heart rate.  Trouble breathing.  Seizures. These may go away along with other symptoms, or they may persist.  Seeing, hearing, feeling, smelling, or tasting things that are not there (hallucinations). If you experience hallucinations, they usually begin 12-24 hours after a change in  drinking patterns. Delirium tremens requires immediate hospitalization. How is this diagnosed? This condition may be diagnosed based on:  Your symptoms and medical history.  Your history of alcohol use. Your health care provider may ask questions about your drinking behavior. It  is important to be honest when you answer these questions.  A psychological assessment.  A physical exam.  Blood tests or urine tests to measure blood alcohol level and to rule out other causes of symptoms.  MRI or CT scan. This may be done if you seem to have abnormal thinking or behaviors (altered mental status). Diagnosis can be difficult. People going through withdrawal often avoid seeking medical care and are not thinking clearly. Friends and family members play an important role in recognizing symptoms and encouraging loved ones to get treatment. How is this treated? Most people with symptoms of withdrawal can be treated outside of a hospital setting (outpatient treatment), with close monitoring such as daily check-ins with a health care provider and counseling. You may need treatment at a hospital or treatment center (inpatient treatment) if:  You have a history of delirium tremens or seizures.  You have severe symptoms.  You are addicted to other drugs.  You cannot swallow medicine.  You have a serious medical condition such as heart failure.  You experienced withdrawal in the past but then you continued drinking alcohol.  You are not likely to commit to an outpatient treatment schedule. Treatment may involve:  Monitoring your blood pressure, pulse, and breathing.  IV fluids to keep you hydrated.  Medicines to reduce withdrawal symptoms and discomfort (benzodiazepines).  Medicine to reduce anxiety.  Medicine to prevent or control seizures.  Multivitamins and B vitamins.  Having a health care provider check on you daily. It is important to get treatment for alcohol withdrawal early. Getting treatment early can:  Speed up your recovery from withdrawal symptoms.  Make you more successful with long-term stoppage of alcohol use (sobriety). If you need help to stop drinking, your health care provider may recommend a long-term treatment plan that includes:  Medicines  to help treat alcohol use disorder.  Substance abuse counseling.  Support groups. Follow these instructions at home:   Take over-the-counter and prescription medicines (including vitamin supplements) only as told by your health care provider.  Do not drink alcohol.  Do not drive until your health care provider approves.  Have someone you trust stay with you or be available if you need help with your symptoms or with not drinking.  Drink enough fluid to keep your urine pale yellow.  Consider joining an alcohol support group or treatment program. These can provide emotional support, advice, and guidance.  Keep all follow-up visits as told by your health care provider. This is important. Contact a health care provider if:  Your symptoms get worse instead of better.  You cannot eat or drink without vomiting.  You are struggling with not drinking alcohol.  You cannot stop drinking alcohol. Get help right away if:  You have an irregular heartbeat.  You have chest pain.  You have trouble breathing.  You have a seizure for the first time.  You hallucinate.  You become very confused. Summary  Alcohol withdrawal is a group of symptoms that can develop when a person who drinks heavily and regularly stops drinking or drinks less.  Symptoms of this condition can be mild to moderate, or they can be severe.  Treatment may include hospitalization, medicine, and counseling. This information is not intended  to replace advice given to you by your health care provider. Make sure you discuss any questions you have with your health care provider. Document Released: 08/20/2005 Document Revised: 07/17/2017 Document Reviewed: 07/17/2017 Elsevier Interactive Patient Education  2019 [employer].

## 2019-02-14 NOTE — Progress Notes (Signed)
Discharge to behavioral health picked up by the transport officer.

## 2019-02-14 NOTE — Tx Team (Signed)
Initial Treatment Plan 02/14/2019 4:34 PM Jason Bean AJO:[id number]    PATIENT STRESSORS: Health problems Substance abuse   PATIENT STRENGTHS: Ability for insight Average or above average intelligence General fund of knowledge Motivation for treatment/growth   PATIENT IDENTIFIED PROBLEMS: Depression Suicidal thoughts Pain "Get stable on my medications and find a place to go"                     DISCHARGE CRITERIA:  Ability to meet basic life and health needs Improved stabilization in mood, thinking, and/or behavior Reduction of life-threatening or endangering symptoms to within safe limits Verbal commitment to aftercare and medication compliance  PRELIMINARY DISCHARGE PLAN: Attend aftercare/continuing care group  PATIENT/FAMILY INVOLVEMENT: This treatment plan has been presented to and reviewed with the patient, Jason Bean, and/or family member, .  The patient and family have been given the opportunity to ask questions and make suggestions.  Jason Bean, [place], [place] 02/14/2019, 4:34 PM

## 2019-02-15 DIAGNOSIS — F10230 Alcohol dependence with withdrawal, uncomplicated: Secondary | ICD-10-CM

## 2019-02-15 DIAGNOSIS — F1024 Alcohol dependence with alcohol-induced mood disorder: Secondary | ICD-10-CM

## 2019-02-15 DIAGNOSIS — F10930 Alcohol use, unspecified with withdrawal, uncomplicated: Secondary | ICD-10-CM

## 2019-02-15 LAB — TSH: TSH: 0.76 u[IU]/mL (ref 0.350–4.500)

## 2019-02-15 LAB — CARBAMAZEPINE LEVEL, TOTAL: Carbamazepine Lvl: 4.3 ug/mL (ref 4.0–12.0)

## 2019-02-15 NOTE — Progress Notes (Signed)
Patient rated his day as a 5 out of 10. He states that he found out today that he was admitted to a half way house. His goal for tomorrow is to attend one group. -

## 2019-02-15 NOTE — Progress Notes (Signed)
Patient's R leg orthopedic boot has been laying on floor beside his bed all day.  Patient has been using walker throughout the day.  Respirations even and unlabored.  No signs/symptoms of pain/distress noted on patient's face/body movements.  Safety maintained with 15 minute checks.

## 2019-02-15 NOTE — H&P (Signed)
Psychiatric Admission Assessment Adult  Patient Identification: Jason Bean MRN:  [id number] Date of Evaluation:  02/15/2019 Chief Complaint:  ALCOHOL USE DISORDER MDD Principal Diagnosis: <principal problem not specified> Diagnosis:  Active Problems:   MDD (major depressive disorder), recurrent episode (HCC)  History of Present Illness: Patient is seen and examined.  Patient is a 58 year old male with a past psychiatric history significant for alcohol dependence as well as benzodiazepine dependence who presented to the [facility] emergency department on 3/20 who originally presented for concern for alcohol withdrawal, but was found to have a metabolic acidosis that was likely alcoholic ketoacidosis.  He was admitted to the medical hospital on 02/11/2019.  He admitted at that time that he had been living at some assisted living facility approximately 3 weeks ago, but left with "a friend".  He started drinking at that time.  He has been increasing his alcohol intake over the last 3 weeks.  Psychiatric consultation was obtained on 3/20.  He admitted that time he had been at admitted to assisted living facility after he fell when intoxicated and broke his foot.  He was not able to receive Xanax while in the facility so he decided to leave.  He immediately started drinking at that time.  Home medications appeared to be Xanax 0.5 mg p.o. 4 times daily, Elavil 25 mg p.o. nightly, Tegretol 100 mg p.o. 3 times daily, Lexapro 10 mg p.o. daily, gabapentin 300 mg p.o. 3 times daily, Remeron 15 mg p.o. nightly, trazodone 50 mg p.o. nightly as needed insomnia and Effexor extended release 150 mg p.o. daily.  He was apparently restarted on Xanax and gabapentin in the hospital.  Review of the PMP database revealed his last Xanax prescription was on 01/24/2403 120 0.5 mg tablets.  This was from a provider in [place].  Prior to that his last Xanax prescription was on 11/08/2018 again for 120 0.5 mg tablets of Xanax.  He  was stabilized and transferred to our facility on 02/15/2019.  He denied current suicidal ideation.  He stated he was depressed.  He also stated that he was very anxious and wanted to get back on his medications.  He was admitted to the hospital for detox, and unfortunately he is currently homeless and he is seeking either return to his assisted living facility or rehab.  Associated Signs/Symptoms: Depression Symptoms:  depressed mood, anhedonia, insomnia, psychomotor agitation, fatigue, feelings of worthlessness/guilt, difficulty concentrating, hopelessness, suicidal thoughts without plan, panic attacks, loss of energy/fatigue, disturbed sleep, weight loss, (Hypo) Manic Symptoms:  Impulsivity, Irritable Mood, Anxiety Symptoms:  Excessive Worry, Psychotic Symptoms:  denied PTSD Symptoms: Negative Total Time spent with patient: 30 minutes  Past Psychiatric History: Patient has had multiple psychiatric admissions in the past.  At least 4 where the electronic medical record is able to gain access to his last psychiatric hospitalization at [facility] was on 01/08/2017.  His last psychiatric hospitalization at our facility was on 11/22/2017.  He has been treated with multiple medications in the past.  He has been off his medication for an unspecified amount of time.  Is the patient at risk to self? Yes.    Has the patient been a risk to self in the past 6 months? Yes.    Has the patient been a risk to self within the distant past? Yes.    Is the patient a risk to others? No.  Has the patient been a risk to others in the past 6 months? No.  Has  the patient been a risk to others within the distant past? No.   Prior Inpatient Therapy:   Prior Outpatient Therapy:    Alcohol Screening: 1. How often do you have a drink containing alcohol?: 4 or more times a week 2. How many drinks containing alcohol do you have on a typical day when you are drinking?: 7, 8, or 9 3. How often do you have six  or more drinks on one occasion?: Daily or almost daily AUDIT-C Score: 11 4. How often during the last year have you found that you were not able to stop drinking once you had started?: Never 5. How often during the last year have you failed to do what was normally expected from you becasue of drinking?: Never 6. How often during the last year have you needed a first drink in the morning to get yourself going after a heavy drinking session?: Never 7. How often during the last year have you had a feeling of guilt of remorse after drinking?: Less than monthly 8. How often during the last year have you been unable to remember what happened the night before because you had been drinking?: Less than monthly 9. Have you or someone else been injured as a result of your drinking?: Yes, but not in the last year 10. Has a relative or friend or a doctor or another health worker been concerned about your drinking or suggested you cut down?: Yes, but not in the last year Alcohol Use Disorder Identification Test Final Score (AUDIT): 17 Alcohol Brief Interventions/Follow-up: Alcohol Education Substance Abuse History in the last 12 months:  Yes.   Consequences of Substance Abuse: Medical Consequences:  He was apparently intoxicated when he fell and broke the bone in his right lower extremity.  He was placed in a rehabilitation facility secondary to his alcohol and inability to care for himself at that time. Withdrawal Symptoms:   Cramps Diaphoresis Diarrhea Headaches Nausea Tremors Vomiting Previous Psychotropic Medications: Yes  Psychological Evaluations: Yes  Past Medical History:  Past Medical History:  Diagnosis Date  . Anxiety   . Arthritis   . COPD (chronic obstructive pulmonary disease) (HCC)   . Depression   . Hyperlipidemia   . Hypertension     Past Surgical History:  Procedure Laterality Date  . BACK SURGERY    . NECK SURGERY    . ORTHOPEDIC SURGERY     Family History: History  reviewed. No pertinent family history. Family Psychiatric  History: Noncontributory Tobacco Screening: Have you used any form of tobacco in the last 30 days? (Cigarettes, Smokeless Tobacco, Cigars, and/or Pipes): Yes Tobacco use, Select all that apply: 5 or more cigarettes per day Are you interested in Tobacco Cessation Medications?: Yes, will notify MD for an order Counseled patient on smoking cessation including recognizing danger situations, developing coping skills and basic information about quitting provided: Refused/Declined practical counseling Social History:  Social History   Substance and Sexual Activity  Alcohol Use Yes   Comment: Pt drinks daily . unknown amount pt. reports "about 5-6 40 oz beers daily over last week     Social History   Substance and Sexual Activity  Drug Use Yes  . Types: Benzodiazepines   Comment: pt. reports "alprazolam 0.5 TID    Additional Social History:                           Allergies:  No Known Allergies Lab Results: No results found  for this or any previous visit (from the past 48 hour(s)).  Blood Alcohol level:  Lab Results  Component Value Date   ETH 21 (H) 02/10/2019   ETH 316 (H) 11/26/2012    Metabolic Disorder Labs:  Lab Results  Component Value Date   HGBA1C 5.8 (H) 11/13/2017   MPG 119.76 11/13/2017   MPG 123 06/14/2017   No results found for: PROLACTIN Lab Results  Component Value Date   CHOL 146 11/13/2017   TRIG 237 (H) 11/13/2017   HDL 82 11/13/2017   CHOLHDL 1.8 11/13/2017   VLDL 47 (H) 11/13/2017   LDLCALC 17 11/13/2017   LDLCALC 47 06/14/2017    Current Medications: Current Facility-Administered Medications  Medication Dose Route Frequency Provider Last Rate Last Dose  . acetaminophen (TYLENOL) tablet 650 mg  650 mg Oral Q6H PRN Oneta Rack, NP   650 mg at 02/14/19 1741  . albuterol (PROVENTIL HFA;VENTOLIN HFA) 108 (90 Base) MCG/ACT inhaler 2 puff  2 puff Inhalation Q6H PRN Nira Conn  A, NP      . alum & mag hydroxide-simeth (MAALOX/MYLANTA) 200-200-20 MG/5ML suspension 30 mL  30 mL Oral Q4H PRN Oneta Rack, NP      . atorvastatin (LIPITOR) tablet 20 mg  20 mg Oral Daily Nira Conn A, NP   20 mg at 02/15/19 0913  . carbamazepine (TEGRETOL) chewable tablet 100 mg  100 mg Oral TID Nira Conn A, NP   100 mg at 02/15/19 1207  . escitalopram (LEXAPRO) tablet 10 mg  10 mg Oral Daily Oneta Rack, NP   10 mg at 02/15/19 0915  . folic acid (FOLVITE) tablet 1 mg  1 mg Oral Daily Nira Conn A, NP   1 mg at 02/15/19 0915  . gabapentin (NEURONTIN) capsule 300 mg  300 mg Oral TID Nira Conn A, NP   300 mg at 02/15/19 1206  . hydrOXYzine (ATARAX/VISTARIL) tablet 25 mg  25 mg Oral TID PRN Oneta Rack, NP   25 mg at 02/15/19 1206  . loperamide (IMODIUM) capsule 2-4 mg  2-4 mg Oral PRN Nira Conn A, NP      . LORazepam (ATIVAN) tablet 1 mg  1 mg Oral Q6H PRN Nira Conn A, NP      . magnesium hydroxide (MILK OF MAGNESIA) suspension 30 mL  30 mL Oral Daily PRN Oneta Rack, NP      . mirtazapine (REMERON) tablet 7.5 mg  7.5 mg Oral QHS Oneta Rack, NP   7.5 mg at 02/14/19 2300  . multivitamin with minerals tablet 1 tablet  1 tablet Oral Daily Nira Conn A, NP   1 tablet at 02/15/19 0913  . nicotine (NICODERM CQ - dosed in mg/24 hours) patch 21 mg  21 mg Transdermal Daily Cobos, Rockey Situ, MD   21 mg at 02/15/19 0913  . ondansetron (ZOFRAN-ODT) disintegrating tablet 8 mg  8 mg Oral Q8H PRN Nira Conn A, NP      . thiamine (VITAMIN B-1) tablet 100 mg  100 mg Oral Daily Nira Conn A, NP   100 mg at 02/15/19 0914  . traZODone (DESYREL) tablet 50 mg  50 mg Oral QHS PRN Oneta Rack, NP   50 mg at 02/14/19 2300  . venlafaxine XR (EFFEXOR-XR) 24 hr capsule 150 mg  150 mg Oral Q breakfast Oneta Rack, NP   150 mg at 02/15/19 1610   PTA Medications: Medications Prior to Admission  Medication Sig Dispense  Refill Last Dose  . albuterol (PROVENTIL HFA;VENTOLIN  HFA) 108 (90 Base) MCG/ACT inhaler Inhale 2 puffs into the lungs every 6 (six) hours as needed for wheezing or shortness of breath. 1 Inhaler 0 Past Week at Unknown time  . ALPRAZolam (XANAX) 0.5 MG tablet Take 0.5 mg by mouth 4 (four) times daily.   Past Week at Unknown time  . atorvastatin (LIPITOR) 20 MG tablet Take 20 mg by mouth daily.   Past Week at Unknown time  . carbamazepine (TEGRETOL) 100 MG chewable tablet Chew 100 mg by mouth 3 (three) times daily.   Past Week at Unknown time  . escitalopram (LEXAPRO) 10 MG tablet Take 10 mg by mouth daily.   Past Week at Unknown time  . folic acid (FOLVITE) 1 MG tablet Take 1 tablet (1 mg total) by mouth daily. 30 tablet 0   . gabapentin (NEURONTIN) 300 MG capsule Take 300 mg by mouth 3 (three) times daily.   Past Week at Unknown time  . Multiple Vitamin (MULTIVITAMIN WITH MINERALS) TABS tablet Take 1 tablet by mouth daily. 30 tablet 0   . ondansetron (ZOFRAN-ODT) 8 MG disintegrating tablet Take 1 tablet (8 mg total) by mouth every 8 (eight) hours as needed for nausea or vomiting. 20 tablet 0   . thiamine 100 MG tablet Take 1 tablet (100 mg total) by mouth daily. 30 tablet 0     Musculoskeletal: Strength & Muscle Tone: within normal limits Gait & Station: unsteady Patient leans: N/A  Psychiatric Specialty Exam: Physical Exam  Nursing note and vitals reviewed. Constitutional: He is oriented to person, place, and time. He appears well-developed and well-nourished.  HENT:  Head: Normocephalic and atraumatic.  Respiratory: Effort normal.  Neurological: He is alert and oriented to person, place, and time.    ROS  Blood pressure 108/74, pulse 75, temperature 98.5 F (36.9 C), temperature source Oral, resp. rate 18, height 5\' 8"  (1.727 m), weight 89.4 kg.Body mass index is 29.95 kg/m.  General Appearance: Disheveled  Eye Contact:  Poor  Speech:  Normal Rate  Volume:  Decreased  Mood:  Anxious, Depressed and Dysphoric  Affect:  Congruent   Thought Process:  Coherent and Descriptions of Associations: Circumstantial  Orientation:  Full (Time, Place, and Person)  Thought Content:  Logical  Suicidal Thoughts:  No  Homicidal Thoughts:  No  Memory:  Immediate;   Fair Recent;   Fair Remote;   Fair  Judgement:  Intact  Insight:  Fair  Psychomotor Activity:  Decreased  Concentration:  Concentration: Fair and Attention Span: Fair  Recall:  [employer]  Fund of Knowledge:  Fair  Language:  Fair  Akathisia:  Negative  Handed:  Right  AIMS (if indicated):     Assets:  Desire for Improvement Resilience  ADL's:  Intact  Cognition:  WNL  Sleep:  Number of Hours: 6.5    Treatment Plan Summary: Daily contact with patient to assess and evaluate symptoms and progress in treatment, Medication management and Plan : Patient is seen and examined.  Patient is a 58 year old male with the above-stated past psychiatric history who seen on admission secondary to suicidal ideation.  He will be admitted to the hospital.  He will be integrated into the milieu.  He will be detox with lorazepam 1 mg p.o. every 6 hours as needed a CIWA greater than 10.  He will not be continued on his Xanax.  I think giving this gentleman Xanax in the presence of his alcoholism is  a dangerous situation.  This is also true given his history of COPD.  He will have his albuterol, Lipitor, Tegretol, Lexapro, folic acid, gabapentin, hydroxyzine, Remeron continued.  He is on both Lexapro as well as the venlafaxine, and I will find out which one he has been on longer and stop 1 of these since it is basically duplicate therapy.  Review of his laboratories revealed his liver function enzymes to be normal.  His MCV is normal.  His drug screen was positive for benzodiazepines and his blood alcohol was only 21.  X-rays taken in the emergency room compared to films done on 01/26/2019 showed no acute findings on his right tibia or fibula.  The fixation hardware appear to be intact in approximately  position within the tibia.  No evidence of surgical complicating feature.  He does have a boot to wear as well as a walker, and I have encouraged him to keep his boot on.  Observation Level/Precautions:  Detox 15 minute checks Seizure  Laboratory:  Chemistry Profile  Psychotherapy:    Medications:    Consultations:    Discharge Concerns:    Estimated LOS:  Other:     Physician Treatment Plan for Primary Diagnosis: <principal problem not specified> Long Term Goal(s): Improvement in symptoms so as ready for discharge  Short Term Goals: Ability to identify changes in lifestyle to reduce recurrence of condition will improve, Ability to verbalize feelings will improve, Ability to disclose and discuss suicidal ideas, Ability to demonstrate self-control will improve, Ability to identify and develop effective coping behaviors will improve, Ability to maintain clinical measurements within normal limits will improve, Compliance with prescribed medications will improve and Ability to identify triggers associated with substance abuse/mental health issues will improve  Physician Treatment Plan for Secondary Diagnosis: Active Problems:   MDD (major depressive disorder), recurrent episode (HCC)  Long Term Goal(s): Improvement in symptoms so as ready for discharge  Short Term Goals: Ability to identify changes in lifestyle to reduce recurrence of condition will improve, Ability to verbalize feelings will improve, Ability to disclose and discuss suicidal ideas, Ability to demonstrate self-control will improve, Ability to identify and develop effective coping behaviors will improve, Ability to maintain clinical measurements within normal limits will improve, Compliance with prescribed medications will improve and Ability to identify triggers associated with substance abuse/mental health issues will improve  I certify that inpatient services furnished can reasonably be expected to improve the patient's  condition.    Antonieta Pert, MD 3/24/20201:01 PM

## 2019-02-15 NOTE — [facility] Suicide Risk Assessment (Signed)
[facility] Admission Suicide Risk Assessment   Nursing information obtained from:  Patient Demographic factors:  Male, Caucasian, Low socioeconomic status, Living alone, Unemployed Current Mental Status:  Self-harm thoughts Loss Factors:  Decline in physical health, Financial problems / change in socioeconomic status Historical Factors:  Prior suicide attempts, Family history of mental illness or substance abuse Risk Reduction Factors:  Positive coping skills or problem solving skills  Total Time spent with patient: 30 minutes Principal Problem: <principal problem not specified> Diagnosis:  Active Problems:   MDD (major depressive disorder), recurrent episode (HCC)  Subjective Data: Patient is seen and examined.  Patient is a 58 year old male with a past psychiatric history significant for alcohol dependence as well as benzodiazepine dependence who presented to the [facility] emergency department on 3/20 who originally presented for concern for alcohol withdrawal, but was found to have a metabolic acidosis that was likely alcoholic ketoacidosis.  He was admitted to the medical hospital on 02/11/2019.  He admitted at that time that he had been living at some assisted living facility approximately 3 weeks ago, but left with "a friend".  He started drinking at that time.  He has been increasing his alcohol intake over the last 3 weeks.  Psychiatric consultation was obtained on 3/20.  He admitted that time he had been at admitted to assisted living facility after he fell when intoxicated and broke his foot.  He was not able to receive Xanax while in the facility so he decided to leave.  He immediately started drinking at that time.  Home medications appeared to be Xanax 0.5 mg p.o. 4 times daily, Elavil 25 mg p.o. nightly, Tegretol 100 mg p.o. 3 times daily, Lexapro 10 mg p.o. daily, gabapentin 300 mg p.o. 3 times daily, Remeron 15 mg p.o. nightly, trazodone 50 mg p.o. nightly as needed insomnia and Effexor  extended release 150 mg p.o. daily.  He was apparently restarted on Xanax and gabapentin in the hospital.  Review of the PMP database revealed his last Xanax prescription was on 01/24/2403 120 0.5 mg tablets.  This was from a provider in [place].  Prior to that his last Xanax prescription was on 11/08/2018 again for 120 0.5 mg tablets of Xanax.  He was stabilized and transferred to our facility on 02/15/2019.  He denied current suicidal ideation.  He stated he was depressed.  He also stated that he was very anxious and wanted to get back on his medications.  He was admitted to the hospital for detox, and unfortunately he is currently homeless and he is seeking either return to his assisted living facility or rehab.  Continued Clinical Symptoms:  Alcohol Use Disorder Identification Test Final Score (AUDIT): 17 The "Alcohol Use Disorders Identification Test", Guidelines for Use in Primary Care, Second Edition.  World Science writer [facility]). Score between 0-7:  no or low risk or alcohol related problems. Score between 8-15:  moderate risk of alcohol related problems. Score between 16-19:  high risk of alcohol related problems. Score 20 or above:  warrants further diagnostic evaluation for alcohol dependence and treatment.   CLINICAL FACTORS:   Depression:   Anhedonia Comorbid alcohol abuse/dependence Hopelessness Impulsivity Insomnia Alcohol/Substance Abuse/Dependencies   Musculoskeletal: Strength & Muscle Tone: within normal limits Gait & Station: unsteady Patient leans: N/A  Psychiatric Specialty Exam: Physical Exam  Nursing note and vitals reviewed. Constitutional: He is oriented to person, place, and time. He appears well-developed and well-nourished.  HENT:  Head: Normocephalic and atraumatic.  Respiratory: Effort normal.  Neurological: He is alert and oriented to person, place, and time.    ROS  Blood pressure 129/88, pulse 70, temperature 97.9 F (36.6 C), resp. rate 18,  height 5\' 8"  (1.727 m), weight 89.4 kg.Body mass index is 29.95 kg/m.  General Appearance: Disheveled  Eye Contact:  Fair  Speech:  Normal Rate  Volume:  Normal  Mood:  Anxious  Affect:  Congruent  Thought Process:  Coherent and Descriptions of Associations: Circumstantial  Orientation:  Full (Time, Place, and Person)  Thought Content:  Logical  Suicidal Thoughts:  Yes.  without intent/plan  Homicidal Thoughts:  No  Memory:  Immediate;   Poor Recent;   Poor Remote;   Poor  Judgement:  Impaired  Insight:  Lacking  Psychomotor Activity:  Increased  Concentration:  Concentration: Fair and Attention Span: Fair  Recall:  [employer] of Knowledge:  Fair  Language:  Fair  Akathisia:  Negative  Handed:  Right  AIMS (if indicated):     Assets:  Desire for Improvement Resilience  ADL's:  Intact  Cognition:  WNL  Sleep:  Number of Hours: 6.5      COGNITIVE FEATURES THAT CONTRIBUTE TO RISK:  None    SUICIDE RISK:   Minimal: No identifiable suicidal ideation.  Patients presenting with no risk factors but with morbid ruminations; may be classified as minimal risk based on the severity of the depressive symptoms  PLAN OF CARE: Patient is seen and examined.  Patient is a 58 year old male with the above-stated past psychiatric history who seen on admission secondary to suicidal ideation.  He will be admitted to the hospital.  He will be integrated into the milieu.  He will be detox with lorazepam 1 mg p.o. every 6 hours as needed a CIWA greater than 10.  He will not be continued on his Xanax.  I think giving this gentleman Xanax in the presence of his alcoholism is a dangerous situation.  This is also true given his history of COPD.  He will have his albuterol, Lipitor, Tegretol, Lexapro, folic acid, gabapentin, hydroxyzine, Remeron continued.  He is on both Lexapro as well as the venlafaxine, and I will find out which one he has been on longer and stop 1 of these since it is basically  duplicate therapy.  Review of his laboratories revealed his liver function enzymes to be normal.  His MCV is normal.  His drug screen was positive for benzodiazepines and his blood alcohol was only 21.  X-rays taken in the emergency room compared to films done on 01/26/2019 showed no acute findings on his right tibia or fibula.  The fixation hardware appear to be intact in approximately position within the tibia.  No evidence of surgical complicating feature.  He does have a boot to wear as well as a walker, and I have encouraged him to keep his boot on.  I certify that inpatient services furnished can reasonably be expected to improve the patient's condition.   Antonieta Pert, MD 02/15/2019, 10:27 AM

## 2019-02-15 NOTE — [facility] Counselor (Signed)
Adult Comprehensive Assessment  Patient ID: Jason Bean, male   DOB: [birth year], 58 y.o.   MRN: [id number]  IInformation Source: Information source: Patient  Current Stressors:  Educational / Learning stressors: NA Employment / Job issues: NA Family Relationships: Strained with Ex GF and Daughter; no other family living. Financial / Lack of resources (include bankruptcy): Gets $1,000 a month in SSI. Has Managed Medicare and Medicaid. Lost his debit card so he had a replacement mailed to his ex-GF's, doesn't have the card on him.   Housing / Lack of housing:Essentially homeless. Reports he left an ALF a couple of weeks ago and can't return/ Says he's been staying on the streets for a couple of weeks. Physical health (include injuries & life threatening diseases): Prior back surgeries. Recently fell and broke his leg and went into a SNF.  Social relationships: Isolates, ex-GF Jason Bean is somewhat supportive, has an Programmer, applications Substance abuse: Long hx of ETOH use, recently relapsed Bereavement / Loss: Family relationships  Living/Environment/Situation:  Living Arrangements: Homeless for a couple of weeks Living conditions (as described by patient or guardian):"Staying here and there, sleeping on the streets." How long has patient lived in current situation?:A couple of weeks What is atmosphere in current home: Unsafe, temporary  Family History:  Marital status: Single Does patient have children?: Yes How many children?: 1  How is patient's relationship with their children?: Strained due to alcohol use  Childhood History:  By whom was/is the patient raised?: Grandparents Additional childhood history information: Patient spent majority of his time with grandmother as parents were working at nursing home they owned Description of patient's relationship with caregiver when they were a child: Good with GM Patient's description of current relationship with people who raised him/her:  Both GM and M & F are all deceased Does patient have siblings?: No Did patient suffer any verbal/emotional/physical/sexual abuse as a child?: No Did patient suffer from severe childhood neglect?: No Has patient ever been sexually abused/assaulted/raped as an adolescent or adult?: No Was the patient ever a victim of a crime or a disaster?: No Witnessed domestic violence?: No Has patient been effected by domestic violence as an adult?: No  Education:  Highest grade of school patient has completed: "9th; went to work for [employer]; he was an alcoholic too" Currently a Consulting civil engineer?: No Learning disability?: No  Employment/Work Situation:  Employment situation: On disability Why is patient on disability: Anxiety and Bipolar How long has patient been on disability: 20 years Patient's job has been impacted by current illness: No What is the longest time patient has a held a job?: 15 yrs Where was the patient employed at that time?: Landscaping Has patient ever been in the [employer]?: No Has patient ever served in Buyer, retail?: No  Financial Resources:  Surveyor, quantity resources: [employer];Food stamps;Medicaid;Medicare Does patient have a representative payee or guardian?: No  Alcohol/Substance Abuse:  What has been your use of drugs/alcohol within the last 12 months?: 1 case beer dailyfor several weeks.  If attempted suicide, did drugs/alcohol play a role in this?: (No attempt) Alcohol/Substance Abuse Treatment Hx: Past Tx, Inpatient If yes, describe treatment: [facility] in 2014, 05/2017, and 12/2018for detox.  Has alcohol/substance abuse ever caused legal problems?: No  Social Support System: Patient's Community Support System: Fair Museum/gallery exhibitions officer System: Ex Girlfriend and Daughter Type of faith/religion: Baptist How does patient's faith help to cope with current illness?: Prayer helps  Leisure/Recreation:  Leisure and Hobbies: Walking, Research scientist (life sciences)  races  Strengths/Needs:  What  things does the patient do well?: Getting along well with people when not drinking In what areas does patient struggle / problems for patient: Alcohol, depression and anxiety  Discharge Plan:  Does patient have access to transportation?:Yes, bus Will patient be returning to same living situation after discharge?:No, given [employer].  Currently receiving community mental health services: No, but if he can find housing in [place] he is interested in [facility] for IOP.  Does patient have financial barriers related to discharge medications?: No; disability income and [facility].  Summary/Recommendations:   Summary and Recommendations (to be completed by the evaluator):  Patient is 58 year old male who identifies as homeless. He was living in an ALF and recently left and is not eligible to return. He presents to the hospital seeking treatment for SI, depression/anxiety/mood lability, alcohol detox, and for medication stabilization. Patient was last admitted to [facility] 10/2017 and 05/2017 with similar presentation. Patient reports that he recently relapsed on alcohol abuse (up to a case of beer daily). Patient denies drug use. Patient has a diagnosis of MDD, recurrent, severe and Alcohol Use Disorder, severe. Patient primary stressor is homelessness and is interested in following up with [facility], if he remains in [place], he wants to follow up with [facility] for IOP. Recommendations for patient include: crisis stabilization, therapeutic milieu, encourage group attendance and participation, medication management for mood stabilization/detox, and development of comprehensive mental wellness/sobriety plan.  Jason Bean. 02/15/2019

## 2019-02-15 NOTE — Progress Notes (Signed)
D:  Patient stated he does have SI thoughts, off/on, contracts for safety, no plan.  Denied HI.  Denied A/V hallucinations.  A:  Medications administered per MD orders.  Emotional support and encouragement given patient. R:  Safety maintained with 15 minute checks. Patient has been in bed all morning.  Got out of bed at lunch time and walked to bathroom using walker.  Patient was not wearing his orthopedic boot on R leg.  Patient has not complained of pain.  Respirations even and unlabored.  No signs/symptoms of pain/distress noted on patient's face/body movements.

## 2019-02-15 NOTE — Plan of Care (Signed)
Nurse discussed depression and coping skills with patient.  

## 2019-02-15 NOTE — Progress Notes (Signed)
Patient reports he left [facility] in [place] a couple of weeks ago and has been sleeping on the streets since then. Patient receives about $1,000 monthly in SSI. CSW explained to patient that placement into an ALF is generally not facilitated from [facility] as the process can be time consuming and patient will need to work with a primary care physician if he wants to pursue ALF placement again. Patient voiced understanding.  CSW provided patient with a list of [address] in [place] and [address] with available beds and encouraged patient to reach out to his social supports. Patient observed making phone calls in the 300 hall.  Enid Cutter, LCSW-A Clinical Social Worker

## 2019-02-16 NOTE — Plan of Care (Signed)
Patient was anxious and somewhat guarded upon approach. Denies SI HI AVH at this time, verbally contracts for safety. Denies physical pain. Patient is compliant with medications at this time, no side effects noted. Safety is maintained with 15 minute checks as well as environmental checks. Will continue to monitor and provide support.  Problem: Education: Goal: Emotional status will improve Outcome: Progressing Goal: Mental status will improve Outcome: Progressing Goal: Verbalization of understanding the information provided will improve Outcome: Progressing   Problem: Activity: Goal: Interest or engagement in activities will improve Outcome: Progressing

## 2019-02-16 NOTE — Progress Notes (Signed)
[facility] MD Progress Note  02/16/2019 12:31 PM Jason Bean  MRN:  [id number] Subjective:   Patient is a 58 y.o. male who was transferred to [facility] after admission at [facility] for alcohol ketoacidosis. Patient reported that he was suicidal with a plan to overdose on his medications. Patient has a history of alcohol use and Xanax use and was at an assisted living facility due to breaking his leg while intoxicated. He left the facility about three weeks ago because they would not prescribe Xanax.   Patient is alert and oriented x 4, pleasant and cooperative. Speech is clear and coherent, normal in volume. Thought process is logical. Denies current SI. Reports depression as 8/10 and anxiety as 10/10. States that he is hoping to go to the [facility] after discharge. Denies HI and AVH. Reports that he did not sleep at all last night, nursing notes indicate that he slept 5.75 hours. No indication that he is responding to internal stimuli.   Principal Problem: Uncomplicated alcohol withdrawal without perceptual disturbances (HCC) Diagnosis: Principal Problem:   Uncomplicated alcohol withdrawal without perceptual disturbances (HCC) Active Problems:   Hypertension   Hyperlipidemia   Neck pain, chronic   Substance induced mood disorder (HCC)  Total Time spent with patient: 30 minutes  Past Psychiatric History: Patient has had multiple psychiatric admissions. He was last at [facility] 10/2017 and was at [facility] 12/2016. History of alcohol abuse.    Past Medical History:  Past Medical History:  Diagnosis Date  . Anxiety   . Arthritis   . COPD (chronic obstructive pulmonary disease) (HCC)   . Depression   . Hyperlipidemia   . Hypertension     Past Surgical History:  Procedure Laterality Date  . BACK SURGERY    . NECK SURGERY    . ORTHOPEDIC SURGERY     Family History: History reviewed. No pertinent family history. Family Psychiatric  History: No pertinent family history Social History:  Social  History   Substance and Sexual Activity  Alcohol Use Yes   Comment: Pt drinks daily . unknown amount pt. reports "about 5-6 40 oz beers daily over last week     Social History   Substance and Sexual Activity  Drug Use Yes  . Types: Benzodiazepines   Comment: pt. reports "alprazolam 0.5 TID    Social History   Socioeconomic History  . Marital status: Single    Spouse name: Not on file  . Number of children: Not on file  . Years of education: Not on file  . Highest education level: Not on file  Occupational History  . Not on file  Social Needs  . Financial resource strain: Not on file  . Food insecurity:    Worry: Not on file    Inability: Not on file  . Transportation needs:    Medical: Not on file    Non-medical: Not on file  Tobacco Use  . Smoking status: Current Every Day Smoker    Packs/day: 1.00    Years: 30.00    Pack years: 30.00    Types: Cigarettes  . Smokeless tobacco: Never Used  Substance and Sexual Activity  . Alcohol use: Yes    Comment: Pt drinks daily . unknown amount pt. reports "about 5-6 40 oz beers daily over last week  . Drug use: Yes    Types: Benzodiazepines    Comment: pt. reports "alprazolam 0.5 TID  . Sexual activity: Not Currently  Lifestyle  . Physical activity:  Days per week: Not on file    Minutes per session: Not on file  . Stress: Not on file  Relationships  . Social connections:    Talks on phone: Not on file    Gets together: Not on file    Attends religious service: Not on file    Active member of club or organization: Not on file    Attends meetings of clubs or organizations: Not on file    Relationship status: Not on file  Other Topics Concern  . 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  Has daughter   Additional Social History:                         Sleep: Fair  Appetite:  Good  Current Medications: Current Facility-Administered Medications   Medication Dose Route Frequency Provider Last Rate Last Dose  . acetaminophen (TYLENOL) tablet 650 mg  650 mg Oral Q6H PRN Oneta Rack, NP   650 mg at 02/14/19 1741  . albuterol (PROVENTIL HFA;VENTOLIN HFA) 108 (90 Base) MCG/ACT inhaler 2 puff  2 puff Inhalation Q6H PRN Nira Conn A, NP      . alum & mag hydroxide-simeth (MAALOX/MYLANTA) 200-200-20 MG/5ML suspension 30 mL  30 mL Oral Q4H PRN Oneta Rack, NP      . atorvastatin (LIPITOR) tablet 20 mg  20 mg Oral Daily Nira Conn A, NP   20 mg at 02/16/19 0825  . carbamazepine (TEGRETOL) chewable tablet 100 mg  100 mg Oral TID Nira Conn A, NP   100 mg at 02/16/19 1214  . escitalopram (LEXAPRO) tablet 10 mg  10 mg Oral Daily Oneta Rack, NP   10 mg at 02/16/19 0825  . folic acid (FOLVITE) tablet 1 mg  1 mg Oral Daily Nira Conn A, NP   1 mg at 02/16/19 0825  . gabapentin (NEURONTIN) capsule 300 mg  300 mg Oral TID Nira Conn A, NP   300 mg at 02/16/19 1214  . hydrOXYzine (ATARAX/VISTARIL) tablet 25 mg  25 mg Oral TID PRN Oneta Rack, NP   25 mg at 02/15/19 1206  . loperamide (IMODIUM) capsule 2-4 mg  2-4 mg Oral PRN Nira Conn A, NP      . LORazepam (ATIVAN) tablet 1 mg  1 mg Oral Q6H PRN Nira Conn A, NP      . magnesium hydroxide (MILK OF MAGNESIA) suspension 30 mL  30 mL Oral Daily PRN Oneta Rack, NP      . mirtazapine (REMERON) tablet 7.5 mg  7.5 mg Oral QHS Oneta Rack, NP   7.5 mg at 02/14/19 2300  . multivitamin with minerals tablet 1 tablet  1 tablet Oral Daily Nira Conn A, NP   1 tablet at 02/16/19 0825  . nicotine (NICODERM CQ - dosed in mg/24 hours) patch 21 mg  21 mg Transdermal Daily Cobos, Rockey Situ, MD   21 mg at 02/15/19 0913  . ondansetron (ZOFRAN-ODT) disintegrating tablet 8 mg  8 mg Oral Q8H PRN Nira Conn A, NP      . thiamine (VITAMIN B-1) tablet 100 mg  100 mg Oral Daily Nira Conn A, NP   100 mg at 02/16/19 0825  . traZODone (DESYREL) tablet 50 mg  50 mg Oral QHS PRN Oneta Rack, NP   50 mg at 02/14/19 2300  . venlafaxine XR (EFFEXOR-XR) 24  hr capsule 150 mg  150 mg Oral Q breakfast Oneta Rack, NP   150 mg at 02/16/19 6213    Lab Results:  Results for orders placed or performed during the hospital encounter of 02/14/19 (from the past 48 hour(s))  TSH     Status: None   Collection Time: 02/15/19  6:43 PM  Result Value Ref Range   TSH 0.760 0.350 - 4.500 uIU/mL    Comment: Performed by a 3rd Generation assay with a functional sensitivity of <=0.01 uIU/mL. Performed at [facility], [address] [place], [place] [address]   Carbamazepine level, total     Status: None   Collection Time: 02/15/19  6:43 PM  Result Value Ref Range   Carbamazepine Lvl 4.3 4.0 - 12.0 ug/mL    Comment: Performed at [facility], [address], [place], [place] [address]    Blood Alcohol level:  Lab Results  Component Value Date   ETH 21 (H) 02/10/2019   ETH 316 (H) 11/26/2012    Metabolic Disorder Labs: Lab Results  Component Value Date   HGBA1C 5.8 (H) 11/13/2017   MPG 119.76 11/13/2017   MPG 123 06/14/2017   No results found for: PROLACTIN Lab Results  Component Value Date   CHOL 146 11/13/2017   TRIG 237 (H) 11/13/2017   HDL 82 11/13/2017   CHOLHDL 1.8 11/13/2017   VLDL 47 (H) 11/13/2017   LDLCALC 17 11/13/2017   LDLCALC 47 06/14/2017    Physical Findings: AIMS: Facial and Oral Movements Muscles of Facial Expression: None, normal Lips and Perioral Area: None, normal Jaw: None, normal Tongue: None, normal,Extremity Movements Upper (arms, wrists, hands, fingers): None, normal Lower (legs, knees, ankles, toes): None, normal, Trunk Movements Neck, shoulders, hips: None, normal, Overall Severity Severity of abnormal movements (highest score from questions above): None, normal Incapacitation due to abnormal movements: None, normal Patient's awareness of abnormal movements (rate only patient's report): No Awareness, Dental  Status Current problems with teeth and/or dentures?: No Does patient usually wear dentures?: No  CIWA:  CIWA-Ar Total: 1 COWS:  COWS Total Score: 1  Musculoskeletal: Strength & Muscle Tone: within normal limits Gait & Station: Steady Patient leans: N/A  Psychiatric Specialty Exam: Physical Exam  Constitutional: He is oriented to person, place, and time. He appears well-developed and well-nourished. No distress.  Respiratory: Effort normal. No respiratory distress.  Neurological: He is alert and oriented to person, place, and time.  Skin: He is not diaphoretic.    Review of Systems  Respiratory: Negative for cough and shortness of breath.   Cardiovascular: Negative for chest pain.  Gastrointestinal: Negative for nausea and vomiting.  Psychiatric/Behavioral: Positive for depression and substance abuse. Negative for hallucinations and memory loss. The patient is nervous/anxious and has insomnia.     Blood pressure 126/80, pulse 74, temperature 98.5 F (36.9 C), temperature source Oral, resp. rate 20, height  (1.727 m), weight 89.4 kg, SpO2 96 %.Body mass index is 29.95 kg/m.  General Appearance: Casual and Fairly Groomed  Eye Contact:  Fair  Speech:  Clear and Coherent and Normal Rate  Volume:  Normal  Mood:  Anxious and Depressed  Affect:  Congruent and Depressed  Thought Process:  Coherent, Goal Directed and Descriptions of Associations: Intact  Orientation:  Full (Time, Place, and Person)  Thought Content:  Logical and Hallucinations: None  Suicidal Thoughts:  No  Homicidal Thoughts:  No  Memory:  Immediate;   Fair Recent;  Fair  Judgement:  Intact  Insight:  Lacking  Psychomotor Activity:  Normal  Concentration:  Concentration: Good and Attention Span: Fair  Recall:  [employer]  Fund of Knowledge:  Fair  Language:  Fair  Akathisia:  Negative  Handed:  Right  AIMS (if indicated):     Assets:  Communication Skills Desire for Improvement Financial  Resources/Insurance Leisure Time  ADL's:  Intact  Cognition:  WNL  Sleep:  Number of Hours: 5.75     Treatment Plan Summary: Daily contact with patient to assess and evaluate symptoms and progress in treatment and Medication management  Continue Lexapro 10 mg daily for depression Continue Tegretol 100 mg TID for seizures Continue Neurontin 300 mg TID for anxiety/alcohol withdraw/pain Continue Remeron 7.5mg  QHS for depression/sleep Continue Effexor 150 mg daily for depression Continue Ativan 1 mg every 6 hours prn CIWA >10 Continue Trazodone 50 mg QHS prn sleep Continue thiamine 100 mg daily Continue MVI daily Continue folic acid 1 mg daily  Social work planning Discharge.   Jason PolingJason A Britta Louth, NP 02/16/2019, 12:31 PM

## 2019-02-16 NOTE — Progress Notes (Signed)
Patient's positive event for the day was that he had a good talk with his social worker about his discharge plans. His goal for tomorrow is to go to the [facility] in [place].

## 2019-02-16 NOTE — [facility] Suicide Risk Assessment (Signed)
[facility] INPATIENT:  Family/Significant Other Suicide Prevention Education  Suicide Prevention Education:  Contact Attempts: friend, Kennith Maes [phone]) has been identified by the patient as the family member/significant other with whom the patient will be residing, and identified as the person(s) who will aid the patient in the event of a mental health crisis.  With written consent from the patient, two attempts were made to provide suicide prevention education, prior to and/or following the patient's discharge.  We were unsuccessful in providing suicide prevention education.  A suicide education pamphlet was given to the patient to share with family/significant other.  Date and time of first attempt: 02/16/2019 at 9:55am  Darreld Mclean 02/16/2019, 9:56 AM

## 2019-02-16 NOTE — Progress Notes (Signed)
Recreation Therapy Notes  Date:  3.25.20 Time: 0930 Location: 300 Hall Dayroom  Group Topic: Stress Management  Goal Area(s) Addresses:  Patient will identify positive stress management techniques. Patient will identify benefits of using stress management post d/c.  Intervention:  Stress Management  Activity :  Progressive Muscle Relaxation.  LRT introduced the stress management technique of progressive muscle relaxation.  LRT read a script that lead patients in tensing and relaxing each muscle group.  Patients were to follow along as script was read.    Education:  Stress Management, Discharge Planning.   Education Outcome: Acknowledges Education  Clinical Observations/Feedback: Pt did not attend group session.     Caroll Rancher, LRT/CTRS         [name], Jovannie Ulibarri A 02/16/2019 10:50 AM

## 2019-02-16 NOTE — Tx Team (Signed)
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 acetaminophen (TYLENOL) tablet 650 mg  650 mg Oral Q6H PRN Oneta Rack, NP   650 mg at 02/14/19 1741  . albuterol (PROVENTIL HFA;VENTOLIN HFA) 108 (90 Base) MCG/ACT inhaler 2 puff  2 puff Inhalation Q6H PRN Nira Conn A, NP      . alum & mag hydroxide-simeth (MAALOX/MYLANTA) 200-200-20 MG/5ML suspension 30 mL  30 mL Oral Q4H PRN Oneta Rack, NP      . atorvastatin (LIPITOR) tablet 20 mg  20 mg Oral Daily Nira Conn A, NP   20 mg at 02/16/19 0825  . carbamazepine (TEGRETOL) chewable tablet 100 mg  100 mg Oral TID Nira Conn A, NP   100 mg at 02/16/19 0825  . escitalopram (LEXAPRO) tablet 10 mg  10 mg Oral Daily Oneta Rack, NP   10 mg at 02/16/19 0825  . folic acid (FOLVITE) tablet 1 mg  1 mg Oral Daily Nira Conn A, NP   1 mg at 02/16/19 0825  . gabapentin (NEURONTIN) capsule 300 mg  300 mg Oral TID Nira Conn A, NP   300 mg at 02/16/19 0825  . hydrOXYzine (ATARAX/VISTARIL) tablet 25 mg  25 mg Oral TID PRN Oneta Rack, NP   25 mg at 02/15/19 1206  . loperamide (IMODIUM) capsule 2-4 mg  2-4 mg Oral PRN Nira Conn A, NP      . LORazepam (ATIVAN) tablet 1 mg  1 mg Oral Q6H PRN Nira Conn A, NP      . magnesium hydroxide (MILK OF MAGNESIA) suspension 30 mL  30 mL Oral Daily PRN Oneta Rack, NP      . mirtazapine (REMERON) tablet 7.5 mg  7.5 mg Oral QHS Oneta Rack, NP   7.5 mg at 02/14/19  2300  . multivitamin with minerals tablet 1 tablet  1 tablet Oral Daily Nira Conn A, NP   1 tablet at 02/16/19 0825  . nicotine (NICODERM CQ - dosed in mg/24 hours) patch 21 mg  21 mg Transdermal Daily Cobos, Rockey Situ, MD   21 mg at 02/15/19 0913  . ondansetron (ZOFRAN-ODT) disintegrating tablet 8 mg  8 mg Oral Q8H PRN Nira Conn A, NP      . thiamine (VITAMIN B-1) tablet 100 mg  100 mg Oral Daily Nira Conn A, NP   100 mg at 02/16/19 0825  . traZODone (DESYREL) tablet 50 mg  50 mg Oral QHS PRN Oneta Rack, NP   50 mg at 02/14/19 2300  . venlafaxine XR (EFFEXOR-XR) 24 hr capsule 150 mg  150 mg Oral Q breakfast Oneta Rack, NP   150 mg at 02/16/19 0825   PTA Medications: Medications Prior to Admission  Medication Sig Dispense Refill Last Dose  . albuterol (PROVENTIL HFA;VENTOLIN HFA) 108 (90 Base) MCG/ACT inhaler Inhale 2 puffs into the lungs every 6 (six) hours as needed for wheezing or shortness of breath. 1 Inhaler 0 Past Week at Unknown time  . ALPRAZolam (XANAX) 0.5 MG tablet Take 0.5 mg by mouth  4 (four) times daily.   Past Week at Unknown time  . atorvastatin (LIPITOR) 20 MG tablet Take 20 mg by mouth daily.   Past Week at Unknown time  . carbamazepine (TEGRETOL) 100 MG chewable tablet Chew 100 mg by mouth 3 (three) times daily.   Past Week at Unknown time  . escitalopram (LEXAPRO) 10 MG tablet Take 10 mg by mouth daily.   Past Week at Unknown time  . folic acid (FOLVITE) 1 MG tablet Take 1 tablet (1 mg total) by mouth daily. 30 tablet 0   . gabapentin (NEURONTIN) 300 MG capsule Take 300 mg by mouth 3 (three) times daily.   Past Week at Unknown time  . Multiple Vitamin (MULTIVITAMIN WITH MINERALS) TABS tablet Take 1 tablet by mouth daily. 30 tablet 0   . ondansetron (ZOFRAN-ODT) 8 MG disintegrating tablet Take 1 tablet (8 mg total) by mouth every 8 (eight) hours as needed for nausea or vomiting. 20 tablet 0   . thiamine 100 MG tablet Take 1 tablet (100 mg total) by mouth  daily. 30 tablet 0     Patient Stressors: Health problems Substance abuse  Patient Strengths: Ability for insight Average or above average intelligence General fund of knowledge Motivation for treatment/growth  Treatment Modalities: Medication Management, Group therapy, Case management,  1 to 1 session with clinician, Psychoeducation, Recreational therapy.   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) Long Term Goal(s): Improvement in symptoms so as ready for discharge Improvement in symptoms so as ready for discharge   Short Term Goals: Ability to identify changes in lifestyle to reduce recurrence of condition will improve Ability to verbalize feelings will improve Ability to disclose and discuss suicidal ideas Ability to demonstrate self-control will improve Ability to identify and develop effective coping behaviors will improve Ability to maintain clinical measurements within normal limits will improve Compliance with prescribed medications will improve Ability to identify triggers associated with substance abuse/mental health issues will improve Ability to identify changes in lifestyle to reduce recurrence of condition will improve Ability to verbalize feelings will improve Ability to disclose and discuss suicidal ideas Ability to demonstrate self-control will improve Ability to identify and develop effective coping behaviors will improve Ability to maintain clinical measurements within normal limits will improve Compliance with prescribed medications will improve Ability to identify triggers associated with substance abuse/mental health issues will improve  Medication Management: Evaluate patient's response, side effects, and tolerance of medication regimen.  Therapeutic Interventions: 1 to 1 sessions, Unit Group sessions and Medication administration.  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) Active Problems:   Hypertension   Hyperlipidemia   Neck pain, chronic   Substance induced mood disorder (HCC)  Long Term Goal(s): Improvement in symptoms so as ready for discharge Improvement in symptoms so as ready for discharge   Short Term Goals: Ability to identify changes in lifestyle to reduce recurrence of condition will improve Ability to verbalize feelings will improve Ability to disclose and discuss suicidal ideas Ability to demonstrate self-control will improve Ability to identify and develop effective coping behaviors will improve Ability to maintain clinical measurements within normal limits will improve Compliance with prescribed medications will improve Ability to identify triggers associated with substance abuse/mental health issues will improve Ability to identify changes in lifestyle to reduce recurrence of condition will improve Ability to verbalize feelings will improve Ability to disclose  and discuss suicidal ideas Ability to demonstrate self-control will improve Ability to identify and develop effective coping behaviors will improve Ability to maintain clinical measurements within normal limits will improve Compliance with prescribed medications will improve Ability to identify triggers associated with substance abuse/mental health issues will improve     Medication Management: Evaluate patient's response, side effects, and tolerance of medication regimen.  Therapeutic Interventions: 1 to 1 sessions, Unit Group sessions and Medication administration.  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 RN will report any adverse and/or side effects to prescribing provider.  Therapeutic Interventions: 1 on 1 counseling sessions, Psychoeducation, Medication administration, Evaluate responses to treatment, Monitor vital signs and CBGs as ordered, Perform/monitor CIWA, COWS, AIMS and Fall Risk screenings as ordered, Perform wound care treatments as ordered.  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  Short Term Goals: Engage patient in aftercare planning with referrals and resources, Increase social support, Identify triggers associated with mental health/substance abuse issues and Increase skills for wellness and recovery  Therapeutic Interventions: Assess for all discharge needs, 1 to 1 time with Social worker, Explore available resources and support systems, Assess for adequacy in community support network, Educate family and significant other(s) on suicide prevention, Complete Psychosocial Assessment, Interpersonal group therapy.  Evaluation of Outcomes: Progressing   Progress in Treatment: Attending groups: No. Participating in groups: No. Taking medication as prescribed: Yes. Toleration medication: Yes. Family/Significant other contact made: No, will contact:  attempted to reach friend, [name] one time. Will make a second attempt Patient understands diagnosis:  Yes. Discussing patient identified problems/goals with staff: Yes. Medical problems stabilized or resolved: Yes. Denies suicidal/homicidal ideation: Yes. Issues/concerns per patient self-inventory: Yes.  New problem(s) identified: Yes, Describe:  homeless, limited social supports  New Short Term/Long Term Goal(s): detox, medication management for mood stabilization; elimination of SI thoughts; development of comprehensive mental wellness/sobriety plan.  Patient Goals:  Find housing  Discharge Plan or Barriers: Patient has an in person [facility] final interview on 03/25, CSW will meet with patient to discuss outpatient follow up. [facility] pamphlet, Mobile Crisis information, and AA/NA information provided to patient for additional community support and resources.   Reason for Continuation of Hospitalization: Anxiety Depression  Estimated Length of Stay: 1-2 days  Attendees: Patient: Jason Bean 02/16/2019 9:57 AM  Physician:  02/16/2019 9:57 AM  Nursing:  02/16/2019 9:57 AM  RN Care Manager: 02/16/2019 9:57 AM  Social Worker: Enid Cutter, [place] 02/16/2019 9:57 AM  Recreational Therapist:  02/16/2019 9:57 AM  Other:  02/16/2019 9:57 AM  Other:  02/16/2019 9:57 AM  Other: 02/16/2019 9:57 AM    Scribe for Treatment Team: Darreld Mclean, LCSWA 02/16/2019 9:57 AM

## 2019-02-16 NOTE — [facility] Suicide Risk Assessment (Signed)
[facility] INPATIENT:  Family/Significant Other Suicide Prevention Education  Suicide Prevention Education:  Family/Significant Other Refusal to Support Patient after Discharge:  Suicide Prevention Education Not Provided:  Patient has identified home of family/significant other as the place the patient will be residing after discharge.  With written consent of the patient, two attempts were made to provide Suicide Prevention Education to friend, Jason Bean [phone])   This person indicates he/she will not be responsible for the patient after discharge.   Jason Bean was very angry that CSW attempted contact. She says she has not seen the patient in four years and explained she would not have anything to do with the patient.  Darreld Mclean 02/16/2019,1:28 PM

## 2019-02-17 ENCOUNTER — Other Ambulatory Visit: Payer: Self-pay

## 2019-02-17 MED ORDER — ESCITALOPRAM OXALATE 10 MG PO TABS: 10.0000 mg | ORAL_TABLET | Freq: Every day | ORAL | 0 refills | Status: AC

## 2019-02-17 MED ORDER — HYDROXYZINE HCL 25 MG PO TABS: 25.0000 mg | ORAL_TABLET | Freq: Three times a day (TID) | ORAL | 0 refills | Status: AC | PRN

## 2019-02-17 MED ORDER — NICOTINE 21 MG/24HR TD PT24: 21.0000 mg | MEDICATED_PATCH | Freq: Every day | TRANSDERMAL | 0 refills | Status: AC

## 2019-02-17 MED ORDER — MIRTAZAPINE 7.5 MG PO TABS: 7.5000 mg | ORAL_TABLET | Freq: Every day | ORAL | 0 refills | Status: AC

## 2019-02-17 NOTE — Progress Notes (Signed)
  [facility] Adult Case Management Discharge Plan :  Will you be returning to the same living situation after discharge:  No. Going to an [facility] in [place] At discharge, do you have transportation home?: Yes,  taxi voucher on chart, wants to leave at 11:00am Do you have the ability to pay for your medications: Yes,  Medicare Insurance  Release of information consent forms completed and in the chart. Taxi voucher on chart  Patient to Follow up at: Follow-up Information    [facility] Follow up.   Contact information: [address] [place] [address] [phone]           Next level of care provider has access to [facility] Link:no  Safety Planning and Suicide Prevention discussed: Yes,  with patient. Collateral contact declined to review SPE  Have you used any form of tobacco in the last 30 days? (Cigarettes, Smokeless Tobacco, Cigars, and/or Pipes): Yes  Has patient been referred to the Quitline?: Patient refused referral  Patient has been referred for addiction treatment: Yes  Darreld Mclean, LCSWA 02/17/2019, 9:03 AM

## 2019-02-17 NOTE — Plan of Care (Signed)
  Problem: Education: Goal: Knowledge of [facility] Education information/materials will improve Outcome: Adequate for Discharge   Problem: Education: Goal: Emotional status will improve Outcome: Adequate for Discharge   Problem: Education: Goal: Mental status will improve Outcome: Adequate for Discharge   Problem: Education: Goal: Verbalization of understanding the information provided will improve Outcome: Adequate for Discharge   

## 2019-02-17 NOTE — Progress Notes (Signed)
D: Pt was in dayroom upon initial approach.  Pt presents with appropriate affect and mood.  He describes his day as "all right."  He states he is "leaving tomorrow to go to [facility] in [place]."  Pt reports he feels safe to discharge.  Pt denies SI/HI, denies hallucinations, denies pain.  Pt has been visible in milieu interacting with peers and staff appropriately.  Pt attended evening group.    A: Introduced self to pt.  Met with pt 1:1.  Actively listened to pt and offered support and encouragement.  Medication administered per order.  Q15 minute safety checks maintained.  R: Pt is safe on the unit.  Pt is compliant with medication.  Pt verbally contracts for safety.  Will continue to monitor and assess.   

## 2019-02-17 NOTE — Progress Notes (Signed)
Discharge note: Patient reviewed discharge paperwork with RN including prescriptions, follow up appointments, and lab work. Patient given the opportunity to ask questions. All concerns were addressed. All belongings were returned to patient. Denied SI/HI/AVH. Patient thanked staff for their care while at the hospital.   Patient was discharged to lobby with a taxi voucher for $33 to go to an [facility] in [place].

## 2019-02-17 NOTE — Discharge Summary (Signed)
Physician Discharge Summary Note  Patient:  Jason Bean is an 58 y.o., male MRN:  [id number] DOB:  11-30-1960 Patient phone:  [phone] (home)  Patient address:   [address] [place] [address],  Total Time spent with patient: 15 minutes  Date of Admission:  02/14/2019 Date of Discharge: 02/17/19  Reason for Admission:  Alcohol withdrawal  Principal Problem: Uncomplicated alcohol withdrawal without perceptual disturbances [facility]) Discharge Diagnoses: Principal Problem:   Uncomplicated alcohol withdrawal without perceptual disturbances (HCC) Active Problems:   Hypertension   Hyperlipidemia   Neck pain, chronic   Substance induced mood disorder [facility])   Past Psychiatric History: Per admission H&P: Patient has had multiple psychiatric admissions in the past.  At least 4 where the electronic medical record is able to gain access to his last psychiatric hospitalization at [facility] was on 01/08/2017.  His last psychiatric hospitalization at our facility was on 11/22/2017.  He has been treated with multiple medications in the past.  He has been off his medication for an unspecified amount of time.  Past Medical History:  Past Medical History:  Diagnosis Date  . Anxiety   . Arthritis   . COPD (chronic obstructive pulmonary disease) (HCC)   . Depression   . Hyperlipidemia   . Hypertension     Past Surgical History:  Procedure Laterality Date  . BACK SURGERY    . NECK SURGERY    . ORTHOPEDIC SURGERY     Family History: History reviewed. No pertinent family history. Family Psychiatric  History: Per admission H&P: Noncontributory Social History:  Social History   Substance and Sexual Activity  Alcohol Use Yes   Comment: Pt drinks daily . unknown amount pt. reports "about 5-6 40 oz beers daily over last week     Social History   Substance and Sexual Activity  Drug Use Yes  . Types: Benzodiazepines   Comment: pt. reports "alprazolam 0.5 TID    Social  History   Socioeconomic History  . Marital status: Single    Spouse name: Not on file  . Number of children: Not on file  . Years of education: Not on file  . Highest education level: Not on file  Occupational History  . Not on file  Social Needs  . Financial resource strain: Not on file  . Food insecurity:    Worry: Not on file    Inability: Not on file  . Transportation needs:    Medical: Not on file    Non-medical: Not on file  Tobacco Use  . Smoking status: Current Every Day Smoker    Packs/day: 1.00    Years: 30.00    Pack years: 30.00    Types: Cigarettes  . Smokeless tobacco: Never Used  Substance and Sexual Activity  . Alcohol use: Yes    Comment: Pt drinks daily . unknown amount pt. reports "about 5-6 40 oz beers daily over last week  . Drug use: Yes    Types: Benzodiazepines    Comment: pt. reports "alprazolam 0.5 TID  . Sexual activity: Not Currently  Lifestyle  . Physical activity:    Days per week: Not on file    Minutes per session: Not on file  . Stress: Not on file  Relationships  . Social connections:    Talks on phone: Not on file    Gets together: Not on file    Attends religious service: Not on file    Active member of club or organization:  Not on file    Attends meetings of clubs or organizations: Not on file    Relationship status: Not on file  Other Topics Concern  . 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  Has daughter    Hospital Course:  From admission H&P 02/15/2019: Patient is a 58 year old male with a past psychiatric history significant for alcohol dependence as well as benzodiazepine dependence who presented to the [facility] emergency department on 3/20 who originally presented for concern for alcohol withdrawal, but was found to have a metabolic acidosis that was likely alcoholic ketoacidosis. He was admitted to the medical hospital on 02/11/2019. He admitted at that time that  he had been living at some assisted living facility approximately 3 weeks ago, but left with "a friend". He started drinking at that time. He has been increasing his alcohol intake over the last 3 weeks. Psychiatric consultation was obtained on 3/20. He admitted that time he had been at admitted to assisted living facility after he fell when intoxicated and broke his foot. He was not able to receive Xanax while in the facility so he decided to leave. He immediately started drinking at that time. Home medications appeared to be Xanax 0.5 mg p.o. 4 times daily, Elavil 25 mg p.o. nightly, Tegretol 100 mg p.o. 3 times daily, Lexapro 10 mg p.o. daily, gabapentin 300 mg p.o. 3 times daily, Remeron 15 mg p.o. nightly, trazodone 50 mg p.o. nightly as needed insomnia and Effexor extended release 150 mg p.o. daily. He was apparently restarted on Xanax and gabapentin in the hospital. Review of the PMP database revealed his last Xanax prescription was on 01/24/2403 120 0.5 mg tablets. This was from a provider in [place]. Prior to that his last Xanax prescription was on 11/08/2018 again for 120 0.5 mg tablets of Xanax. He was stabilized and transferred to our facility on 02/15/2019. He denied current suicidal ideation. He stated he was depressed. He also stated that he was very anxious and wanted to get back on his medications. He was admitted to the hospital for detox, and unfortunately he is currently homeless and he is seeking either return to his assisted living facility or rehab.  Mr. Parson was admitted for alcohol withdrawal and depression. CIWA protocol was initiated with Ativan 1 mg PO PRN CIWA>10. Effexor and Xanax were stopped. He was continued on Lexapro, Tegretol, Neurontin, folic acid and thiamine.  Remeron and PRN Vistaril were started. He is discharging to an [facility]. He remained on the [facility] unit for 3 days. He stabilized with medication. He was discharged on the medications listed below. He  has shown improvement with improved mood, affect, sleep, appetite, and interaction. He denies any SI/HI/AVH and contracts for safety. He agrees to follow up at [facility] (see below). Patient is provided with prescriptions for medications upon discharge. He is leaving with taxi voucher to an [address] in [place].  Physical Findings: AIMS: Facial and Oral Movements Muscles of Facial Expression: None, normal Lips and Perioral Area: None, normal Jaw: None, normal Tongue: None, normal,Extremity Movements Upper (arms, wrists, hands, fingers): None, normal Lower (legs, knees, ankles, toes): None, normal, Trunk Movements Neck, shoulders, hips: None, normal, Overall Severity Severity of abnormal movements (highest score from questions above): None, normal Incapacitation due to abnormal movements: None, normal Patient's awareness of abnormal movements (rate only patient's report): No Awareness, Dental Status Current problems with teeth and/or dentures?: No Does patient  usually wear dentures?: No  CIWA:  CIWA-Ar Total: 1 COWS:  COWS Total Score: 1  Musculoskeletal: Strength & Muscle Tone: within normal limits Gait & Station: steady Patient leans: N/A  Psychiatric Specialty Exam: Physical Exam  Nursing note and vitals reviewed. Constitutional: He is oriented to person, place, and time. He appears well-developed and well-nourished.  Cardiovascular: Normal rate.  Respiratory: Effort normal.  Neurological: He is alert and oriented to person, place, and time.    Review of Systems  Constitutional: Negative.   Psychiatric/Behavioral: Positive for depression (improving) and substance abuse (ETOH). Negative for hallucinations, memory loss and suicidal ideas. The patient is not nervous/anxious and does not have insomnia.     Blood pressure 120/88, pulse 79, temperature 98.6 F (37 C), temperature source Oral, resp. rate 20, height 5\' 8"  (1.727 m), weight 89.4 kg, SpO2 96 %.Body mass index is  29.95 kg/m.  See MD's discharge SRA     Have you used any form of tobacco in the last 30 days? (Cigarettes, Smokeless Tobacco, Cigars, and/or Pipes): Yes  Has this patient used any form of tobacco in the last 30 days? (Cigarettes, Smokeless Tobacco, Cigars, and/or Pipes) Yes, a prescription for an FDA-approved medication for tobacco cessation was offered at discharge.   Blood Alcohol level:  Lab Results  Component Value Date   ETH 21 (H) 02/10/2019   ETH 316 (H) 11/26/2012    Metabolic Disorder Labs:  Lab Results  Component Value Date   HGBA1C 5.8 (H) 11/13/2017   MPG 119.76 11/13/2017   MPG 123 06/14/2017   No results found for: PROLACTIN Lab Results  Component Value Date   CHOL 146 11/13/2017   TRIG 237 (H) 11/13/2017   HDL 82 11/13/2017   CHOLHDL 1.8 11/13/2017   VLDL 47 (H) 11/13/2017   LDLCALC 17 11/13/2017   LDLCALC 47 06/14/2017    See Psychiatric Specialty Exam and Suicide Risk Assessment completed by Attending Physician prior to discharge.  Discharge destination:  Home  Is patient on multiple antipsychotic therapies at discharge:  No   Has Patient had three or more failed trials of antipsychotic monotherapy by history:  No  Recommended Plan for Multiple Antipsychotic Therapies: NA  Discharge Instructions    Discharge instructions   Complete by:  As directed    Patient is instructed to take all prescribed medications as recommended. Report any side effects or adverse reactions to your outpatient psychiatrist. Patient is instructed to abstain from alcohol and illegal drugs while on prescription medications. In the event of worsening symptoms, patient is instructed to call the crisis hotline, 911, or go to the nearest emergency department for evaluation and treatment.     Allergies as of 02/17/2019   No Known Allergies     Medication List    STOP taking these medications   ALPRAZolam 0.5 MG tablet Commonly known as:  XANAX     TAKE these  medications     Indication  albuterol 108 (90 Base) MCG/ACT inhaler Commonly known as:  PROVENTIL HFA;VENTOLIN HFA Inhale 2 puffs into the lungs every 6 (six) hours as needed for wheezing or shortness of breath.  Indication:  Asthma, SOB   atorvastatin 20 MG tablet Commonly known as:  LIPITOR Take 20 mg by mouth daily.  Indication:  High Amount of Fats in the Blood   carbamazepine 100 MG chewable tablet Commonly known as:  TEGRETOL Chew 100 mg by mouth 3 (three) times daily.  Indication:  Alcohol Withdrawal Syndrome   escitalopram  10 MG tablet Commonly known as:  LEXAPRO Take 1 tablet (10 mg total) by mouth daily. For mood What changed:  additional instructions  Indication:  Mood   folic acid 1 MG tablet Commonly known as:  FOLVITE Take 1 tablet (1 mg total) by mouth daily.  Indication:  Supplementation   gabapentin 300 MG capsule Commonly known as:  NEURONTIN Take 300 mg by mouth 3 (three) times daily.  Indication:  Pain   hydrOXYzine 25 MG tablet Commonly known as:  ATARAX/VISTARIL Take 1 tablet (25 mg total) by mouth 3 (three) times daily as needed for anxiety.  Indication:  Feeling Anxious   mirtazapine 7.5 MG tablet Commonly known as:  REMERON Take 1 tablet (7.5 mg total) by mouth at bedtime. For mood/sleep  Indication:  Mood   multivitamin with minerals Tabs tablet Take 1 tablet by mouth daily.  Indication:  Supplementation   nicotine 21 mg/24hr patch Commonly known as:  NICODERM CQ - dosed in mg/24 hours Place 1 patch (21 mg total) onto the skin daily. For smoking cessation Start taking on:  February 18, 2019  Indication:  Nicotine Addiction   ondansetron 8 MG disintegrating tablet Commonly known as:  ZOFRAN-ODT Take 1 tablet (8 mg total) by mouth every 8 (eight) hours as needed for nausea or vomiting.  Indication:  Nausea and Vomiting   thiamine 100 MG tablet Take 1 tablet (100 mg total) by mouth daily.  Indication:  Supplementation      Follow-up  Information    [facility] Follow up on 02/21/2019.   Why:  Hospital follow up appointment is Monday, 3/30 at 9:30a.  At this time your appointment will be conducted over the telephone.  Contact information: [address] [place] [address] [phone]           Follow-up recommendations: Activity as tolerated. Diet as recommended by primary care physician. Keep all scheduled follow-up appointments as recommended.   Comments:   Patient is instructed to take all prescribed medications as recommended. Report any side effects or adverse reactions to your outpatient psychiatrist. Patient is instructed to abstain from alcohol and illegal drugs while on prescription medications. In the event of worsening symptoms, patient is instructed to call the crisis hotline, 911, or go to the nearest emergency department for evaluation and treatment.  Signed: Aldean Baker, NP 02/17/2019, 12:49 PM

## 2019-02-17 NOTE — Patient Outreach (Signed)
[facility]) Care Management  02/17/2019  Jason Bean [birth year] [id number]   Medication Adherence call to Jason Bean patients telephone number is disconnected patient is showing due on Atorvastatin 20 mg under [facility] Ins.   [name] CPhT Pharmacy Technician [facility] Care Management Direct Dial [phone]  Fax [phone] [email or web address]

## 2019-02-17 NOTE — Plan of Care (Signed)
  Problem: Education: Goal: Emotional status will improve 02/17/2019 0849 by Dewayne Shorter, RN Outcome: Adequate for Discharge 02/17/2019 0847 by Dewayne Shorter, RN Outcome: Adequate for Discharge   Problem: Education: Goal: Mental status will improve 02/17/2019 0849 by Dewayne Shorter, RN Outcome: Adequate for Discharge 02/17/2019 0847 by Dewayne Shorter, RN Outcome: Adequate for Discharge   Problem: Education: Goal: Verbalization of understanding the information provided will improve 02/17/2019 0849 by Dewayne Shorter, RN Outcome: Adequate for Discharge 02/17/2019 0847 by Dewayne Shorter, RN Outcome: Adequate for Discharge

## 2019-02-17 NOTE — [facility] Suicide Risk Assessment (Signed)
[facility] Discharge Suicide Risk Assessment   Principal Problem: Uncomplicated alcohol withdrawal without perceptual disturbances (HCC) Discharge Diagnoses: Principal Problem:   Uncomplicated alcohol withdrawal without perceptual disturbances (HCC) Active Problems:   Substance induced mood disorder (HCC)   Hypertension   Hyperlipidemia   Neck pain, chronic   Total Time spent with patient: 20 minutes  Musculoskeletal: Strength & Muscle Tone: within normal limits Gait & Station: ataxic Patient leans: N/A  Psychiatric Specialty Exam: Review of Systems  All other systems reviewed and are negative.   Blood pressure 120/88, pulse 79, temperature 98.6 F (37 C), temperature source Oral, resp. rate 20, height 5\' 8"  (1.727 m), weight 89.4 kg, SpO2 96 %.Body mass index is 29.95 kg/m.  General Appearance: Casual  Eye Contact::  Fair  Speech:  Normal Rate409  Volume:  Normal  Mood:  Euthymic  Affect:  Congruent  Thought Process:  Coherent and Descriptions of Associations: Intact  Orientation:  Full (Time, Place, and Person)  Thought Content:  Logical  Suicidal Thoughts:  No  Homicidal Thoughts:  No  Memory:  Immediate;   Fair Recent;   Fair Remote;   Fair  Judgement:  Intact  Insight:  Fair  Psychomotor Activity:  Normal  Concentration:  Fair  Recall:  [employer] of Knowledge:Fair  Language: Fair  Akathisia:  Negative  Handed:  Right  AIMS (if indicated):     Assets:  Desire for Improvement Resilience  Sleep:  Number of Hours: 6.75  Cognition: WNL  ADL's:  Intact   Mental Status Per Nursing Assessment::   On Admission:  Self-harm thoughts  Demographic Factors:  Male, Divorced or widowed, Caucasian, Low socioeconomic status and Unemployed  Loss Factors: NA  Historical Factors: Impulsivity  Risk Reduction Factors:   NA  Continued Clinical Symptoms:  Depression:   Comorbid alcohol abuse/dependence Impulsivity Alcohol/Substance Abuse/Dependencies  Cognitive  Features That Contribute To Risk:  None    Suicide Risk:  Minimal: No identifiable suicidal ideation.  Patients presenting with no risk factors but with morbid ruminations; may be classified as minimal risk based on the severity of the depressive symptoms  Follow-up Information    [facility] Follow up.   Contact information: [address] [place] [address] [phone]           Plan Of Care/Follow-up recommendations:  Activity:  ad lib  Antonieta Pert, MD 02/17/2019, 9:09 AM

## 2019-02-18 DIAGNOSIS — R799 Abnormal finding of blood chemistry, unspecified: Secondary | ICD-10-CM | POA: Diagnosis not present

## 2019-02-18 DIAGNOSIS — R319 Hematuria, unspecified: Secondary | ICD-10-CM | POA: Diagnosis not present

## 2019-02-22 ENCOUNTER — Other Ambulatory Visit: Payer: Self-pay

## 2019-02-22 ENCOUNTER — Emergency Department (HOSPITAL_COMMUNITY)
Admission: EM | Admit: 2019-02-22 | Discharge: 2019-02-23 | Disposition: A | Discharge status: Home / Self Care | Payer: Medicare Other | Attending: Emergency Medicine | Admitting: Emergency Medicine

## 2019-02-22 ENCOUNTER — Encounter (HOSPITAL_COMMUNITY): Payer: Self-pay

## 2019-02-22 DIAGNOSIS — F10920 Alcohol use, unspecified with intoxication, uncomplicated: Secondary | ICD-10-CM

## 2019-02-22 DIAGNOSIS — F17210 Nicotine dependence, cigarettes, uncomplicated: Secondary | ICD-10-CM | POA: Diagnosis not present

## 2019-02-22 DIAGNOSIS — R45851 Suicidal ideations: Secondary | ICD-10-CM | POA: Diagnosis not present

## 2019-02-22 DIAGNOSIS — Z79899 Other long term (current) drug therapy: Secondary | ICD-10-CM | POA: Diagnosis not present

## 2019-02-22 DIAGNOSIS — F10129 Alcohol abuse with intoxication, unspecified: Secondary | ICD-10-CM | POA: Insufficient documentation

## 2019-02-22 DIAGNOSIS — F319 Bipolar disorder, unspecified: Secondary | ICD-10-CM | POA: Insufficient documentation

## 2019-02-22 DIAGNOSIS — F322 Major depressive disorder, single episode, severe without psychotic features: Secondary | ICD-10-CM | POA: Diagnosis present

## 2019-02-22 DIAGNOSIS — I10 Essential (primary) hypertension: Secondary | ICD-10-CM | POA: Insufficient documentation

## 2019-02-22 DIAGNOSIS — R509 Fever, unspecified: Secondary | ICD-10-CM | POA: Diagnosis not present

## 2019-02-22 DIAGNOSIS — E860 Dehydration: Secondary | ICD-10-CM | POA: Insufficient documentation

## 2019-02-22 DIAGNOSIS — R51 Headache: Secondary | ICD-10-CM | POA: Diagnosis not present

## 2019-02-22 DIAGNOSIS — J449 Chronic obstructive pulmonary disease, unspecified: Secondary | ICD-10-CM | POA: Insufficient documentation

## 2019-02-22 DIAGNOSIS — Y903 Blood alcohol level of 60-79 mg/100 ml: Secondary | ICD-10-CM | POA: Insufficient documentation

## 2019-02-22 DIAGNOSIS — F1010 Alcohol abuse, uncomplicated: Secondary | ICD-10-CM

## 2019-02-22 DIAGNOSIS — R112 Nausea with vomiting, unspecified: Secondary | ICD-10-CM | POA: Diagnosis present

## 2019-02-22 LAB — COMPREHENSIVE METABOLIC PANEL
ALT: 30 U/L (ref 0–44)
AST: 26 U/L (ref 15–41)
Albumin: 4.1 g/dL (ref 3.5–5.0)
Alkaline Phosphatase: 121 U/L (ref 38–126)
Anion gap: 13 (ref 5–15)
BUN: 8 mg/dL (ref 6–20)
CO2: 24 mmol/L (ref 22–32)
Calcium: 8.9 mg/dL (ref 8.9–10.3)
Chloride: 102 mmol/L (ref 98–111)
Creatinine, Ser: 0.74 mg/dL (ref 0.61–1.24)
GFR calc Af Amer: >60 mL/min (ref >60)
GFR calc non Af Amer: >60 mL/min (ref >60)
Glucose, Bld: 94 mg/dL (ref 70–99)
Potassium: 3.8 mmol/L (ref 3.5–5.1)
Sodium: 139 mmol/L (ref 135–145)
Total Bilirubin: 0.4 mg/dL (ref 0.3–1.2)
Total Protein: 7.6 g/dL (ref 6.5–8.1)

## 2019-02-22 LAB — URINALYSIS, ROUTINE W REFLEX MICROSCOPIC
Bilirubin Urine: NEGATIVE
Glucose, UA: NEGATIVE mg/dL
Ketones, ur: 5 mg/dL — AB
Leukocytes,Ua: NEGATIVE
Nitrite: NEGATIVE
Protein, ur: 30 mg/dL — AB
Specific Gravity, Urine: 1.016 (ref 1.005–1.030)
pH: 5 (ref 5.0–8.0)

## 2019-02-22 LAB — CBC WITH DIFFERENTIAL/PLATELET
Abs Immature Granulocytes: 0.03 10*3/uL (ref 0.00–0.07)
Basophils Absolute: 0.1 10*3/uL (ref 0.0–0.1)
Basophils Relative: 1 %
Eosinophils Absolute: 0.1 10*3/uL (ref 0.0–0.5)
Eosinophils Relative: 1 %
HCT: 46.1 % (ref 39.0–52.0)
Hemoglobin: 14.6 g/dL (ref 13.0–17.0)
Immature Granulocytes: 0 %
LYMPHS PCT: 23 %
Lymphs Abs: 2.5 10*3/uL (ref 0.7–4.0)
MCH: 30 pg (ref 26.0–34.0)
MCHC: 31.7 g/dL (ref 30.0–36.0)
MCV: 94.9 fL (ref 80.0–100.0)
Monocytes Absolute: 0.8 10*3/uL (ref 0.1–1.0)
Monocytes Relative: 8 %
Neutro Abs: 7.4 10*3/uL (ref 1.7–7.7)
Neutrophils Relative %: 67 %
Platelets: 340 10*3/uL (ref 150–400)
RBC: 4.86 MIL/uL (ref 4.22–5.81)
RDW: 13.3 % (ref 11.5–15.5)
WBC: 11 10*3/uL — AB (ref 4.0–10.5)
nRBC: 0 % (ref 0.0–0.2)

## 2019-02-22 LAB — RAPID URINE DRUG SCREEN, HOSP PERFORMED
Amphetamines: NOT DETECTED
Barbiturates: NOT DETECTED
Benzodiazepines: POSITIVE — AB
Cocaine: POSITIVE — AB
Opiates: NOT DETECTED
Tetrahydrocannabinol: NOT DETECTED

## 2019-02-22 LAB — SALICYLATE LEVEL: Salicylate Lvl: <7 mg/dL (ref 2.8–30.0)

## 2019-02-22 LAB — ETHANOL: Alcohol, Ethyl (B): 79 mg/dL — ABNORMAL HIGH (ref <10)

## 2019-02-22 LAB — ACETAMINOPHEN LEVEL

## 2019-02-22 MED ORDER — LORAZEPAM 1 MG PO TABS: 0.0000 mg | ORAL_TABLET | Freq: Two times a day (BID) | ORAL | Status: DC

## 2019-02-22 MED ORDER — LORAZEPAM 2 MG/ML IJ SOLN
0.0000 mg | Freq: Four times a day (QID) | INTRAMUSCULAR | Status: DC
  Administered 2019-02-22: 2 mg via INTRAVENOUS
  Filled 2019-02-22: qty 1

## 2019-02-22 MED ORDER — VITAMIN B-1 100 MG PO TABS
100.0000 mg | ORAL_TABLET | Freq: Every day | ORAL | Status: DC
  Administered 2019-02-23: 100 mg via ORAL
  Filled 2019-02-22: qty 1

## 2019-02-22 MED ORDER — ONDANSETRON HCL 4 MG/2ML IJ SOLN
4.0000 mg | Freq: Once | INTRAMUSCULAR | Status: AC
  Administered 2019-02-22: 4 mg via INTRAVENOUS
  Filled 2019-02-22: qty 2

## 2019-02-22 MED ORDER — LORAZEPAM 2 MG/ML IJ SOLN: 0.0000 mg | Freq: Two times a day (BID) | INTRAMUSCULAR | Status: DC

## 2019-02-22 MED ORDER — LORAZEPAM 1 MG PO TABS
0.0000 mg | ORAL_TABLET | Freq: Four times a day (QID) | ORAL | Status: DC
  Administered 2019-02-23: 2 mg via ORAL
  Filled 2019-02-22: qty 2

## 2019-02-22 MED ORDER — SODIUM CHLORIDE 0.9 % IV BOLUS
1000.0000 mL | Freq: Once | INTRAVENOUS | Status: AC
  Administered 2019-02-22: 1000 mL via INTRAVENOUS

## 2019-02-22 MED ORDER — THIAMINE HCL 100 MG/ML IJ SOLN
100.0000 mg | Freq: Every day | INTRAMUSCULAR | Status: DC
  Administered 2019-02-22: 100 mg via INTRAVENOUS
  Filled 2019-02-22: qty 2

## 2019-02-22 NOTE — ED Provider Notes (Addendum)
[facility]-EMERGENCY DEPT Provider Note   CSN: [id number] Arrival date & time: 02/22/19  1754    History   Chief Complaint Chief Complaint  Patient presents with  . Alcohol Intoxication  . Suicidal  . Headache    HPI Jason Bean is a 58 y.o. male.     The history is provided by the patient and medical records. No language interpreter was used.  Alcohol Intoxication  This is a chronic problem. The current episode started yesterday. The problem occurs constantly. The problem has not changed since onset.Associated symptoms include headaches. Pertinent negatives include no chest pain, no abdominal pain and no shortness of breath. Nothing aggravates the symptoms. Nothing relieves the symptoms. He has tried nothing for the symptoms.  Headache  Associated symptoms: nausea and vomiting   Associated symptoms: no abdominal pain, no back pain, no congestion, no cough, no diarrhea, no dizziness, no fatigue, no fever, no neck pain, no neck stiffness, no numbness, no photophobia, no seizures and no weakness     Past Medical History:  Diagnosis Date  . Anxiety   . Arthritis   . COPD (chronic obstructive pulmonary disease) (HCC)   . Depression   . Hyperlipidemia   . Hypertension     Patient Active Problem List   Diagnosis Date Noted  . Uncomplicated alcohol withdrawal without perceptual disturbances (HCC)   . MDD (major depressive disorder), recurrent episode (HCC) 02/14/2019  . Alcohol withdrawal (HCC) 02/11/2019  . Substance induced mood disorder (HCC)   . Alcoholic ketoacidosis 02/10/2019  . MDD (major depressive disorder), severe (HCC) 11/22/2017  . Alcohol abuse with intoxication (HCC)   . Severe recurrent major depression without psychotic features (HCC) 06/13/2017  . Alcohol dependence (HCC) 11/27/2012  . GERD (gastroesophageal reflux disease) 02/12/2012  . Hypertension 01/06/2012  . COPD (chronic obstructive pulmonary disease) (HCC) 01/06/2012  .  Hyperlipidemia 01/06/2012  . Anxiety 01/06/2012  . Neck pain, chronic 01/06/2012    Past Surgical History:  Procedure Laterality Date  . BACK SURGERY    . NECK SURGERY    . ORTHOPEDIC SURGERY          Home Medications    Prior to Admission medications   Medication Sig Start Date End Date Taking? Authorizing Provider  albuterol (PROVENTIL HFA;VENTOLIN HFA) 108 (90 Base) MCG/ACT inhaler Inhale 2 puffs into the lungs every 6 (six) hours as needed for wheezing or shortness of breath. 11/26/17   Money, Gerlene Burdock, FNP  atorvastatin (LIPITOR) 20 MG tablet Take 20 mg by mouth daily.    [provider]  carbamazepine (TEGRETOL) 100 MG chewable tablet Chew 100 mg by mouth 3 (three) times daily. 01/14/17   [provider]  escitalopram (LEXAPRO) 10 MG tablet Take 1 tablet (10 mg total) by mouth daily. For mood 02/17/19   Aldean Baker, NP  folic acid (FOLVITE) 1 MG tablet Take 1 tablet (1 mg total) by mouth daily. 02/15/19   Claudean Severance, MD  gabapentin (NEURONTIN) 300 MG capsule Take 300 mg by mouth 3 (three) times daily. 10/02/18   [provider]  hydrOXYzine (ATARAX/VISTARIL) 25 MG tablet Take 1 tablet (25 mg total) by mouth 3 (three) times daily as needed for anxiety. 02/17/19   Aldean Baker, NP  mirtazapine (REMERON) 7.5 MG tablet Take 1 tablet (7.5 mg total) by mouth at bedtime. For mood/sleep 02/17/19   Aldean Baker, NP  Multiple Vitamin (MULTIVITAMIN WITH MINERALS) TABS tablet Take 1 tablet by mouth daily.  02/15/19   Claudean Severance, MD  nicotine (NICODERM CQ - DOSED IN MG/24 HOURS) 21 mg/24hr patch Place 1 patch (21 mg total) onto the skin daily. For smoking cessation 02/18/19   Aldean Baker, NP  ondansetron (ZOFRAN-ODT) 8 MG disintegrating tablet Take 1 tablet (8 mg total) by mouth every 8 (eight) hours as needed for nausea or vomiting. 02/14/19   Claudean Severance, MD  thiamine 100 MG tablet Take 1 tablet (100 mg total) by mouth daily. 02/15/19   Claudean Severance, MD    Family History History reviewed. No pertinent family history.  Social History Social History   Tobacco Use  . Smoking status: Current Every Day Smoker    Packs/day: 1.00    Years: 30.00    Pack years: 30.00    Types: Cigarettes  . Smokeless tobacco: Never Used  Substance Use Topics  . Alcohol use: Yes    Comment: Pt drinks daily . unknown amount pt. reports "about 5-6 40 oz beers daily over last week  . Drug use: Yes    Types: Benzodiazepines    Comment: pt. reports "alprazolam 0.5 TID     Allergies   Patient has no known allergies.   Review of Systems Review of Systems  Constitutional: Positive for diaphoresis. Negative for chills, fatigue and fever.  HENT: Negative for congestion and rhinorrhea.   Eyes: Negative for photophobia and visual disturbance.  Respiratory: Negative for cough, chest tightness, shortness of breath, wheezing and stridor.   Cardiovascular: Negative for chest pain, palpitations and leg swelling.  Gastrointestinal: Positive for nausea and vomiting. Negative for abdominal pain, constipation and diarrhea.  Genitourinary: Negative for enuresis and frequency.  Musculoskeletal: Negative for back pain, neck pain and neck stiffness.  Skin: Negative for rash and wound.  Neurological: Positive for headaches. Negative for dizziness, seizures, speech difficulty, weakness, light-headedness and numbness.  Psychiatric/Behavioral: Negative for agitation.  All other systems reviewed and are negative.    Physical Exam Updated Vital Signs BP (!) 145/83   Pulse 88   Temp 99.1 F (37.3 C) (Oral)   Resp 20   SpO2 98%   Physical Exam Vitals signs and nursing note reviewed.  Constitutional:      General: He is not in acute distress.    Appearance: He is well-developed. He is diaphoretic. He is not ill-appearing or toxic-appearing.  HENT:     Head: Normocephalic and atraumatic.     Mouth/Throat:     Mouth: Mucous membranes are moist.   Eyes:     Extraocular Movements: Extraocular movements intact.     Conjunctiva/sclera: Conjunctivae normal.     Pupils: Pupils are equal, round, and reactive to light. Pupils are equal.     Right eye: Pupil is round and reactive.     Left eye: Pupil is round and reactive.  Neck:     Musculoskeletal: Normal range of motion and neck supple.  Cardiovascular:     Rate and Rhythm: Normal rate and regular rhythm.     Heart sounds: No murmur.  Pulmonary:     Effort: Pulmonary effort is normal. No respiratory distress.     Breath sounds: Normal breath sounds. No wheezing, rhonchi or rales.  Chest:     Chest wall: No tenderness.  Abdominal:     Palpations: Abdomen is soft.     Tenderness: There is no abdominal tenderness.  Musculoskeletal: Normal range of motion.        General: No swelling or tenderness.  Skin:    General: Skin is warm.     Capillary Refill: Capillary refill takes less than 2 seconds.     Findings: No erythema or rash.  Neurological:     Mental Status: He is alert and oriented to person, place, and time.     GCS: GCS eye subscore is 4. GCS verbal subscore is 5. GCS motor subscore is 6.     Cranial Nerves: No cranial nerve deficit, dysarthria or facial asymmetry.     Sensory: No sensory deficit.     Motor: No weakness.  Psychiatric:        Mood and Affect: Mood is anxious.      ED Treatments / Results  Labs (all labs ordered are listed, but only abnormal results are displayed) Labs Reviewed  ETHANOL - Abnormal; Notable for the following components:      Result Value   Alcohol, Ethyl (B) 79 (*)    All other components within normal limits  RAPID URINE DRUG SCREEN, HOSP PERFORMED - Abnormal; Notable for the following components:   Cocaine POSITIVE (*)    Benzodiazepines POSITIVE (*)    All other components within normal limits  CBC WITH DIFFERENTIAL/PLATELET - Abnormal; Notable for the following components:   WBC 11.0 (*)    All other components within  normal limits  ACETAMINOPHEN LEVEL - Abnormal; Notable for the following components:   Acetaminophen (Tylenol), Serum <10 (*)    All other components within normal limits  URINALYSIS, ROUTINE W REFLEX MICROSCOPIC - Abnormal; Notable for the following components:   Hgb urine dipstick MODERATE (*)    Ketones, ur 5 (*)    Protein, ur 30 (*)    Bacteria, UA RARE (*)    All other components within normal limits  COMPREHENSIVE METABOLIC PANEL  SALICYLATE LEVEL    EKG EKG Interpretation  Date/Time:  Tuesday February 22 2019 18:42:40 EDT Ventricular Rate:  85 PR Interval:    QRS Duration: 98 QT Interval:  376 QTC Calculation: 448 R Axis:   68 Text Interpretation:  Sinus rhythm Nonspecific T abnormalities, lateral leads When compared to prior, no significant changes seen.  No STEMI Confirmed by Theda Belfast ([id number]) on 02/22/2019 6:48:34 PM   Radiology No results found.  Procedures Procedures (including critical care time)  Medications Ordered in ED Medications  LORazepam (ATIVAN) injection 0-4 mg (2 mg Intravenous Given 02/22/19 1855)    Or  LORazepam (ATIVAN) tablet 0-4 mg ( Oral See Alternative 02/22/19 1855)  LORazepam (ATIVAN) injection 0-4 mg (has no administration in time range)    Or  LORazepam (ATIVAN) tablet 0-4 mg (has no administration in time range)  thiamine (VITAMIN B-1) tablet 100 mg ( Oral See Alternative 02/22/19 1855)    Or  thiamine (B-1) injection 100 mg (100 mg Intravenous Given 02/22/19 1855)  albuterol (PROVENTIL HFA;VENTOLIN HFA) 108 (90 Base) MCG/ACT inhaler 2 puff (has no administration in time range)  atorvastatin (LIPITOR) tablet 20 mg (has no administration in time range)  carbamazepine (TEGRETOL) chewable tablet 100 mg (has no administration in time range)  escitalopram (LEXAPRO) tablet 10 mg (has no administration in time range)  folic acid (FOLVITE) tablet 1 mg (has no administration in time range)  gabapentin (NEURONTIN) capsule 300 mg (has no  administration in time range)  hydrOXYzine (ATARAX/VISTARIL) tablet 25 mg (has no administration in time range)  mirtazapine (REMERON) tablet 7.5 mg (has no administration in time range)  multivitamin with minerals tablet 1 tablet (has no administration in  time range)  ondansetron (ZOFRAN) injection 4 mg (4 mg Intravenous Given 02/22/19 1854)  sodium chloride 0.9 % bolus 1,000 mL (1,000 mLs Intravenous New Bag/Given 02/22/19 1854)     Initial Impression / Assessment and Plan / ED Course  I have reviewed the triage vital signs and the nursing notes.  Pertinent labs & imaging results that were available during my care of the patient were reviewed by me and considered in my medical decision making (see chart for details).        Jason Bean is a 58 y.o. male with a past medical history significant hypertension, hyperlipidemia, anxiety, GERD, COPD, alcohol dependence, and prior alcohol withdrawal seizures who presents with suicidal ideation and concern for alcohol withdrawal.  Patient reports that he was admitted several weeks ago for 6 days for alcohol withdrawal symptoms.  He says that after discharge she shortly began drinking alcohol again.  He reports he was drinking normally and his last drink was yesterday.  He reports that today he has having suicidal ideation again with a plan to overdose on drugs and pills.  He denies any homicidal ideation.  He does report having tactile hallucinations, nausea, vomiting, diaphoresis, agitation, and tremors.  He feels his heart racing but denies chest pain or shortness of breath.  He denies any fevers, chills, urinary symptoms, constipation, or diarrhea.  He denies any sick contacts.  He is primarily concerned about his suicidal ideation, his alcohol abuse, and his pending withdrawals.  He has not taken any medicines for the last week by report.  On my history gathering, he denies any headache, vision changes, neck pain, or neck stiffness.  On exam,  patient is tremulous.  Patient is slightly diaphoretic but is not tachycardic.  Patient is anxious.  Patient's lungs are clear.  No systolic murmur appreciated.  Abdomen and chest are nontender.  Legs are nontender.  Patient has no focal neurologic deficits on my exam aside from the tremors.  No evidence of trauma on initial external exam.  Clinically I am concerned about the patient suicidal ideation with a plan to kill himself today.  I am also concerned about his report of developing alcohol withdrawal and history of alcohol withdrawal seizure.  Patient will have [place] protocol initiated and will be given Ativan.  He will be given Zofran for his nausea and vomiting.  He will be given fluids for rehydration.  Will check screening laboratory testing to look for significantl electrolyte imbalance.  Will also get screen laboratory testing for medical clearance for psychiatric evaluation.  Anticipate TTS consult when CIWA score is not significantly elevated and patient is medically cleared.     11:21 PM After Ativan, patient is feeling much better.  He has resolution of his tachycardia, he is no longer having tactile hallucinations, he is not as fidgety, and he is resting comfortably.  His laboratory testing shows alcohol of 79, do not think he is in impending seizure withdrawals at this time.  His metabolic panel was reassuring.  CBC shows mild leukocytosis but no anemia.  No headaches and Tylenol and salicylate levels normal.   Clinically I feel patient is now medically cleared for psychiatric evaluation.  Urine is still in process however given his lack of any urinary symptoms, low suspicion for infection at this time.   UA shows no infection.    Patient is medically cleared.   Given his report of wanting to kill himself with a plan to overdose, TTS will be called  for recommendations.  12:32 AM TTS reports that patient is appropriate for  inpatient management.  Anticipate  admission.   Final Clinical Impressions(s) / ED Diagnoses   Final diagnoses:  Suicidal ideation  Alcohol abuse  Acute alcoholic intoxication without complication (HCC)    Clinical Impression: 1. Suicidal ideation   2. Alcohol abuse   3. Acute alcoholic intoxication without complication [facility])     Disposition: Patient awaiting psychiatric recommendations.  This note was prepared with assistance of Conservation officer, historic buildings. Occasional wrong-word or sound-a-like substitutions may have occurred due to the inherent limitations of voice recognition software.     Tegeler, Canary Brim, MD 02/23/19 0004    Tegeler, Canary Brim, MD 02/23/19 [phone]

## 2019-02-22 NOTE — ED Notes (Signed)
TTS machine at bedside. 

## 2019-02-22 NOTE — ED Notes (Signed)
Urine and culture sent to lab  

## 2019-02-22 NOTE — ED Triage Notes (Signed)
EMS reports called for ETOH withdrawal and SI. Pt non compliant with meds for over a week. Pt c/o headache and tremors from withdrawal.   BP 140/90 HR 87 RR 16 CBG 117 Sp02 96 RA

## 2019-02-22 NOTE — ED Notes (Signed)
Bed: FG18 Expected date:  Expected time:  Means of arrival:  Comments: EMS-fever/ETOH/SI

## 2019-02-23 DIAGNOSIS — F10129 Alcohol abuse with intoxication, unspecified: Secondary | ICD-10-CM | POA: Diagnosis not present

## 2019-02-23 MED ORDER — ESCITALOPRAM OXALATE 10 MG PO TABS
10.0000 mg | ORAL_TABLET | Freq: Every day | ORAL | Status: DC
  Administered 2019-02-23: 10 mg via ORAL
  Filled 2019-02-23: qty 1

## 2019-02-23 MED ORDER — VITAMIN B-1 100 MG PO TABS: 100.0000 mg | ORAL_TABLET | Freq: Every day | ORAL | Status: DC

## 2019-02-23 MED ORDER — HYDROXYZINE HCL 25 MG PO TABS: 25.0000 mg | ORAL_TABLET | Freq: Three times a day (TID) | ORAL | Status: DC | PRN

## 2019-02-23 MED ORDER — GABAPENTIN 300 MG PO CAPS
300.0000 mg | ORAL_CAPSULE | Freq: Three times a day (TID) | ORAL | Status: DC
  Administered 2019-02-23: 300 mg via ORAL
  Filled 2019-02-23: qty 1

## 2019-02-23 MED ORDER — ATORVASTATIN CALCIUM 20 MG PO TABS
20.0000 mg | ORAL_TABLET | Freq: Every day | ORAL | Status: DC
  Filled 2019-02-23: qty 1

## 2019-02-23 MED ORDER — FOLIC ACID 1 MG PO TABS
1.0000 mg | ORAL_TABLET | Freq: Every day | ORAL | Status: DC
  Administered 2019-02-23: 1 mg via ORAL
  Filled 2019-02-23: qty 1

## 2019-02-23 MED ORDER — ALBUTEROL SULFATE HFA 108 (90 BASE) MCG/ACT IN AERS: 2.0000 | INHALATION_SPRAY | Freq: Four times a day (QID) | RESPIRATORY_TRACT | Status: DC | PRN

## 2019-02-23 MED ORDER — MIRTAZAPINE 7.5 MG PO TABS
7.5000 mg | ORAL_TABLET | Freq: Every day | ORAL | Status: DC
  Administered 2019-02-23: 7.5 mg via ORAL
  Filled 2019-02-23: qty 1

## 2019-02-23 MED ORDER — ADULT MULTIVITAMIN W/MINERALS CH
1.0000 | ORAL_TABLET | Freq: Every day | ORAL | Status: DC
  Administered 2019-02-23: 1 via ORAL
  Filled 2019-02-23: qty 1

## 2019-02-23 MED ORDER — CARBAMAZEPINE 100 MG PO CHEW
100.0000 mg | CHEWABLE_TABLET | Freq: Three times a day (TID) | ORAL | Status: DC
  Administered 2019-02-23: 100 mg via ORAL
  Filled 2019-02-23 (×3): qty 1

## 2019-02-23 NOTE — [facility] Assessment (Signed)
[facility] Assessment Progress Note  Per Jacqueline Norman, DO, this pt does not require psychiatric hospitalization at this time.  Pt is to be discharged from [facility] with recommendation to follow up with the [facility].  This has been included in pt's discharge instructions.  Pt would also benefit from seeing Peer Support Specialists; they will be asked to speak to pt.  Pt's nurse has been notified.  Taylon Coole, MA Triage Specialist [phone]     

## 2019-02-23 NOTE — Patient Outreach (Signed)
ED Peer Support Specialist Patient Intake (Complete at intake & 30-60 Day Follow-up)  Name: Jason Bean  MRN: [id number]  Age: 58 y.o.   Date of Admission: 02/23/2019  Intake: Initial Comments:      Primary Reason Admitted: SI, alcohol use.  Lab values: Alcohol/ETOH: Positive Positive UDS? Yes Amphetamines: No Barbiturates: No Benzodiazepines: Yes Cocaine: Yes Opiates: No Cannabinoids: No  Demographic information: Gender: Male Ethnicity: White Marital Status: Single Insurance Status: Medicare(UHC) Ecologist (Work Neurosurgeon, Physicist, medical, Social research officer, government.: Yes(SSI) Lives with: Alone Living situation: Homeless  Reported Patient History: Patient reported health conditions: Other (comment), COPD, Depression, Hypertension(anxiety) Patient aware of HIV and hepatitis status: Yes (comment)(Negative)  In past year, has patient visited ED for any reason? Yes  Number of ED visits: 1  Reason(s) for visit: dehydration  In past year, has patient been hospitalized for any reason? Yes  Number of hospitalizations: 2  Reason(s) for hospitalization: alcohol withdrawals at [facility] on 02/14/19  In past year, has patient been arrested? No  Number of arrests:    Reason(s) for arrest:    In past year, has patient been incarcerated? No  Number of incarcerations:    Reason(s) for incarceration:    In past year, has patient received medication-assisted treatment? No  In past year, patient received the following treatments:    In past year, has patient received any harm reduction services? No  Did this include any of the following?    In past year, has patient received care from a mental health provider for diagnosis other than SUD? No  In past year, is this first time patient has overdosed? (has not overdosed)  Number of past overdoses:    In past year, is this first time patient has been hospitalized for an overdose? (has not overdosed)  Number  of hospitalizations for overdose(s):    Is patient currently receiving treatment for a mental health diagnosis? No  Patient reports experiencing difficulty participating in SUD treatment: No    Most important reason(s) for this difficulty?    Has patient received prior services for treatment? No  In past, patient has received services from following agencies:    Plan of Care:  Suggested follow up at these agencies/treatment centers: (Patient is interested in long-term residential substance use treatment. [facility] will work on those referrals. )  Other information: [facility] met with the patient to provide substance use recovery support and help substance with substance use treatment resources. Currently [facility] and [facility] do not have beds available. [facility] will provide follow up information for the patient for these resources as well as [facility] contact information.     Mason Jim, [facility]  02/23/2019 12:22 PM

## 2019-02-23 NOTE — Progress Notes (Signed)
02/23/2019  0911  Patient belongings in locker #29.

## 2019-02-23 NOTE — ED Notes (Signed)
Bed: WA29 Expected date:  Expected time:  Means of arrival:  Comments: 
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
## 2019-02-23 NOTE — BHH Suicide Risk Assessment (Cosign Needed)
Suicide Risk Assessment  Discharge Assessment   [facility] Discharge Suicide Risk Assessment   Principal Problem: Alcohol abuse with intoxication [facility]) Discharge Diagnoses: Principal Problem:   Alcohol abuse with intoxication (HCC) Active Problems:   MDD (major depressive disorder), severe (HCC)   Total Time spent with patient: 30 minutes  Musculoskeletal: Strength & Muscle Tone: within normal limits Gait & Station: normal Patient leans: N/A  Psychiatric Specialty Exam:   Blood pressure 125/87, pulse 82, temperature 98.3 F (36.8 C), temperature source Oral, resp. rate 18, SpO2 93 %.There is no height or weight on file to calculate BMI.  General Appearance: Casual  Eye Contact::  Good  Speech:  Clear and Coherent and Normal Rate409  Volume:  Normal  Mood:  Depressed  Affect:  Congruent  Thought Process:  Coherent and Descriptions of Associations: Intact  Orientation:  Full (Time, Place, and Person)  Thought Content:  Logical  Suicidal Thoughts:  No  Homicidal Thoughts:  No  Memory:  Immediate;   Good Recent;   Good Remote;   Fair  Judgement:  Good  Insight:  Good  Psychomotor Activity:  Normal  Concentration:  Good  Recall:  Good  Fund of Knowledge:Good  Language: Good  Akathisia:  No  Handed:  Right  AIMS (if indicated):     Assets:  Architect Social Support  Sleep:     Cognition: WNL  ADL's:  Intact   Mental Status Per Nursing Assessment::   On Admission:    Pt was seen and chart reviewed with treatment team and Dr Sharma Covert. Pt denies homicidal ideation, denies auditory/visual hallucinations and does not appear to be responding to internal stimuli. Pt stated he was having some suicidal thoughts but he does not have a plan. Pt was discharged from [facility] on 3/26 with medications and a follow up plan to go to [facility]. Pt is homeless. Pt will be referred to Peer Support and will be given shelter and outpatient resources for follow up  in the community. Pt is psychiatrically clear.   Demographic Factors:  Male, Low socioeconomic status and Unemployed  Loss Factors: Financial problems/change in socioeconomic status  Historical Factors: Family history of mental illness or substance abuse  Risk Reduction Factors:   Sense of responsibility to family  Continued Clinical Symptoms:  Alcohol/Substance Abuse/Dependencies  Cognitive Features That Contribute To Risk:  Closed-mindedness    Suicide Risk:  Minimal: No identifiable suicidal ideation.  Patients presenting with no risk factors but with morbid ruminations; may be classified as minimal risk based on the severity of the depressive symptoms   Plan Of Care/Follow-up recommendations:  Activity:  as tolerated Diet:  heart healthy  Laveda Abbe, NP 02/23/2019, 11:46 AM

## 2019-02-23 NOTE — ED Notes (Signed)
Bed: [facility] Expected date:  Expected time:  Means of arrival:  Comments: Hold for 29

## 2019-02-23 NOTE — Discharge Instructions (Signed)
For your behavioral health needs, you are advised to follow up with [facility].  Contact them at your earliest opportunity to ask about scheduling an intake appointment: ° °     [facility] °     [address] °     [place], [place] [address] °     [phone] °

## 2019-02-23 NOTE — ED Notes (Signed)
Pt changed into burgundy scrubs and all belongings placed in TWO labeled bags and placed in cabinet labeled "patient belongings 16-18, resus  A"

## 2019-02-23 NOTE — ED Notes (Signed)
Patient awaiting taxi voucher from social work at this time and then will be discharged

## 2019-02-23 NOTE — ED Notes (Signed)
All belongings returned to patient. Patient is requesting a cab voucher to get to the homeless shelter. Pt has a broken foot that he was being rehabbed for. Peer support attempting to obtain a cab voucher at this time.  Pt listened to d/c instructions and reports he understands

## 2019-02-27 DIAGNOSIS — R000 Tachycardia, unspecified: Secondary | ICD-10-CM | POA: Diagnosis not present

## 2019-02-27 DIAGNOSIS — R404 Transient alteration of awareness: Secondary | ICD-10-CM | POA: Diagnosis not present

## 2019-02-27 DIAGNOSIS — I10 Essential (primary) hypertension: Secondary | ICD-10-CM | POA: Diagnosis not present

## 2019-02-27 DIAGNOSIS — R410 Disorientation, unspecified: Secondary | ICD-10-CM | POA: Diagnosis not present

## 2019-03-01 DIAGNOSIS — R0902 Hypoxemia: Secondary | ICD-10-CM | POA: Diagnosis not present

## 2019-03-01 DIAGNOSIS — R51 Headache: Secondary | ICD-10-CM | POA: Diagnosis not present

## 2019-03-01 DIAGNOSIS — E119 Type 2 diabetes mellitus without complications: Secondary | ICD-10-CM | POA: Diagnosis not present

## 2019-03-01 DIAGNOSIS — R1084 Generalized abdominal pain: Secondary | ICD-10-CM | POA: Diagnosis not present

## 2019-03-01 DIAGNOSIS — I252 Old myocardial infarction: Secondary | ICD-10-CM | POA: Diagnosis not present

## 2019-03-01 DIAGNOSIS — R0602 Shortness of breath: Secondary | ICD-10-CM | POA: Diagnosis not present

## 2019-03-01 DIAGNOSIS — I10 Essential (primary) hypertension: Secondary | ICD-10-CM | POA: Diagnosis not present

## 2019-03-01 DIAGNOSIS — R079 Chest pain, unspecified: Secondary | ICD-10-CM | POA: Diagnosis not present

## 2019-03-05 DIAGNOSIS — Z79899 Other long term (current) drug therapy: Secondary | ICD-10-CM | POA: Diagnosis not present

## 2019-03-07 ENCOUNTER — Other Ambulatory Visit: Payer: Self-pay

## 2019-03-07 NOTE — Patient Outreach (Signed)
[facility]) Care Management  03/07/2019  ROB NIDIFFER November 09, 1961 [id number]   Medication Adherence call to Jason Bean patient's telephone number under [facility] is disconnected Epic has a number that does not work  Jason Bean is showing past due on Atorvastatin under [employer].   [name] CPhT Pharmacy Technician [facility] Management Direct Dial [phone]  Fax [phone] [name]

## 2019-03-11 DIAGNOSIS — Z79899 Other long term (current) drug therapy: Secondary | ICD-10-CM | POA: Diagnosis not present

## 2019-03-11 DIAGNOSIS — J4530 Mild persistent asthma, uncomplicated: Secondary | ICD-10-CM | POA: Diagnosis not present

## 2019-03-11 DIAGNOSIS — G2581 Restless legs syndrome: Secondary | ICD-10-CM | POA: Diagnosis not present

## 2019-03-11 DIAGNOSIS — J301 Allergic rhinitis due to pollen: Secondary | ICD-10-CM | POA: Diagnosis not present

## 2019-04-04 DIAGNOSIS — G2581 Restless legs syndrome: Secondary | ICD-10-CM | POA: Diagnosis not present

## 2019-04-04 DIAGNOSIS — J4530 Mild persistent asthma, uncomplicated: Secondary | ICD-10-CM | POA: Diagnosis not present

## 2019-04-04 DIAGNOSIS — J301 Allergic rhinitis due to pollen: Secondary | ICD-10-CM | POA: Diagnosis not present

## 2019-04-09 IMAGING — DX PORTABLE RIGHT TIBIA AND FIBULA - 2 VIEW
1 series · 3 of 3 positions shown · non-contrast
Comparison: Plain film dated 01/26/2019.

CLINICAL DATA: Pt states he fell 3 weeks ago and broke his right
tibfib.

EXAM:
PORTABLE RIGHT TIBIA AND FIBULA - 2 VIEW

[Series 1: leg · 0.14mm/px · 3 of 3 slices shown]
[im 1/3]
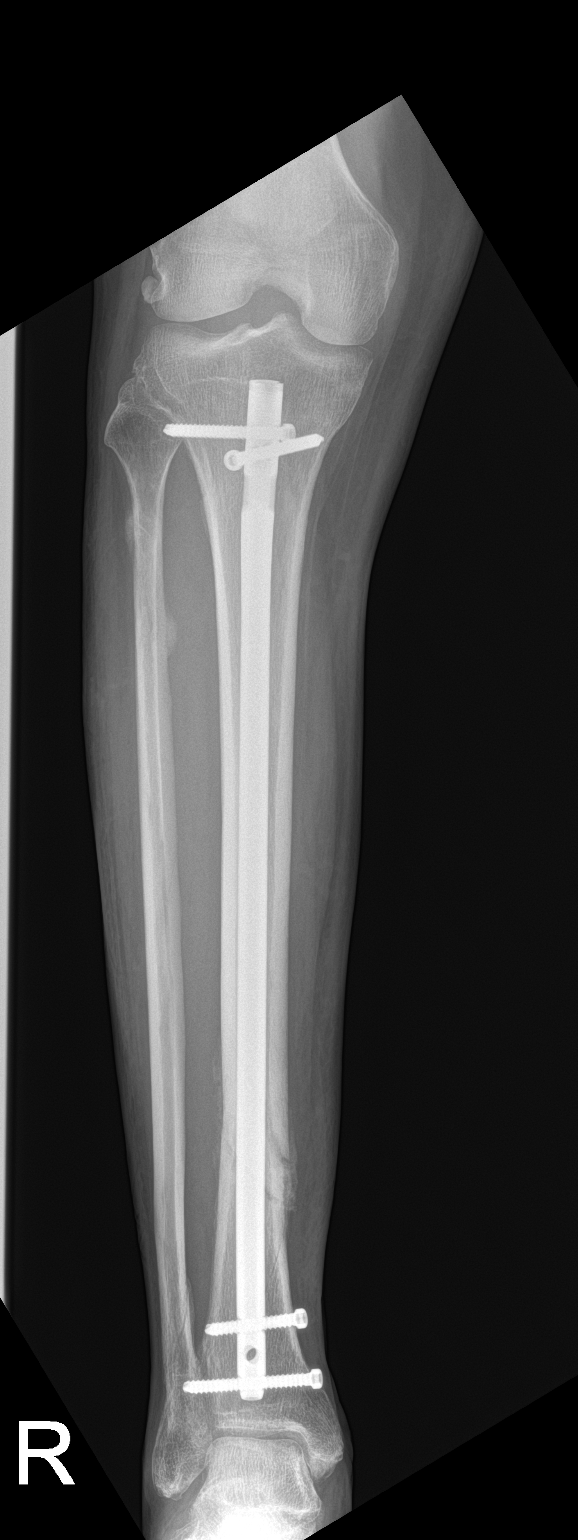
[im 2/3]
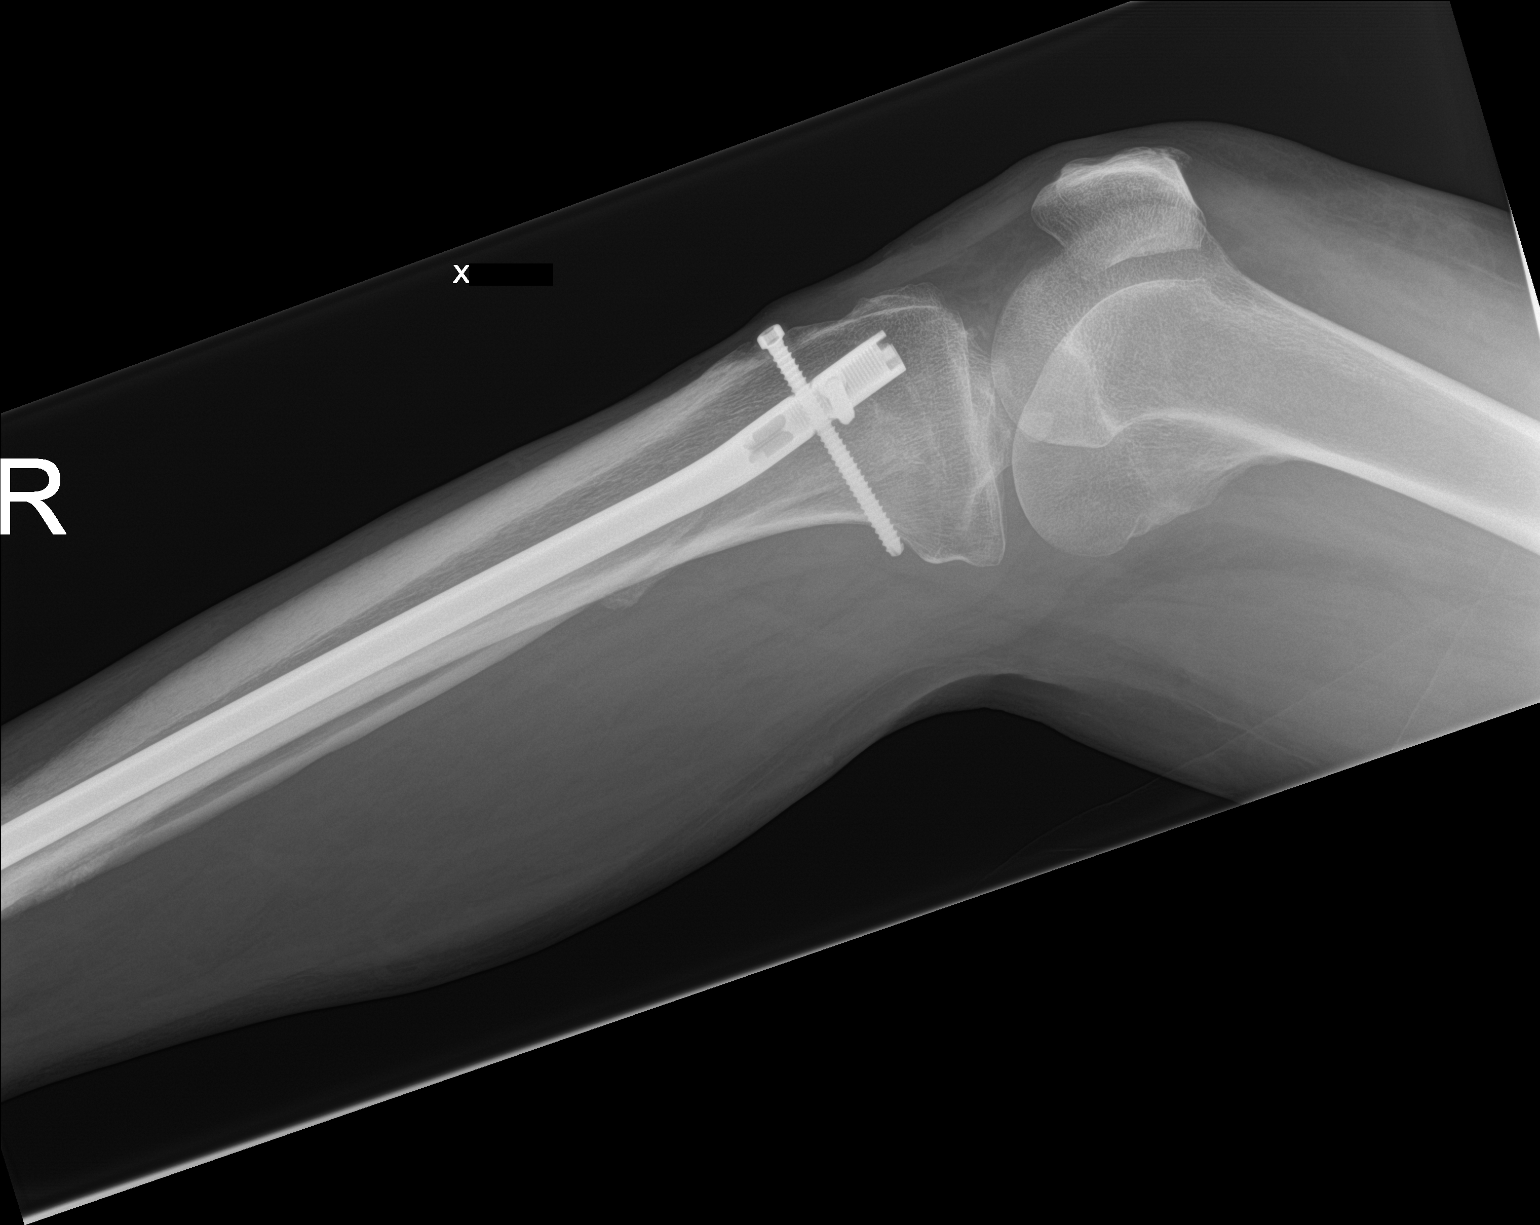
[im 3/3]
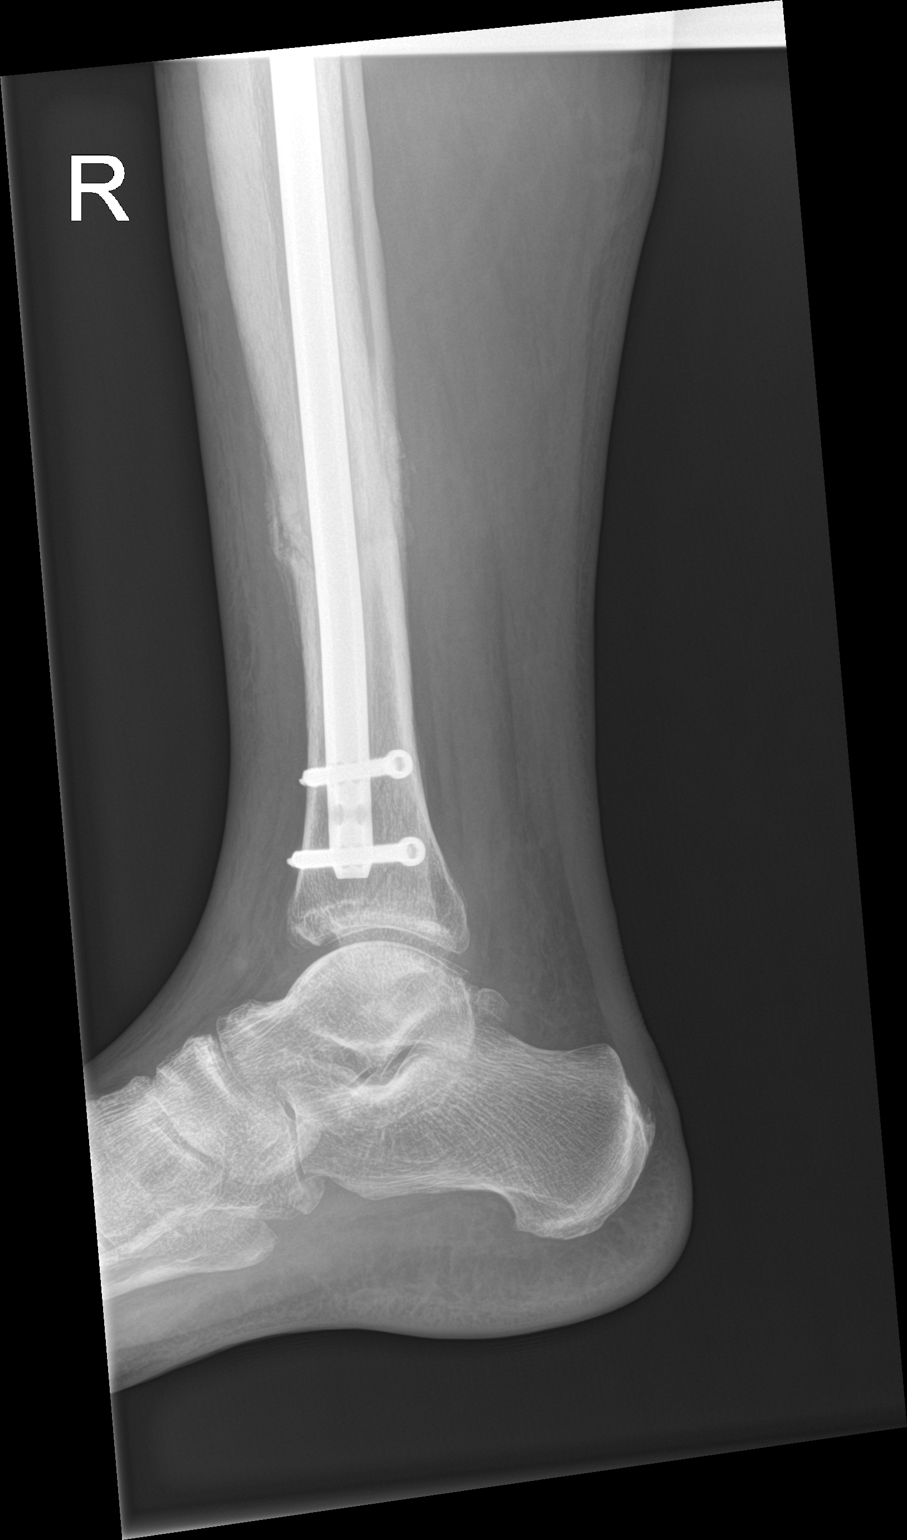

[3 of 3 positions shown; findings below may reference images not displayed]

FINDINGS: Intramedullary rod and screw fixation hardware appears intact and
appropriately positioned within the tibia, traversing the distal
tibial fracture site. Bony alignment appears anatomic. Again noted
are healing nondisplaced fractures of the fibula. No acute appearing
osseous abnormality. Adjacent soft tissues are unremarkable.
IMPRESSION: No acute findings. Fixation hardware appears intact and
appropriately positioned within the tibia. No evidence of surgical
complicating feature.

## 2019-04-11 DIAGNOSIS — E782 Mixed hyperlipidemia: Secondary | ICD-10-CM | POA: Diagnosis not present

## 2019-04-11 DIAGNOSIS — I10 Essential (primary) hypertension: Secondary | ICD-10-CM | POA: Diagnosis not present

## 2019-04-11 DIAGNOSIS — J449 Chronic obstructive pulmonary disease, unspecified: Secondary | ICD-10-CM | POA: Diagnosis not present

## 2019-04-11 DIAGNOSIS — Z09 Encounter for follow-up examination after completed treatment for conditions other than malignant neoplasm: Secondary | ICD-10-CM | POA: Diagnosis not present

## 2019-04-11 DIAGNOSIS — M792 Neuralgia and neuritis, unspecified: Secondary | ICD-10-CM | POA: Diagnosis not present

## 2019-04-12 DIAGNOSIS — S82201D Unspecified fracture of shaft of right tibia, subsequent encounter for closed fracture with routine healing: Secondary | ICD-10-CM | POA: Diagnosis not present

## 2019-05-26 DIAGNOSIS — Z79899 Other long term (current) drug therapy: Secondary | ICD-10-CM | POA: Diagnosis not present

## 2019-05-26 DIAGNOSIS — J4530 Mild persistent asthma, uncomplicated: Secondary | ICD-10-CM | POA: Diagnosis not present

## 2019-05-26 DIAGNOSIS — Z79891 Long term (current) use of opiate analgesic: Secondary | ICD-10-CM | POA: Diagnosis not present

## 2019-05-26 DIAGNOSIS — J301 Allergic rhinitis due to pollen: Secondary | ICD-10-CM | POA: Diagnosis not present

## 2019-05-26 DIAGNOSIS — G2581 Restless legs syndrome: Secondary | ICD-10-CM | POA: Diagnosis not present

## 2019-06-03 DIAGNOSIS — E782 Mixed hyperlipidemia: Secondary | ICD-10-CM | POA: Diagnosis not present

## 2019-06-03 DIAGNOSIS — M25551 Pain in right hip: Secondary | ICD-10-CM | POA: Diagnosis not present

## 2019-06-03 DIAGNOSIS — I10 Essential (primary) hypertension: Secondary | ICD-10-CM | POA: Diagnosis not present

## 2019-06-07 DIAGNOSIS — I10 Essential (primary) hypertension: Secondary | ICD-10-CM | POA: Diagnosis not present

## 2019-06-07 DIAGNOSIS — E782 Mixed hyperlipidemia: Secondary | ICD-10-CM | POA: Diagnosis not present

## 2019-06-07 DIAGNOSIS — R1011 Right upper quadrant pain: Secondary | ICD-10-CM | POA: Diagnosis not present

## 2019-06-15 DIAGNOSIS — R1011 Right upper quadrant pain: Secondary | ICD-10-CM | POA: Diagnosis not present

## 2019-06-20 DIAGNOSIS — R531 Weakness: Secondary | ICD-10-CM | POA: Diagnosis not present

## 2019-06-20 DIAGNOSIS — R0902 Hypoxemia: Secondary | ICD-10-CM | POA: Diagnosis not present

## 2019-06-23 DIAGNOSIS — R0602 Shortness of breath: Secondary | ICD-10-CM | POA: Diagnosis not present
